# Patient Record
Sex: Female | Born: 1965 | Race: Black or African American | Hispanic: No | Marital: Single | Smoking: Never smoker
Health system: Southern US, Community
[De-identification: ages and names within clinical notes are randomized; demographics above are authoritative.]

## PROBLEM LIST (undated history)

## (undated) ENCOUNTER — Ambulatory Visit (HOSPITAL_COMMUNITY): Discharge status: Absent without leave | Payer: Medicaid Other

## (undated) DIAGNOSIS — E119 Type 2 diabetes mellitus without complications: Secondary | ICD-10-CM

## (undated) DIAGNOSIS — R079 Chest pain, unspecified: Secondary | ICD-10-CM

## (undated) DIAGNOSIS — N809 Endometriosis, unspecified: Secondary | ICD-10-CM

## (undated) DIAGNOSIS — K219 Gastro-esophageal reflux disease without esophagitis: Secondary | ICD-10-CM

## (undated) DIAGNOSIS — I10 Essential (primary) hypertension: Secondary | ICD-10-CM

## (undated) DIAGNOSIS — G43909 Migraine, unspecified, not intractable, without status migrainosus: Secondary | ICD-10-CM

## (undated) HISTORY — DX: Endometriosis, unspecified: N80.9

## (undated) HISTORY — DX: Migraine, unspecified, not intractable, without status migrainosus: G43.909

---

## 1997-05-29 ENCOUNTER — Ambulatory Visit (HOSPITAL_COMMUNITY): Admission: RE | Admit: 1997-05-29 | Discharge: 1997-05-29 | Payer: Self-pay | Admitting: *Deleted

## 1997-06-27 ENCOUNTER — Ambulatory Visit (HOSPITAL_COMMUNITY): Admission: RE | Admit: 1997-06-27 | Discharge: 1997-06-27 | Payer: Self-pay | Admitting: *Deleted

## 1997-08-16 ENCOUNTER — Inpatient Hospital Stay (HOSPITAL_COMMUNITY): Admission: AD | Admit: 1997-08-16 | Discharge: 1997-08-16 | Payer: Self-pay | Admitting: *Deleted

## 1997-09-04 ENCOUNTER — Inpatient Hospital Stay (HOSPITAL_COMMUNITY): Admission: AD | Admit: 1997-09-04 | Discharge: 1997-09-06 | Payer: Self-pay | Admitting: *Deleted

## 1999-05-12 ENCOUNTER — Encounter (INDEPENDENT_AMBULATORY_CARE_PROVIDER_SITE_OTHER): Payer: Self-pay | Admitting: *Deleted

## 1999-05-12 ENCOUNTER — Ambulatory Visit (HOSPITAL_BASED_OUTPATIENT_CLINIC_OR_DEPARTMENT_OTHER): Admission: RE | Admit: 1999-05-12 | Discharge: 1999-05-12 | Payer: Self-pay | Admitting: General Surgery

## 2000-06-29 ENCOUNTER — Encounter: Admission: RE | Admit: 2000-06-29 | Discharge: 2000-06-29 | Payer: Self-pay | Admitting: Family Medicine

## 2000-06-29 ENCOUNTER — Encounter: Payer: Self-pay | Admitting: Family Medicine

## 2000-07-06 ENCOUNTER — Inpatient Hospital Stay (HOSPITAL_COMMUNITY): Admission: AD | Admit: 2000-07-06 | Discharge: 2000-07-06 | Payer: Self-pay | Admitting: *Deleted

## 2000-10-11 ENCOUNTER — Inpatient Hospital Stay (HOSPITAL_COMMUNITY): Admission: AD | Admit: 2000-10-11 | Discharge: 2000-10-11 | Payer: Self-pay | Admitting: Obstetrics & Gynecology

## 2000-10-22 ENCOUNTER — Other Ambulatory Visit: Admission: RE | Admit: 2000-10-22 | Discharge: 2000-10-22 | Payer: Self-pay | Admitting: Obstetrics and Gynecology

## 2000-10-25 ENCOUNTER — Encounter: Payer: Self-pay | Admitting: Obstetrics and Gynecology

## 2000-10-25 ENCOUNTER — Ambulatory Visit (HOSPITAL_COMMUNITY): Admission: RE | Admit: 2000-10-25 | Discharge: 2000-10-25 | Payer: Self-pay | Admitting: Obstetrics and Gynecology

## 2000-11-22 ENCOUNTER — Encounter: Payer: Self-pay | Admitting: Obstetrics and Gynecology

## 2000-11-22 ENCOUNTER — Ambulatory Visit (HOSPITAL_COMMUNITY): Admission: RE | Admit: 2000-11-22 | Discharge: 2000-11-22 | Payer: Self-pay | Admitting: Obstetrics and Gynecology

## 2001-04-25 ENCOUNTER — Inpatient Hospital Stay (HOSPITAL_COMMUNITY): Admission: AD | Admit: 2001-04-25 | Discharge: 2001-04-27 | Payer: Self-pay | Admitting: Obstetrics and Gynecology

## 2001-12-24 ENCOUNTER — Encounter: Payer: Self-pay | Admitting: Emergency Medicine

## 2001-12-24 ENCOUNTER — Emergency Department (HOSPITAL_COMMUNITY): Admission: EM | Admit: 2001-12-24 | Discharge: 2001-12-24 | Payer: Self-pay | Admitting: Emergency Medicine

## 2002-05-09 ENCOUNTER — Other Ambulatory Visit: Admission: RE | Admit: 2002-05-09 | Discharge: 2002-05-09 | Payer: Self-pay | Admitting: Obstetrics and Gynecology

## 2002-08-22 ENCOUNTER — Ambulatory Visit (HOSPITAL_COMMUNITY): Admission: RE | Admit: 2002-08-22 | Discharge: 2002-08-22 | Payer: Self-pay | Admitting: Obstetrics and Gynecology

## 2002-08-22 ENCOUNTER — Encounter: Payer: Self-pay | Admitting: Obstetrics and Gynecology

## 2002-08-25 ENCOUNTER — Encounter: Payer: Self-pay | Admitting: Internal Medicine

## 2002-08-25 ENCOUNTER — Ambulatory Visit (HOSPITAL_COMMUNITY): Admission: RE | Admit: 2002-08-25 | Discharge: 2002-08-25 | Payer: Self-pay | Admitting: Internal Medicine

## 2002-10-17 ENCOUNTER — Encounter: Payer: Self-pay | Admitting: Obstetrics and Gynecology

## 2002-10-17 ENCOUNTER — Ambulatory Visit (HOSPITAL_COMMUNITY): Admission: RE | Admit: 2002-10-17 | Discharge: 2002-10-17 | Payer: Self-pay | Admitting: Obstetrics and Gynecology

## 2002-10-24 ENCOUNTER — Encounter: Payer: Self-pay | Admitting: Internal Medicine

## 2002-10-24 ENCOUNTER — Encounter: Admission: RE | Admit: 2002-10-24 | Discharge: 2002-10-24 | Payer: Self-pay | Admitting: Internal Medicine

## 2003-02-22 ENCOUNTER — Ambulatory Visit (HOSPITAL_COMMUNITY): Admission: RE | Admit: 2003-02-22 | Discharge: 2003-02-22 | Payer: Self-pay | Admitting: Obstetrics

## 2005-01-06 ENCOUNTER — Emergency Department (HOSPITAL_COMMUNITY): Admission: EM | Admit: 2005-01-06 | Discharge: 2005-01-06 | Payer: Self-pay | Admitting: Emergency Medicine

## 2005-01-07 ENCOUNTER — Emergency Department (HOSPITAL_COMMUNITY): Admission: EM | Admit: 2005-01-07 | Discharge: 2005-01-07 | Payer: Self-pay | Admitting: Emergency Medicine

## 2005-04-01 ENCOUNTER — Encounter: Admission: RE | Admit: 2005-04-01 | Discharge: 2005-04-01 | Payer: Self-pay | Admitting: Internal Medicine

## 2005-04-29 ENCOUNTER — Encounter: Admission: RE | Admit: 2005-04-29 | Discharge: 2005-04-29 | Payer: Self-pay | Admitting: Internal Medicine

## 2005-09-10 ENCOUNTER — Emergency Department (HOSPITAL_COMMUNITY): Admission: EM | Admit: 2005-09-10 | Discharge: 2005-09-10 | Payer: Self-pay | Admitting: Emergency Medicine

## 2006-01-04 ENCOUNTER — Emergency Department (HOSPITAL_COMMUNITY): Admission: EM | Admit: 2006-01-04 | Discharge: 2006-01-04 | Payer: Self-pay | Admitting: Emergency Medicine

## 2006-05-07 ENCOUNTER — Ambulatory Visit (HOSPITAL_COMMUNITY): Admission: RE | Admit: 2006-05-07 | Discharge: 2006-05-07 | Payer: Self-pay | Admitting: Neurology

## 2006-05-12 ENCOUNTER — Encounter: Admission: RE | Admit: 2006-05-12 | Discharge: 2006-05-12 | Payer: Self-pay | Admitting: Neurology

## 2007-04-28 ENCOUNTER — Encounter: Admission: RE | Admit: 2007-04-28 | Discharge: 2007-04-28 | Payer: Self-pay | Admitting: Family Medicine

## 2007-10-25 ENCOUNTER — Emergency Department (HOSPITAL_COMMUNITY): Admission: EM | Admit: 2007-10-25 | Discharge: 2007-10-25 | Payer: Self-pay | Admitting: Emergency Medicine

## 2007-11-21 ENCOUNTER — Emergency Department (HOSPITAL_COMMUNITY): Admission: EM | Admit: 2007-11-21 | Discharge: 2007-11-21 | Payer: Self-pay | Admitting: Emergency Medicine

## 2008-01-18 ENCOUNTER — Emergency Department (HOSPITAL_COMMUNITY): Admission: EM | Admit: 2008-01-18 | Discharge: 2008-01-18 | Payer: Self-pay | Admitting: Emergency Medicine

## 2008-02-07 ENCOUNTER — Emergency Department (HOSPITAL_COMMUNITY): Admission: EM | Admit: 2008-02-07 | Discharge: 2008-02-07 | Payer: Self-pay | Admitting: Family Medicine

## 2008-03-23 ENCOUNTER — Encounter: Admission: RE | Admit: 2008-03-23 | Discharge: 2008-03-23 | Payer: Self-pay | Admitting: Family Medicine

## 2008-04-13 ENCOUNTER — Emergency Department (HOSPITAL_COMMUNITY): Admission: EM | Admit: 2008-04-13 | Discharge: 2008-04-14 | Payer: Self-pay | Admitting: Emergency Medicine

## 2008-06-12 ENCOUNTER — Ambulatory Visit (HOSPITAL_COMMUNITY): Admission: RE | Admit: 2008-06-12 | Discharge: 2008-06-12 | Payer: Self-pay | Admitting: Family Medicine

## 2008-06-12 ENCOUNTER — Ambulatory Visit: Payer: Self-pay | Admitting: Vascular Surgery

## 2008-06-12 ENCOUNTER — Encounter (INDEPENDENT_AMBULATORY_CARE_PROVIDER_SITE_OTHER): Payer: Self-pay | Admitting: Family Medicine

## 2008-06-25 ENCOUNTER — Other Ambulatory Visit: Admission: RE | Admit: 2008-06-25 | Discharge: 2008-06-25 | Payer: Self-pay | Admitting: Family Medicine

## 2009-05-02 ENCOUNTER — Other Ambulatory Visit: Admission: RE | Admit: 2009-05-02 | Discharge: 2009-05-02 | Payer: Self-pay | Admitting: Family Medicine

## 2009-11-14 ENCOUNTER — Encounter: Admission: RE | Admit: 2009-11-14 | Discharge: 2009-11-14 | Payer: Self-pay | Admitting: Family Medicine

## 2010-01-28 ENCOUNTER — Ambulatory Visit (HOSPITAL_COMMUNITY): Admission: RE | Admit: 2010-01-28 | Payer: Self-pay | Source: Home / Self Care | Admitting: Obstetrics

## 2010-01-28 ENCOUNTER — Observation Stay (HOSPITAL_COMMUNITY)
Admission: EM | Admit: 2010-01-28 | Discharge: 2010-01-28 | Payer: Self-pay | Source: Home / Self Care | Admitting: Emergency Medicine

## 2010-03-01 ENCOUNTER — Encounter: Payer: Self-pay | Admitting: Obstetrics

## 2010-03-01 ENCOUNTER — Encounter: Payer: Self-pay | Admitting: Family Medicine

## 2010-03-02 ENCOUNTER — Encounter: Payer: Self-pay | Admitting: Family Medicine

## 2010-03-05 ENCOUNTER — Encounter
Admission: RE | Admit: 2010-03-05 | Discharge: 2010-03-11 | Payer: Self-pay | Source: Home / Self Care | Attending: Family Medicine | Admitting: Family Medicine

## 2010-03-27 ENCOUNTER — Ambulatory Visit: Payer: Self-pay

## 2010-04-03 ENCOUNTER — Ambulatory Visit: Payer: Self-pay

## 2010-04-17 ENCOUNTER — Ambulatory Visit: Payer: Self-pay

## 2010-04-21 LAB — CBC
HCT: 36 % (ref 36.0–46.0)
HCT: 37.2 % (ref 36.0–46.0)
Hemoglobin: 11.8 g/dL — ABNORMAL LOW (ref 12.0–15.0)
Hemoglobin: 12.5 g/dL (ref 12.0–15.0)
MCH: 27.3 pg (ref 26.0–34.0)
MCH: 28.2 pg (ref 26.0–34.0)
MCHC: 32.8 g/dL (ref 30.0–36.0)
MCHC: 33.6 g/dL (ref 30.0–36.0)
MCV: 83.3 fL (ref 78.0–100.0)
MCV: 84 fL (ref 78.0–100.0)
Platelets: 340 10*3/uL (ref 150–400)
Platelets: 368 10*3/uL (ref 150–400)
RBC: 4.32 MIL/uL (ref 3.87–5.11)
RBC: 4.43 MIL/uL (ref 3.87–5.11)
RDW: 14.3 % (ref 11.5–15.5)
RDW: 14.1 % (ref 11.5–15.5)
WBC: 6.3 10*3/uL (ref 4.0–10.5)
WBC: 7 10*3/uL (ref 4.0–10.5)

## 2010-04-21 LAB — POCT CARDIAC MARKERS
CKMB, poc: <1 ng/mL — ABNORMAL LOW (ref 1.0–8.0)
Myoglobin, poc: 76.4 ng/mL (ref 12–200)
Troponin i, poc: <0.05 ng/mL (ref 0.00–0.09)

## 2010-04-21 LAB — POCT I-STAT 3, VENOUS BLOOD GAS (G3P V)
Acid-Base Excess: 3 mmol/L — ABNORMAL HIGH (ref 0.0–2.0)
Bicarbonate: 27.6 mEq/L — ABNORMAL HIGH (ref 20.0–24.0)
O2 Saturation: 76 %
TCO2: 29 mmol/L (ref 0–100)
pCO2, Ven: 40.6 mmHg — ABNORMAL LOW (ref 45.0–50.0)
pH, Ven: 7.439 — ABNORMAL HIGH (ref 7.250–7.300)
pO2, Ven: 39 mmHg (ref 30.0–45.0)

## 2010-04-21 LAB — URINALYSIS, ROUTINE W REFLEX MICROSCOPIC
Glucose, UA: >1000 mg/dL — AB
Ketones, ur: 15 mg/dL — AB
Leukocytes, UA: NEGATIVE
Nitrite: NEGATIVE
Protein, ur: >300 mg/dL — AB
Specific Gravity, Urine: >1.046 — ABNORMAL HIGH (ref 1.005–1.030)
Urobilinogen, UA: 1 mg/dL (ref 0.0–1.0)
pH: 6 (ref 5.0–8.0)

## 2010-04-21 LAB — URINE MICROSCOPIC-ADD ON

## 2010-04-21 LAB — BASIC METABOLIC PANEL
BUN: 11 mg/dL (ref 6–23)
BUN: 7 mg/dL (ref 6–23)
BUN: 7 mg/dL (ref 6–23)
CO2: 26 mEq/L (ref 19–32)
CO2: 20 mEq/L (ref 19–32)
CO2: 26 mEq/L (ref 19–32)
Calcium: 9.4 mg/dL (ref 8.4–10.5)
Calcium: 8.4 mg/dL (ref 8.4–10.5)
Calcium: 9.2 mg/dL (ref 8.4–10.5)
Chloride: 91 mEq/L — ABNORMAL LOW (ref 96–112)
Chloride: 95 mEq/L — ABNORMAL LOW (ref 96–112)
Chloride: 99 mEq/L (ref 96–112)
Creatinine, Ser: 0.92 mg/dL (ref 0.4–1.2)
Creatinine, Ser: 0.72 mg/dL (ref 0.4–1.2)
Creatinine, Ser: 0.82 mg/dL (ref 0.4–1.2)
GFR calc Af Amer: >60 mL/min (ref >60)
GFR calc Af Amer: >60 mL/min (ref >60)
GFR calc Af Amer: >60 mL/min (ref >60)
GFR calc non Af Amer: >60 mL/min (ref >60)
GFR calc non Af Amer: >60 mL/min (ref >60)
GFR calc non Af Amer: >60 mL/min (ref >60)
Glucose, Bld: 446 mg/dL — ABNORMAL HIGH (ref 70–99)
Glucose, Bld: 338 mg/dL — ABNORMAL HIGH (ref 70–99)
Glucose, Bld: 444 mg/dL — ABNORMAL HIGH (ref 70–99)
Potassium: 3.8 mEq/L (ref 3.5–5.1)
Potassium: 3.7 mEq/L (ref 3.5–5.1)
Potassium: 4 mEq/L (ref 3.5–5.1)
Sodium: 128 mEq/L — ABNORMAL LOW (ref 135–145)
Sodium: 131 mEq/L — ABNORMAL LOW (ref 135–145)
Sodium: 131 mEq/L — ABNORMAL LOW (ref 135–145)

## 2010-04-21 LAB — DIFFERENTIAL
Basophils Absolute: 0 10*3/uL (ref 0.0–0.1)
Basophils Relative: 1 % (ref 0–1)
Eosinophils Absolute: 0.2 10*3/uL (ref 0.0–0.7)
Eosinophils Relative: 3 % (ref 0–5)
Lymphocytes Relative: 26 % (ref 12–46)
Lymphs Abs: 1.8 10*3/uL (ref 0.7–4.0)
Monocytes Absolute: 0.6 10*3/uL (ref 0.1–1.0)
Monocytes Relative: 8 % (ref 3–12)
Neutro Abs: 4.4 10*3/uL (ref 1.7–7.7)
Neutrophils Relative %: 63 % (ref 43–77)

## 2010-04-21 LAB — GLUCOSE, CAPILLARY
Glucose-Capillary: 231 mg/dL — ABNORMAL HIGH (ref 70–99)
Glucose-Capillary: 276 mg/dL — ABNORMAL HIGH (ref 70–99)
Glucose-Capillary: 343 mg/dL — ABNORMAL HIGH (ref 70–99)
Glucose-Capillary: 401 mg/dL — ABNORMAL HIGH (ref 70–99)
Glucose-Capillary: 459 mg/dL — ABNORMAL HIGH (ref 70–99)

## 2010-04-21 LAB — POCT PREGNANCY, URINE: Preg Test, Ur: NEGATIVE

## 2010-04-24 ENCOUNTER — Ambulatory Visit: Payer: Self-pay

## 2010-04-24 ENCOUNTER — Encounter: Payer: Medicaid Other | Attending: Family Medicine

## 2010-04-24 DIAGNOSIS — E119 Type 2 diabetes mellitus without complications: Secondary | ICD-10-CM | POA: Insufficient documentation

## 2010-04-24 DIAGNOSIS — Z713 Dietary counseling and surveillance: Secondary | ICD-10-CM | POA: Insufficient documentation

## 2010-05-01 ENCOUNTER — Ambulatory Visit: Payer: Self-pay

## 2010-05-22 LAB — DIFFERENTIAL
Basophils Absolute: 0 10*3/uL (ref 0.0–0.1)
Basophils Relative: 0 % (ref 0–1)
Eosinophils Absolute: 0.2 10*3/uL (ref 0.0–0.7)
Eosinophils Relative: 2 % (ref 0–5)
Lymphocytes Relative: 16 % (ref 12–46)
Lymphs Abs: 1.5 10*3/uL (ref 0.7–4.0)
Monocytes Absolute: 0.6 10*3/uL (ref 0.1–1.0)
Monocytes Relative: 7 % (ref 3–12)
Neutro Abs: 7 10*3/uL (ref 1.7–7.7)
Neutrophils Relative %: 75 % (ref 43–77)

## 2010-05-22 LAB — POCT I-STAT, CHEM 8
BUN: 7 mg/dL (ref 6–23)
Calcium, Ion: 1.12 mmol/L (ref 1.12–1.32)
Chloride: 101 mEq/L (ref 96–112)
Creatinine, Ser: 0.8 mg/dL (ref 0.4–1.2)
Glucose, Bld: 129 mg/dL — ABNORMAL HIGH (ref 70–99)
HCT: 34 % — ABNORMAL LOW (ref 36.0–46.0)
Hemoglobin: 11.6 g/dL — ABNORMAL LOW (ref 12.0–15.0)
Potassium: 3.5 mEq/L (ref 3.5–5.1)
Sodium: 137 mEq/L (ref 135–145)
TCO2: 25 mmol/L (ref 0–100)

## 2010-05-22 LAB — CBC
HCT: 31.4 % — ABNORMAL LOW (ref 36.0–46.0)
Hemoglobin: 10.8 g/dL — ABNORMAL LOW (ref 12.0–15.0)
MCHC: 34.5 g/dL (ref 30.0–36.0)
MCV: 85.5 fL (ref 78.0–100.0)
Platelets: 383 10*3/uL (ref 150–400)
RBC: 3.67 MIL/uL — ABNORMAL LOW (ref 3.87–5.11)
RDW: 16.2 % — ABNORMAL HIGH (ref 11.5–15.5)
WBC: 9.3 10*3/uL (ref 4.0–10.5)

## 2010-05-22 LAB — CULTURE, ROUTINE-ABSCESS

## 2010-06-27 NOTE — H&P (Signed)
[facility]  Patient:    JASMAINE, ROCHEL Visit Number: [id number] MRN: [id number]          Service Type: OBS Location: [id number] Attending Physician:  Leonard Schwartz Dictated by:   Mack Guise, C.N.M. Admit Date:  04/25/2001                           History and Physical  HISTORY OF PRESENT ILLNESS:   Ms. Heney is a 45 year old gravida 4 para 3-0-0-3 at 62 and two-sevenths weeks, EDD April 23, 2001, who presents with increasing labor symptoms.  She reports positive fetal movement, no bleeding, no rupture of membranes.  Denies any headache, visual changes, or epigastric pain.  Her pregnancy has been followed by the CNM service at [facility] and is remarkable for: 1. Obesity. 2. History of postpartum hemorrhage with her first pregnancy. 3. History of increased blood pressure with her blood pressure. 4. Questionable LMP. 5. Positive group B strep.  This patient was initially evaluated at the office of [facility] on October 22, 2001 at approximately [redacted] weeks gestation.  EDC determined by 14-week ultrasound and confirmed with follow-up.  PRENATAL LABORATORY DATA:     Hemoglobin and hematocrit 10.9 and 33.9; platelets 343,000.  Blood type and Rh A positive, antibody screen negative. Sickle cell trait negative.  VDRL nonreactive.  Rubella immune.  Hepatitis B surface antigen negative.  HIV nonreactive.  Pap smear within normal limits. GC and chlamydia negative.  At 28 weeks, one-hour glucose challenge 103; hemoglobin 10.0.  At 36 weeks, culture of the vaginal tract is positive for group B strep.  The patients pregnancy has been essentially unremarkable.  She has been normotensive with no proteinuria.  OBSTETRICAL HISTORY:          In 1986, the patient had a normal spontaneous vaginal delivery with the birth of a 7 pound 14 ounce female infant at term. She had a postpartum hemorrhage and a blood transfusion following that delivery.  In  1988 patient had a normal spontaneous vaginal delivery with the birth of a 7 pound 15 ounce female infant at term with no complications.  In 1999 patient had a normal spontaneous vaginal delivery at term with the birth of a 7 pound 3 ounce female infant with no complications, and the present pregnancy.  MEDICAL HISTORY:              Patient with a history of hypertension in 1998 with pregnancy.  Has not had any further problems with her blood pressure.  SURGICAL HISTORY:             D&C and wisdom teeth.  FAMILY HISTORY:               Maternal grandmother - MI.  Mother with heart disease and bypass surgery.  Maternal grandfather with heart disease - deceased.  Patients mother and father with history of chronic hypertension. Maternal aunt with emphysema.  Patients mother and maternal aunts and maternal grandfather with diabetes.  Patients maternal first cousin with renal disease and liver disease.  GENETIC HISTORY:              Patients maternal first cousin born with liver problems.  Otherwise, there is no family history of familial or genetic disorders, children that died in infancy or that were born with birth defects.  SOCIAL HISTORY:               Ms.  Markoff is a 45 year old African-American female.  The father of the baby, Vivia Budge, is involved and supportive. They are of the [employer].  REVIEW OF SYSTEMS:            There are no signs or symptoms suggestive of focal or systemic disease and the patient is typical of one with a uterine pregnancy at term in early labor.  PHYSICAL EXAMINATION:  VITAL SIGNS:                  Stable, afebrile.  HEENT:                        Unremarkable.  HEART:                        Regular rate and rhythm.  LUNGS:                        Clear.  ABDOMEN:                      Gravid in its contour.  Uterine fundus is noted to extend 40 above the level of the pubic symphysis.  Leopolds maneuvers find the infant to be in a  longitudinal lie, cephalic presentation, and the estimated fetal weight is 7.5 pounds.  PELVIC:                       Digital exam of the cervix finds it to be 4-5 cm dilated, 90% effaced, with the cephalic presenting part at a -1 station and a bulging bag of water.  EXTREMITIES:                  Show no pathologic edema.  DTRs are 1+ with no clonus.  ASSESSMENT:                   1. Intrauterine pregnancy at term.                               2. Early labor.  PLAN:                         1. Admit per Dr. Marline Backbone.                               2. Routine CNM orders.                               3. Start Penicillin G prophylaxis for group B                                  strep. Dictated by:   Mack Guise, C.N.M. Attending Physician:  Leonard Schwartz DD:  04/25/01 TD:  04/25/01 Job: [id number]

## 2010-07-09 ENCOUNTER — Emergency Department (HOSPITAL_COMMUNITY): Payer: Medicaid Other

## 2010-07-09 ENCOUNTER — Emergency Department (HOSPITAL_COMMUNITY)
Admission: EM | Admit: 2010-07-09 | Discharge: 2010-07-09 | Disposition: A | Discharge status: Home / Self Care | Payer: Medicaid Other | Attending: Emergency Medicine | Admitting: Emergency Medicine

## 2010-07-09 DIAGNOSIS — M549 Dorsalgia, unspecified: Secondary | ICD-10-CM | POA: Insufficient documentation

## 2010-07-09 DIAGNOSIS — N3090 Cystitis, unspecified without hematuria: Secondary | ICD-10-CM | POA: Insufficient documentation

## 2010-07-09 DIAGNOSIS — R109 Unspecified abdominal pain: Secondary | ICD-10-CM | POA: Insufficient documentation

## 2010-07-09 DIAGNOSIS — N72 Inflammatory disease of cervix uteri: Secondary | ICD-10-CM | POA: Insufficient documentation

## 2010-07-09 LAB — URINALYSIS, ROUTINE W REFLEX MICROSCOPIC
Bilirubin Urine: NEGATIVE
Glucose, UA: NEGATIVE mg/dL
Ketones, ur: NEGATIVE mg/dL
Nitrite: NEGATIVE
Protein, ur: NEGATIVE mg/dL
Specific Gravity, Urine: 1.014 (ref 1.005–1.030)
Urobilinogen, UA: 0.2 mg/dL (ref 0.0–1.0)
pH: 5.5 (ref 5.0–8.0)

## 2010-07-09 LAB — DIFFERENTIAL
Basophils Absolute: 0 10*3/uL (ref 0.0–0.1)
Basophils Relative: 0 % (ref 0–1)
Eosinophils Absolute: 0.2 10*3/uL (ref 0.0–0.7)
Eosinophils Relative: 2 % (ref 0–5)
Lymphocytes Relative: 32 % (ref 12–46)
Lymphs Abs: 2.6 10*3/uL (ref 0.7–4.0)
Monocytes Absolute: 0.6 10*3/uL (ref 0.1–1.0)
Monocytes Relative: 8 % (ref 3–12)
Neutro Abs: 4.6 10*3/uL (ref 1.7–7.7)
Neutrophils Relative %: 57 % (ref 43–77)

## 2010-07-09 LAB — POCT I-STAT, CHEM 8
BUN: 11 mg/dL (ref 6–23)
Calcium, Ion: 1.18 mmol/L (ref 1.12–1.32)
Chloride: 101 mEq/L (ref 96–112)
Creatinine, Ser: 0.9 mg/dL (ref 0.4–1.2)
Glucose, Bld: 100 mg/dL — ABNORMAL HIGH (ref 70–99)
HCT: 39 % (ref 36.0–46.0)
Hemoglobin: 13.3 g/dL (ref 12.0–15.0)
Potassium: 3.7 mEq/L (ref 3.5–5.1)
Sodium: 137 mEq/L (ref 135–145)
TCO2: 25 mmol/L (ref 0–100)

## 2010-07-09 LAB — CBC
HCT: 36.2 % (ref 36.0–46.0)
Hemoglobin: 12.2 g/dL (ref 12.0–15.0)
MCH: 28.4 pg (ref 26.0–34.0)
MCHC: 33.7 g/dL (ref 30.0–36.0)
MCV: 84.2 fL (ref 78.0–100.0)
Platelets: 389 10*3/uL (ref 150–400)
RBC: 4.3 MIL/uL (ref 3.87–5.11)
RDW: 14.1 % (ref 11.5–15.5)
WBC: 8.1 10*3/uL (ref 4.0–10.5)

## 2010-07-09 LAB — URINE MICROSCOPIC-ADD ON

## 2010-07-09 LAB — WET PREP, GENITAL
Trich, Wet Prep: NONE SEEN
Yeast Wet Prep HPF POC: NONE SEEN

## 2010-07-09 LAB — PREGNANCY, URINE: Preg Test, Ur: NEGATIVE

## 2010-07-10 LAB — GC/CHLAMYDIA PROBE AMP, GENITAL
Chlamydia, DNA Probe: NEGATIVE
GC Probe Amp, Genital: NEGATIVE

## 2010-07-11 LAB — URINE CULTURE
Colony Count: >=100000
Culture  Setup Time: 201205302034

## 2010-11-14 LAB — POCT RAPID STREP A: Streptococcus, Group A Screen (Direct): POSITIVE — AB

## 2010-12-08 ENCOUNTER — Other Ambulatory Visit: Payer: Self-pay | Admitting: Family Medicine

## 2010-12-08 DIAGNOSIS — Z1231 Encounter for screening mammogram for malignant neoplasm of breast: Secondary | ICD-10-CM

## 2010-12-20 ENCOUNTER — Encounter (HOSPITAL_COMMUNITY): Payer: Self-pay

## 2010-12-20 ENCOUNTER — Emergency Department (INDEPENDENT_AMBULATORY_CARE_PROVIDER_SITE_OTHER)
Admission: EM | Admit: 2010-12-20 | Discharge: 2010-12-20 | Disposition: A | Discharge status: Home / Self Care | Payer: Medicaid Other | Source: Home / Self Care

## 2010-12-20 DIAGNOSIS — T148XXA Other injury of unspecified body region, initial encounter: Secondary | ICD-10-CM

## 2010-12-20 HISTORY — DX: Essential (primary) hypertension: I10

## 2010-12-20 LAB — POCT URINALYSIS DIP (DEVICE)
Bilirubin Urine: NEGATIVE
Glucose, UA: NEGATIVE mg/dL
Hgb urine dipstick: NEGATIVE
Leukocytes, UA: NEGATIVE
Nitrite: NEGATIVE
Protein, ur: 30 mg/dL — AB
Specific Gravity, Urine: >=1.03 (ref 1.005–1.030)
Urobilinogen, UA: 0.2 mg/dL (ref 0.0–1.0)
pH: 5.5 (ref 5.0–8.0)

## 2010-12-20 LAB — POCT I-STAT, CHEM 8
BUN: 12 mg/dL (ref 6–23)
Calcium, Ion: 1.17 mmol/L (ref 1.12–1.32)
Chloride: 99 mEq/L (ref 96–112)
Creatinine, Ser: 0.9 mg/dL (ref 0.50–1.10)
Glucose, Bld: 143 mg/dL — ABNORMAL HIGH (ref 70–99)
HCT: 42 % (ref 36.0–46.0)
Hemoglobin: 14.3 g/dL (ref 12.0–15.0)
Potassium: 3.7 mEq/L (ref 3.5–5.1)
Sodium: 137 mEq/L (ref 135–145)
TCO2: 25 mmol/L (ref 0–100)

## 2010-12-20 LAB — POCT PREGNANCY, URINE: Preg Test, Ur: NEGATIVE

## 2010-12-20 MED ORDER — CYCLOBENZAPRINE HCL 10 MG PO TABS: 10.0000 mg | ORAL_TABLET | Freq: Three times a day (TID) | ORAL | Status: AC | PRN

## 2010-12-20 MED ORDER — HYDROCODONE-ACETAMINOPHEN 5-500 MG PO TABS: 1.0000 | ORAL_TABLET | Freq: Four times a day (QID) | ORAL | Status: AC | PRN

## 2010-12-20 NOTE — Discharge Instructions (Signed)
Your urine test and blood test are not indicative of infection.  You blood sugar is not at goal but not critically high here. You do have some signs of dehydration in your urine and is very likely that your bacj pain is related to muscle squeletal problem like muscle spasms or sprain related to your job. Keep well hydrated. Take the prescribed medications as instructed. Follow up with your primary doctor as scheduled next Wednesday to monitor your symptoms and diabetes. Return here as needed.

## 2010-12-20 NOTE — ED Provider Notes (Signed)
History     CSN: [id number] Arrival date & time: 12/20/2010 10:39 AM   First MD Initiated Contact with Patient 12/20/10 1120      Chief Complaint  Patient presents with  . Urinary Tract Infection    (Consider location/radiation/quality/duration/timing/severity/associated sxs/prior treatment) Patient is a 45 y.o. female presenting with flank pain.  Flank Pain This is a new problem. The current episode started more than 2 days ago (Sugars have been running in the 200's fasting has been drinking more fluids  has had right flank pain in last 4 days thinks she might have an UTI). The problem occurs constantly. The problem has not changed since onset.Pertinent negatives include no chest pain, no abdominal pain, no headaches and no shortness of breath. Associated symptoms comments: No dysuria or hematuria. No fever or chills. No headache, no diarrhea, some nausea no vomiting.. 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    Past Medical History  Diagnosis Date  . Diabetes mellitus   . Arthritis   . Hypertension     History reviewed. No pertinent past surgical history.  Family History  Problem Relation Age of Onset  . Diabetes Mother   . Hypertension Mother     History  Substance Use Topics  . Smoking status: Never Smoker   . Smokeless tobacco: Not on file  . Alcohol Use: No    OB History    Grav Para Term Preterm Abortions TAB SAB Ect Mult Living   4 4              Review of Systems  Constitutional: Negative for fever, chills and appetite change.  Respiratory: Negative for cough, chest tightness and shortness of breath.   Cardiovascular: Negative for chest pain and leg swelling.  Gastrointestinal: Positive for nausea. Negative for abdominal pain.  Genitourinary: Positive for frequency and flank pain. Negative for dysuria, urgency, hematuria, vaginal discharge, difficulty urinating and pelvic pain.    Musculoskeletal: Positive for back pain.  Skin: Negative for rash.  Neurological: Negative for headaches.    Allergies  Review of patient's allergies indicates no known allergies.  Home Medications   Current Outpatient Rx  Name Route Sig Dispense Refill  . ALBUTEROL SULFATE IN Inhalation Inhale 2 puffs into the lungs 4 (four) times daily as needed.      Marland Kitchen ESOMEPRAZOLE MAGNESIUM 40 MG PO PACK Oral Take 40 mg by mouth daily before breakfast.      . HYDROCHLOROTHIAZIDE 12.5 MG PO CAPS Oral Take 12.5 mg by mouth daily.      Marland Kitchen METFORMIN HCL 850 MG PO TABS Oral Take 850 mg by mouth 2 (two) times daily with a meal.      . SITAGLIPTIN-METFORMIN HCL [phone] MG PO TB24 Oral Take 100 mg by mouth 1 day or 1 dose.      . CYCLOBENZAPRINE HCL 10 MG PO TABS Oral Take 1 tablet (10 mg total) by mouth 3 (three) times daily as needed for muscle spasms. 30 tablet 0  . HYDROCODONE-ACETAMINOPHEN 5-500 MG PO TABS Oral Take 1-2 tablets by mouth every 6 (six) hours as needed for pain. 15 tablet 0    BP 134/97  Pulse 97  Temp(Src) 97.4 F (36.3 C) (Oral)  Resp 18  SpO2 97%  LMP 11/14/2010  Physical Exam  Constitutional: She is oriented to person, place, and time. She appears well-developed and well-nourished. No distress.       Morbidly  obese  Neck: No JVD present.  Cardiovascular: Normal rate and regular rhythm.   Pulmonary/Chest: Effort normal and breath sounds normal. No respiratory distress. She has no wheezes. She has no rales. She exhibits no tenderness.  Abdominal: Soft. Bowel sounds are normal. She exhibits no distension. There is no tenderness.  Musculoskeletal:       Left lumbar paravertebral area with increased tone and diffusely tender to palpation. No crepitus. No skin changes in tender area. Limited flexion an extension of the back due to body habitus plus reported discomfort with movent .  Lymphadenopathy:    She has no cervical adenopathy.  Neurological: She is alert and oriented to  person, place, and time.    ED Course  Procedures (including critical care time)  Labs Reviewed  POCT URINALYSIS DIP (DEVICE) - Abnormal; Notable for the following:    Ketones, ur TRACE (*)    Protein, ur 30 (*)    All other components within normal limits  POCT I-STAT, CHEM 8 - Abnormal; Notable for the following:    Glucose, Bld 143 (*)    All other components within normal limits  POCT PREGNANCY, URINE   No results found.   1. Muscle strain       MDM          Janisse Ghan Moreno-Coll 12/20/10 2250

## 2010-12-20 NOTE — ED Notes (Signed)
Pt complains of left flank pain and frequency.  Pt states she has frequent UTI's when her sugar is elevated.  Pt has appointment with [facility] on 12/24/10 to discuss elevated sugar

## 2011-01-06 ENCOUNTER — Ambulatory Visit: Payer: Medicaid Other

## 2011-03-30 ENCOUNTER — Ambulatory Visit: Payer: Medicaid Other

## 2011-04-01 ENCOUNTER — Ambulatory Visit: Payer: Medicaid Other

## 2011-04-15 ENCOUNTER — Other Ambulatory Visit: Payer: Self-pay | Admitting: Family Medicine

## 2011-04-15 ENCOUNTER — Ambulatory Visit
Admission: RE | Admit: 2011-04-15 | Discharge: 2011-04-15 | Disposition: A | Discharge status: Home / Self Care | Payer: Medicaid Other | Source: Ambulatory Visit | Attending: Family Medicine | Admitting: Family Medicine

## 2011-04-15 DIAGNOSIS — Z1231 Encounter for screening mammogram for malignant neoplasm of breast: Secondary | ICD-10-CM

## 2011-11-26 ENCOUNTER — Other Ambulatory Visit: Payer: Self-pay | Admitting: Obstetrics & Gynecology

## 2011-11-26 DIAGNOSIS — N946 Dysmenorrhea, unspecified: Secondary | ICD-10-CM

## 2011-12-02 ENCOUNTER — Ambulatory Visit (HOSPITAL_COMMUNITY)
Admission: RE | Admit: 2011-12-02 | Discharge: 2011-12-02 | Disposition: A | Discharge status: Home / Self Care | Payer: Medicaid Other | Source: Ambulatory Visit | Attending: Obstetrics & Gynecology | Admitting: Obstetrics & Gynecology

## 2011-12-02 DIAGNOSIS — N949 Unspecified condition associated with female genital organs and menstrual cycle: Secondary | ICD-10-CM | POA: Insufficient documentation

## 2011-12-02 DIAGNOSIS — N946 Dysmenorrhea, unspecified: Secondary | ICD-10-CM | POA: Insufficient documentation

## 2011-12-08 ENCOUNTER — Other Ambulatory Visit: Payer: Self-pay | Admitting: Obstetrics & Gynecology

## 2011-12-08 DIAGNOSIS — R102 Pelvic and perineal pain: Secondary | ICD-10-CM

## 2011-12-09 ENCOUNTER — Encounter (HOSPITAL_COMMUNITY): Payer: Self-pay | Admitting: Pharmacist

## 2011-12-11 ENCOUNTER — Encounter (HOSPITAL_COMMUNITY): Payer: Self-pay

## 2011-12-11 ENCOUNTER — Encounter (HOSPITAL_COMMUNITY)
Admission: RE | Admit: 2011-12-11 | Discharge: 2011-12-11 | Disposition: A | Discharge status: Home / Self Care | Payer: Medicaid Other | Source: Ambulatory Visit | Attending: Obstetrics & Gynecology | Admitting: Obstetrics & Gynecology

## 2011-12-11 HISTORY — DX: Gastro-esophageal reflux disease without esophagitis: K21.9

## 2011-12-11 LAB — CBC
HCT: 32.9 % — ABNORMAL LOW (ref 36.0–46.0)
Hemoglobin: 10.9 g/dL — ABNORMAL LOW (ref 12.0–15.0)
MCH: 29.3 pg (ref 26.0–34.0)
MCHC: 33.1 g/dL (ref 30.0–36.0)
MCV: 88.4 fL (ref 78.0–100.0)
Platelets: 438 10*3/uL — ABNORMAL HIGH (ref 150–400)
RBC: 3.72 MIL/uL — ABNORMAL LOW (ref 3.87–5.11)
RDW: 14.7 % (ref 11.5–15.5)
WBC: 11.2 10*3/uL — ABNORMAL HIGH (ref 4.0–10.5)

## 2011-12-11 LAB — BASIC METABOLIC PANEL
BUN: 9 mg/dL (ref 6–23)
CO2: 25 mEq/L (ref 19–32)
Calcium: 9.5 mg/dL (ref 8.4–10.5)
Chloride: 98 mEq/L (ref 96–112)
Creatinine, Ser: 0.71 mg/dL (ref 0.50–1.10)
GFR calc Af Amer: >90 mL/min (ref >90)
GFR calc non Af Amer: >90 mL/min (ref >90)
Glucose, Bld: 96 mg/dL (ref 70–99)
Potassium: 3.6 mEq/L (ref 3.5–5.1)
Sodium: 133 mEq/L — ABNORMAL LOW (ref 135–145)

## 2011-12-11 NOTE — Patient Instructions (Addendum)
20 Sarah Goodman  12/11/2011   Your procedure is scheduled on:  12/18/11  Enter through the Main Entrance of [facility] at  12:45  Pick up the phone at the desk and dial [phone].   Call this number if you have problems the morning of surgery: [phone]   Remember:   Do not eat food:After Midnight.  Do not drink clear liquids: 4 Hours before arrival.  Take these medicines the morning of surgery with A SIP OF WATER: Hold diabetic medications day of surgery, bring inhaler, take Nexium morning of surgery.   Do not wear jewelry, make-up or nail polish.  Do not wear lotions, powders, or perfumes. You may wear deodorant.  Do not shave 48 hours prior to surgery.  Do not bring valuables to the hospital.  Contacts, dentures or bridgework may not be worn into surgery.  Leave suitcase in the car. After surgery it may be brought to your room.  For patients admitted to the hospital, checkout time is 11:00 AM the day of discharge.   Patients discharged the day of surgery will not be allowed to drive home.  Name and phone number of your driver: daughter  Special Instructions: Shower using CHG 2 nights before surgery and the night before surgery.  If you shower the day of surgery use CHG.  Use special wash - you have one bottle of CHG for all showers.  You should use approximately 1/3 of the bottle for each shower.   Please read over the following fact sheets that you were given: Surgical Site Infection Prevention

## 2011-12-18 ENCOUNTER — Encounter (HOSPITAL_COMMUNITY): Payer: Self-pay | Admitting: Obstetrics & Gynecology

## 2011-12-18 ENCOUNTER — Encounter (HOSPITAL_COMMUNITY)
Admission: RE | Disposition: A | Discharge status: Home / Self Care | Payer: Self-pay | Source: Ambulatory Visit | Attending: Obstetrics & Gynecology

## 2011-12-18 ENCOUNTER — Encounter (HOSPITAL_COMMUNITY): Payer: Self-pay | Admitting: Anesthesiology

## 2011-12-18 ENCOUNTER — Ambulatory Visit (HOSPITAL_COMMUNITY): Payer: Medicaid Other | Admitting: Anesthesiology

## 2011-12-18 ENCOUNTER — Ambulatory Visit (HOSPITAL_COMMUNITY)
Admission: RE | Admit: 2011-12-18 | Discharge: 2011-12-18 | Disposition: A | Discharge status: Home / Self Care | Payer: Medicaid Other | Source: Ambulatory Visit | Attending: Obstetrics & Gynecology | Admitting: Obstetrics & Gynecology

## 2011-12-18 DIAGNOSIS — N938 Other specified abnormal uterine and vaginal bleeding: Secondary | ICD-10-CM | POA: Insufficient documentation

## 2011-12-18 DIAGNOSIS — N939 Abnormal uterine and vaginal bleeding, unspecified: Secondary | ICD-10-CM | POA: Diagnosis present

## 2011-12-18 DIAGNOSIS — N925 Other specified irregular menstruation: Secondary | ICD-10-CM | POA: Insufficient documentation

## 2011-12-18 DIAGNOSIS — IMO0002 Reserved for concepts with insufficient information to code with codable children: Secondary | ICD-10-CM | POA: Diagnosis present

## 2011-12-18 DIAGNOSIS — R87612 Low grade squamous intraepithelial lesion on cytologic smear of cervix (LGSIL): Secondary | ICD-10-CM | POA: Insufficient documentation

## 2011-12-18 DIAGNOSIS — N949 Unspecified condition associated with female genital organs and menstrual cycle: Secondary | ICD-10-CM | POA: Insufficient documentation

## 2011-12-18 HISTORY — PX: HYSTEROSCOPY WITH D & C: SHX1775

## 2011-12-18 HISTORY — PX: COLPOSCOPY: SHX161

## 2011-12-18 LAB — GLUCOSE, CAPILLARY: Glucose-Capillary: 98 mg/dL (ref 70–99)

## 2011-12-18 SURGERY — DILATATION AND CURETTAGE /HYSTEROSCOPY
Anesthesia: General | Site: Vagina | Wound class: Clean Contaminated

## 2011-12-18 MED ORDER — FENTANYL CITRATE 0.05 MG/ML IJ SOLN
INTRAMUSCULAR | Status: DC | PRN
  Administered 2011-12-18: 25 ug via INTRAVENOUS
  Administered 2011-12-18: 50 ug via INTRAVENOUS
  Administered 2011-12-18: 25 ug via INTRAVENOUS
  Administered 2011-12-18: 50 ug via INTRAVENOUS

## 2011-12-18 MED ORDER — MIDAZOLAM HCL 2 MG/2ML IJ SOLN: 0.5000 mg | Freq: Once | INTRAMUSCULAR | Status: DC | PRN

## 2011-12-18 MED ORDER — KETOROLAC TROMETHAMINE 30 MG/ML IJ SOLN
INTRAMUSCULAR | Status: DC | PRN
  Administered 2011-12-18: 30 mg via INTRAVENOUS

## 2011-12-18 MED ORDER — LIDOCAINE HCL (CARDIAC) 20 MG/ML IV SOLN
INTRAVENOUS | Status: DC | PRN
  Administered 2011-12-18: 80 mg via INTRAVENOUS

## 2011-12-18 MED ORDER — ONDANSETRON HCL 4 MG/2ML IJ SOLN
INTRAMUSCULAR | Status: DC | PRN
  Administered 2011-12-18: 4 mg via INTRAVENOUS

## 2011-12-18 MED ORDER — ONDANSETRON HCL 4 MG/2ML IJ SOLN: 4.0000 mg | Freq: Four times a day (QID) | INTRAMUSCULAR | Status: DC | PRN

## 2011-12-18 MED ORDER — OXYCODONE-ACETAMINOPHEN 5-325 MG PO TABS
ORAL_TABLET | ORAL | Status: AC
  Administered 2011-12-18: 1
  Filled 2011-12-18: qty 1

## 2011-12-18 MED ORDER — SODIUM CHLORIDE 0.9 % IJ SOLN: 3.0000 mL | INTRAMUSCULAR | Status: DC | PRN

## 2011-12-18 MED ORDER — FENTANYL CITRATE 0.05 MG/ML IJ SOLN
25.0000 ug | INTRAMUSCULAR | Status: DC | PRN
  Administered 2011-12-18 (×2): 50 ug via INTRAVENOUS

## 2011-12-18 MED ORDER — PROMETHAZINE HCL 25 MG/ML IJ SOLN: 6.2500 mg | INTRAMUSCULAR | Status: DC | PRN

## 2011-12-18 MED ORDER — FENTANYL CITRATE 0.05 MG/ML IJ SOLN
INTRAMUSCULAR | Status: AC
  Filled 2011-12-18: qty 2

## 2011-12-18 MED ORDER — KETOROLAC TROMETHAMINE 30 MG/ML IJ SOLN: 15.0000 mg | Freq: Once | INTRAMUSCULAR | Status: DC | PRN

## 2011-12-18 MED ORDER — PROPOFOL 10 MG/ML IV EMUL
INTRAVENOUS | Status: AC
  Filled 2011-12-18: qty 20

## 2011-12-18 MED ORDER — ACETAMINOPHEN 650 MG RE SUPP
650.0000 mg | RECTAL | Status: DC | PRN
  Filled 2011-12-18: qty 1

## 2011-12-18 MED ORDER — DEXAMETHASONE SODIUM PHOSPHATE 10 MG/ML IJ SOLN
INTRAMUSCULAR | Status: AC
  Filled 2011-12-18: qty 1

## 2011-12-18 MED ORDER — GLYCOPYRROLATE 0.2 MG/ML IJ SOLN
INTRAMUSCULAR | Status: DC | PRN
  Administered 2011-12-18: 0.1 mg via INTRAVENOUS

## 2011-12-18 MED ORDER — IODINE STRONG (LUGOLS) 5 % PO SOLN
ORAL | Status: DC | PRN
  Administered 2011-12-18: 0.1 mL via ORAL

## 2011-12-18 MED ORDER — LIDOCAINE HCL (CARDIAC) 20 MG/ML IV SOLN
INTRAVENOUS | Status: AC
  Filled 2011-12-18: qty 5

## 2011-12-18 MED ORDER — MEPERIDINE HCL 25 MG/ML IJ SOLN: 6.2500 mg | INTRAMUSCULAR | Status: DC | PRN

## 2011-12-18 MED ORDER — LACTATED RINGERS IV SOLN
INTRAVENOUS | Status: DC
  Administered 2011-12-18: 15:00:00 via INTRAVENOUS
  Administered 2011-12-18: 50 mL/h via INTRAVENOUS

## 2011-12-18 MED ORDER — GLYCOPYRROLATE 0.2 MG/ML IJ SOLN
INTRAMUSCULAR | Status: AC
  Filled 2011-12-18: qty 1

## 2011-12-18 MED ORDER — PROPOFOL 10 MG/ML IV EMUL
INTRAVENOUS | Status: DC | PRN
  Administered 2011-12-18: 200 mg via INTRAVENOUS

## 2011-12-18 MED ORDER — OXYCODONE HCL 5 MG PO TABS
5.0000 mg | ORAL_TABLET | ORAL | Status: DC | PRN
  Filled 2011-12-18: qty 2

## 2011-12-18 MED ORDER — ONDANSETRON HCL 4 MG/2ML IJ SOLN
INTRAMUSCULAR | Status: AC
  Filled 2011-12-18: qty 2

## 2011-12-18 MED ORDER — SODIUM CHLORIDE 0.9 % IJ SOLN: 3.0000 mL | Freq: Two times a day (BID) | INTRAMUSCULAR | Status: DC

## 2011-12-18 MED ORDER — FERRIC SUBSULFATE SOLN
Status: DC | PRN
  Administered 2011-12-18: 1 via TOPICAL

## 2011-12-18 MED ORDER — SODIUM CHLORIDE 0.9 % IV SOLN: 250.0000 mL | INTRAVENOUS | Status: DC | PRN

## 2011-12-18 MED ORDER — GLYCINE 1.5 % IR SOLN
Status: DC | PRN
  Administered 2011-12-18: 1

## 2011-12-18 MED ORDER — FENTANYL CITRATE 0.05 MG/ML IJ SOLN
INTRAMUSCULAR | Status: AC
  Administered 2011-12-18: 50 ug via INTRAVENOUS
  Filled 2011-12-18: qty 2

## 2011-12-18 MED ORDER — OXYCODONE-ACETAMINOPHEN 5-325 MG PO TABS: 2.0000 | ORAL_TABLET | Freq: Four times a day (QID) | ORAL | Status: DC | PRN

## 2011-12-18 MED ORDER — MIDAZOLAM HCL 2 MG/2ML IJ SOLN
INTRAMUSCULAR | Status: AC
  Filled 2011-12-18: qty 2

## 2011-12-18 MED ORDER — MIDAZOLAM HCL 5 MG/5ML IJ SOLN
INTRAMUSCULAR | Status: DC | PRN
  Administered 2011-12-18 (×2): 1 mg via INTRAVENOUS

## 2011-12-18 MED ORDER — ACETAMINOPHEN 325 MG PO TABS: 650.0000 mg | ORAL_TABLET | ORAL | Status: DC | PRN

## 2011-12-18 MED ORDER — ACETIC ACID 4% SOLUTION
Status: DC | PRN
  Administered 2011-12-18: 1 via TOPICAL

## 2011-12-18 MED ORDER — LIDOCAINE HCL 1 % IJ SOLN
INTRAMUSCULAR | Status: DC | PRN
  Administered 2011-12-18: 10 mL

## 2011-12-18 SURGICAL SUPPLY — 19 items
APPLICATOR COTTON TIP 6IN STRL (MISCELLANEOUS) IMPLANT
CANISTER SUCTION 2500CC (MISCELLANEOUS) ×3 IMPLANT
CATH ROBINSON RED A/P 16FR (CATHETERS) ×3 IMPLANT
CLOTH BEACON ORANGE TIMEOUT ST (SAFETY) ×3 IMPLANT
CONTAINER PREFILL 10% NBF 15ML (MISCELLANEOUS) ×6 IMPLANT
CONTAINER PREFILL 10% NBF 60ML (FORM) ×6 IMPLANT
DRESSING TELFA 8X3 (GAUZE/BANDAGES/DRESSINGS) ×3 IMPLANT
GLOVE BIO SURGEON STRL SZ 6.5 (GLOVE) ×6 IMPLANT
GOWN PREVENTION PLUS LG XLONG (DISPOSABLE) ×6 IMPLANT
NDL SPNL 20GX3.5 QUINCKE YW (NEEDLE) IMPLANT
NEEDLE SPNL 20GX3.5 QUINCKE YW (NEEDLE) ×3 IMPLANT
NS IRRIG 1000ML POUR BTL (IV SOLUTION) ×3 IMPLANT
PACK HYSTEROSCOPY LF (CUSTOM PROCEDURE TRAY) ×3 IMPLANT
PACK VAGINAL MINOR WOMEN LF (CUSTOM PROCEDURE TRAY) ×3 IMPLANT
PAD OB MATERNITY 4.3X12.25 (PERSONAL CARE ITEMS) ×3 IMPLANT
SCOPETTES 8  STERILE (MISCELLANEOUS) ×2
SCOPETTES 8 STERILE (MISCELLANEOUS) IMPLANT
TOWEL OR 17X24 6PK STRL BLUE (TOWEL DISPOSABLE) ×6 IMPLANT
WATER STERILE IRR 1000ML POUR (IV SOLUTION) ×3 IMPLANT

## 2011-12-18 NOTE — H&P (Signed)
  Chief Complaint: 46 y.o.  who presents with AUB and an abnormal Pap smear.  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  Work-up for the AUB included a pelvic U/S that show a focal endometrial lining thickening in the LUS.  A small polyp was favored.  Ht 6\' 2"  (1.88 m)  Wt 157.398 kg (347 lb)  BMI 44.55 kg/m2  LMP 10/24/2011  Past Medical History  Diagnosis Date  . Diabetes mellitus   . Headache     migraines  . GERD (gastroesophageal reflux disease)   . Hypertension     Patient denies, no BP med , d/c'd by MD   History   Social History  . Marital Status: Single    Spouse Name: N/A    Number of Children: N/A  . Years of Education: N/A   Occupational History  . Not on file.   Social History Main Topics  . Smoking status: Never Smoker   . Smokeless tobacco: Not on file  . Alcohol Use: No  . Drug Use: No  . Sexually Active:    Other Topics Concern  . Not on file   Social History Narrative  . No narrative on file   Family History  Problem Relation Age of Onset  . Diabetes Mother   . Hypertension Mother     Pertinent items are noted in HPI.  Pre-Op Diagnosis: AUB- R/O polyp, LSIL pap- high risk HPV   Planned Procedure: Procedure(s): DILATATION AND CURETTAGE /HYSTEROSCOPY COLPOSCOPY  I have reviewed the patient's history and have completed the physical exam and MEGHAN WARSHAWSKY is acceptable for surgery.  Roseanna Rainbow, MD 12/18/2011 11:52 AM

## 2011-12-18 NOTE — Anesthesia Preprocedure Evaluation (Signed)
Anesthesia Evaluation  Patient identified by MRN, date of birth, ID band Patient awake    Reviewed: Allergy & Precautions, H&P , Patient's Chart, lab work & pertinent test results, reviewed documented beta blocker date and time   History of Anesthesia Complications Negative for: history of anesthetic complications  Airway Mallampati: III TM Distance: >3 FB Neck ROM: full    Dental No notable dental hx.    Pulmonary neg pulmonary ROS,  breath sounds clear to auscultation  Pulmonary exam normal       Cardiovascular Exercise Tolerance: Good hypertension, negative cardio ROS  Rhythm:regular Rate:Normal     Neuro/Psych  Headaches, negative neurological ROS  negative psych ROS   GI/Hepatic negative GI ROS, Neg liver ROS, GERD-  Controlled,  Endo/Other  negative endocrine ROSdiabetesMorbid obesity  Renal/GU negative Renal ROS     Musculoskeletal   Abdominal   Peds  Hematology negative hematology ROS (+)   Anesthesia Other Findings Diabetes mellitus     Headache   migraines    GERD (gastroesophageal reflux disease)     Hypertension   Patient denies, no BP med , d/c'd by MD    Reproductive/Obstetrics negative OB ROS                           Anesthesia Physical Anesthesia Plan  ASA: III  Anesthesia Plan: General LMA   Post-op Pain Management:    Induction:   Airway Management Planned:   Additional Equipment:   Intra-op Plan:   Post-operative Plan:   Informed Consent: I have reviewed the patients History and Physical, chart, labs and discussed the procedure including the risks, benefits and alternatives for the proposed anesthesia with the patient or authorized representative who has indicated his/her understanding and acceptance.   Dental Advisory Given  Plan Discussed with: CRNA, Surgeon and Anesthesiologist  Anesthesia Plan Comments:         Anesthesia Quick  Evaluation

## 2011-12-18 NOTE — Transfer of Care (Signed)
Immediate Anesthesia Transfer of Care Note  Patient: Sarah Goodman  Procedure(s) Performed: Procedure(s) (LRB) with comments: DILATATION AND CURETTAGE /HYSTEROSCOPY (N/A) COLPOSCOPY (N/A)  Patient Location: PACU  Anesthesia Type:General  Level of Consciousness: awake, alert  and oriented  Airway & Oxygen Therapy: Patient Spontanous Breathing and Patient connected to nasal cannula oxygen  Post-op Assessment: Report given to PACU RN and Post -op Vital signs reviewed and stable  Post vital signs: Reviewed and stable  Complications: No apparent anesthesia complications

## 2011-12-18 NOTE — Anesthesia Procedure Notes (Signed)
Procedure Name: LMA Insertion Date/Time: 12/18/2011 2:07 PM Performed by: Graciela Husbands Pre-anesthesia Checklist: Emergency Drugs available, Timeout performed, Suction available, Patient identified and Patient being monitored Patient Re-evaluated:Patient Re-evaluated prior to inductionOxygen Delivery Method: Circle system utilized Preoxygenation: Pre-oxygenation with 100% oxygen (Pre  O2 for 5 minutes) Intubation Type: IV induction Ventilation: Mask ventilation without difficulty LMA: LMA inserted LMA Size: 5.0 Number of attempts: 2 (#4 too small ) Airway Equipment and Method: Patient positioned with wedge pillow Tube secured with: Tape Dental Injury: Teeth and Oropharynx as per pre-operative assessment

## 2011-12-18 NOTE — Anesthesia Postprocedure Evaluation (Signed)
Anesthesia Post Note  Patient: Sarah Goodman  Procedure(s) Performed: Procedure(s) (LRB): DILATATION AND CURETTAGE /HYSTEROSCOPY (N/A) COLPOSCOPY (N/A)  Anesthesia type: General  Patient location: PACU  Post pain: Pain level controlled  Post assessment: Post-op Vital signs reviewed  Last Vitals:  Filed Vitals:   12/18/11 1515  BP: 136/68  Pulse: 87  Temp:   Resp: 20    Post vital signs: Reviewed  Level of consciousness: sedated  Complications: No apparent anesthesia complications

## 2011-12-18 NOTE — Op Note (Signed)
Preoperative diagnosis: AUB, LSIL Pap smear  Postoperative diagnosis: same  Procedure: Diagnostic hysteroscopy, dilatation and curettage, colposcopy with biopsies  Surgeon: Antionette Char A  Anesthesia: LMA, paracervical block  Estimated blood loss: minimal  Urine output: 25 ml  IV Fluids: per Anesthesiology  Complications: none  Specimen: PATHOLOGY  Operative Findings: On colposcopic exam, the anterior SCJ was erythematous.  The posterior aspect was diffusely aceto-white after acetic acid application.  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  The endometrium was more lush on the posterior wall.  There were no discrete lesions.  The left tubal ostium was seen.  Description of procedure:   The patient was taken to the operating room and placed on the operating table in the semi-lithotomy position in [place] stirrups.  Examination under anesthesia was performed.  A colposcopic exam was found to be satisfactory with the above findings noted.  Biopsies were taken at 12, 5 and 7 o'clock.  An endocervical curettage was performed.  The patient was prepped and draped in the usual manner.  After a time-out had been completed, a speculum was placed in the vagina.  The anterior lip of the cervix was grasped with a single-toothed tenaculum.    10 cc of 1% lidocaine were injected at 4 and 7 o'clock to produce a paracervical block.  The uterine cavity sounded to 9 cm.  The endocervical canal was dilated with Shawnie Pons dilators.  A 5 mm diagnostic hysteroscope with Glycine as the distending medium was used to perform a diagnostic hysteroscopy.  The findings are noted above.    The hysteroscope was removed.  A small, Sims curette was used to perform an endometrial curettage.  All the instruments were removed from the vagina.  Final instrument counts were correct.  The patient was taken to the PACU in stable condition.

## 2011-12-18 NOTE — Discharge Instructions (Addendum)
D&C  Care After Read the instructions below. Refer to this sheet in the next few weeks. These instructions provide you with general information on caring for yourself after you leave the hospital. Your caregiver may also give you specific instructions.  D&C  is a minor operation. A D&C involves the stretching (dilatation) of the cervix and scraping (curettage) of the inside lining of the uterus.  You may have light cramping and bleeding for a couple of days to two weeks after the procedure. This procedure may be done in a hospital, outpatient clinic, or doctor's office. You may be given a drug to make you sleep (general anesthetic) or a drug that numbs the area (local anesthetic) in and around the cervix. HOME CARE INSTRUCTIONS  Do not drive for 24 hours.   Wait one week before returning to strenuous activities.   Take your temperature two times a day for 4 days and write it down. Provide these temperatures to your caregiver if they are abnormal (above 98.6 F or 37.0 C).   Avoid long periods of standing, and do no heavy lifting (more than 10 pounds), pushing or pulling.   Limit stair climbing to once or twice a day.   Take rest periods often.   You may resume your usual diet.   Drink plenty of fluids (6-8 glasses a day).   You should return to your usual bowel function. If constipation should occur, you may:   Take a mild laxative with permission from your caregiver.   Add fruit and bran to your diet.   Drink more fluids. This helps with constipation.   Take showers instead of baths until your caregiver gives you permission to take baths.   Do not go swimming or use a hot tub until your caregiver gives you permission.   Try to have someone with you or available for you the first 24 to 48 hours, especially if you had a general anesthetic.   Do not douche, use tampons, or have intercourse until after your follow-up appointment, or when your caregiver approves.   Only take  over-the-counter or prescription medicines for pain, discomfort, or fever as directed by your caregiver. Do not take aspirin. It can cause bleeding.   If a prescription was given, follow your caregiver's directions. You may be given a medicine that kills germs (antibiotic) to prevent an infection.   Keep all your follow-up appointments recommended by your caregiver.  SEEK MEDICAL CARE IF:  You have increasing cramps or pain not relieved with medication.   You develop belly (abdominal) pain which does not seem to be related to the same area of earlier cramping and pain.   You feel dizzy or feel like fainting.   You have bad smelling vaginal discharge.   You develop a rash.   You develop a reaction or allergy to your medication.  SEEK IMMEDIATE MEDICAL CARE IF:  Bleeding is heavier than a normal menstrual period.   You have an oral temperature above 100.6, not controlled by medicine.   You develop chest pain.   You develop shortness of breath.   You pass out.   You develop pain in your shoulder strap area.   You develop heavy vaginal bleeding with or without blood clots.  MAKE SURE YOU:   Understand these instructions.   Will watch your condition.   Will get help right away if you are not doing well or get worse.  Document Released: 01/24/2000 Document Re-Released: 07/16/2009 [employer] Patient Information 2011  [employer] DISCHARGE INSTRUCTIONS: HYSTEROSCOPY / ENDOMETRIAL ABLATION The following instructions have been prepared to help you care for yourself upon your return home.  Personal hygiene: Marland Kitchen Use sanitary pads for vaginal drainage, not tampons. . Shower the day after your procedure. . NO tub baths, pools or Jacuzzis for 2-3 weeks. . Wipe front to back after using the bathroom.  Activity and limitations: . Do NOT drive or operate any equipment for 24 hours. The effects of anesthesia are still present and drowsiness may result. . Do NOT rest in bed all  day. . Walking is encouraged. . Walk up and down stairs slowly. . You may resume your normal activity in one to two days or as indicated by your physician. Sexual activity: NO intercourse for at least 2 weeks after the procedure, or as indicated by your Doctor.  Diet: Eat a light meal as desired this evening. You may resume your usual diet tomorrow.  Return to Work: You may resume your work activities in one to two days or as indicated by Therapist, sports.  What to expect after your surgery: Expect to have vaginal bleeding/discharge for 2-3 days and spotting for up to 10 days. It is not unusual to have soreness for up to 1-2 weeks. You may have a slight burning sensation when you urinate for the first day. Mild cramps may continue for a couple of days. You may have a regular period in 2-6 weeks.  No ibuprofen (motrin, advil) or aleve until after 8:40pm today.   Call your doctor for any of the following: . Excessive vaginal bleeding or clotting, saturating and changing one pad every hour. . Inability to urinate 6 hours after discharge from hospital. . Pain not relieved by pain medication. . Fever of 100.4 F or greater. . Unusual vaginal discharge or odor.  Return to office _________________Call for an appointment ___________________ Patient's signature: ______________________ Nurse's signature ________________________  Post Anesthesia Care Unit [phone]

## 2011-12-21 ENCOUNTER — Encounter (HOSPITAL_COMMUNITY): Payer: Self-pay | Admitting: Obstetrics & Gynecology

## 2011-12-21 LAB — GLUCOSE, CAPILLARY: Glucose-Capillary: 102 mg/dL — ABNORMAL HIGH (ref 70–99)

## 2012-02-26 ENCOUNTER — Encounter (HOSPITAL_COMMUNITY): Payer: Self-pay | Admitting: *Deleted

## 2012-02-26 ENCOUNTER — Emergency Department (HOSPITAL_COMMUNITY)
Admission: EM | Admit: 2012-02-26 | Discharge: 2012-02-26 | Disposition: A | Discharge status: Home / Self Care | Payer: Medicaid Other | Attending: Emergency Medicine | Admitting: Emergency Medicine

## 2012-02-26 DIAGNOSIS — Z9889 Other specified postprocedural states: Secondary | ICD-10-CM | POA: Insufficient documentation

## 2012-02-26 DIAGNOSIS — E119 Type 2 diabetes mellitus without complications: Secondary | ICD-10-CM | POA: Insufficient documentation

## 2012-02-26 DIAGNOSIS — Z79899 Other long term (current) drug therapy: Secondary | ICD-10-CM | POA: Insufficient documentation

## 2012-02-26 DIAGNOSIS — K219 Gastro-esophageal reflux disease without esophagitis: Secondary | ICD-10-CM | POA: Insufficient documentation

## 2012-02-26 DIAGNOSIS — I10 Essential (primary) hypertension: Secondary | ICD-10-CM | POA: Insufficient documentation

## 2012-02-26 DIAGNOSIS — R109 Unspecified abdominal pain: Secondary | ICD-10-CM | POA: Insufficient documentation

## 2012-02-26 DIAGNOSIS — G43909 Migraine, unspecified, not intractable, without status migrainosus: Secondary | ICD-10-CM | POA: Insufficient documentation

## 2012-02-26 DIAGNOSIS — Z3202 Encounter for pregnancy test, result negative: Secondary | ICD-10-CM | POA: Insufficient documentation

## 2012-02-26 LAB — CBC WITH DIFFERENTIAL/PLATELET
Basophils Absolute: 0.1 10*3/uL (ref 0.0–0.1)
Basophils Relative: 1 % (ref 0–1)
Eosinophils Absolute: 0.2 10*3/uL (ref 0.0–0.7)
Eosinophils Relative: 2 % (ref 0–5)
HCT: 34.8 % — ABNORMAL LOW (ref 36.0–46.0)
Hemoglobin: 11.4 g/dL — ABNORMAL LOW (ref 12.0–15.0)
Lymphocytes Relative: 37 % (ref 12–46)
Lymphs Abs: 3.2 10*3/uL (ref 0.7–4.0)
MCH: 28.1 pg (ref 26.0–34.0)
MCHC: 32.8 g/dL (ref 30.0–36.0)
MCV: 85.7 fL (ref 78.0–100.0)
Monocytes Absolute: 0.6 10*3/uL (ref 0.1–1.0)
Monocytes Relative: 7 % (ref 3–12)
Neutro Abs: 4.7 10*3/uL (ref 1.7–7.7)
Neutrophils Relative %: 53 % (ref 43–77)
Platelets: 452 10*3/uL — ABNORMAL HIGH (ref 150–400)
RBC: 4.06 MIL/uL (ref 3.87–5.11)
RDW: 15.4 % (ref 11.5–15.5)
WBC: 8.8 10*3/uL (ref 4.0–10.5)

## 2012-02-26 LAB — URINALYSIS, ROUTINE W REFLEX MICROSCOPIC
Bilirubin Urine: NEGATIVE
Glucose, UA: NEGATIVE mg/dL
Hgb urine dipstick: NEGATIVE
Ketones, ur: NEGATIVE mg/dL
Nitrite: NEGATIVE
Protein, ur: NEGATIVE mg/dL
Specific Gravity, Urine: 1.025 (ref 1.005–1.030)
Urobilinogen, UA: 0.2 mg/dL (ref 0.0–1.0)
pH: 5.5 (ref 5.0–8.0)

## 2012-02-26 LAB — COMPREHENSIVE METABOLIC PANEL
ALT: 16 U/L (ref 0–35)
AST: 14 U/L (ref 0–37)
Albumin: 3.4 g/dL — ABNORMAL LOW (ref 3.5–5.2)
Alkaline Phosphatase: 44 U/L (ref 39–117)
BUN: 11 mg/dL (ref 6–23)
CO2: 26 mEq/L (ref 19–32)
Calcium: 9.7 mg/dL (ref 8.4–10.5)
Chloride: 102 mEq/L (ref 96–112)
Creatinine, Ser: 0.76 mg/dL (ref 0.50–1.10)
GFR calc Af Amer: >90 mL/min (ref >90)
GFR calc non Af Amer: >90 mL/min (ref >90)
Glucose, Bld: 85 mg/dL (ref 70–99)
Potassium: 4 mEq/L (ref 3.5–5.1)
Sodium: 137 mEq/L (ref 135–145)
Total Bilirubin: 0.1 mg/dL — ABNORMAL LOW (ref 0.3–1.2)
Total Protein: 7.7 g/dL (ref 6.0–8.3)

## 2012-02-26 LAB — URINE MICROSCOPIC-ADD ON

## 2012-02-26 LAB — POCT PREGNANCY, URINE: Preg Test, Ur: NEGATIVE

## 2012-02-26 MED ORDER — OXYCODONE-ACETAMINOPHEN 5-325 MG PO TABS: 2.0000 | ORAL_TABLET | ORAL | Status: DC | PRN

## 2012-02-26 MED ORDER — METHOCARBAMOL 750 MG PO TABS: 750.0000 mg | ORAL_TABLET | Freq: Four times a day (QID) | ORAL | Status: DC

## 2012-02-26 NOTE — ED Notes (Signed)
Pt reports left flank pain, abdominal pain x 2 weeks. No nausea/vomiting/urinary symptoms. States urinary frequency.

## 2012-02-26 NOTE — Discharge Instructions (Signed)
Muscle Strain °Muscle strain occurs when a muscle is stretched beyond its normal length. A small number of muscle fibers generally are torn. This is especially common in athletes. This happens when a sudden, violent force placed on a muscle stretches it too far. Usually, recovery from muscle strain takes 1 to 2 weeks. Complete healing will take 5 to 6 weeks.  °HOME CARE INSTRUCTIONS  °· While awake, apply ice to the sore muscle for the first 2 days after the injury. °· Put ice in a plastic bag. °· Place a towel between your skin and the bag. °· Leave the ice on for 15 to 20 minutes each hour. °· Do not use the strained muscle for several days, until you no longer have pain. °· You may wrap the injured area with an elastic bandage for comfort. Be careful not to wrap it too tightly. This may interfere with blood circulation or increase swelling. °· Only take over-the-counter or prescription medicines for pain, discomfort, or fever as directed by your caregiver. °SEEK MEDICAL CARE IF:  °You have increasing pain or swelling in the injured area. °MAKE SURE YOU:  °· Understand these instructions. °· Will watch your condition. °· Will get help right away if you are not doing well or get worse. °Document Released: 01/26/2005 Document Revised: 04/20/2011 Document Reviewed: 02/07/2011 °ExitCare® Patient Information ©2013 [employer]. ° °

## 2012-02-26 NOTE — ED Notes (Signed)
[id number]<AV> Expected date:<BR> Expected time:<BR> Means of arrival:<BR> Comments:<BR> Hold for triage-flank pain

## 2012-02-26 NOTE — ED Provider Notes (Signed)
History     CSN: [id number]  Arrival date & time 02/26/12  1147   First MD Initiated Contact with Patient 02/26/12 1321      Chief Complaint  Patient presents with  . Flank Pain    (Consider location/radiation/quality/duration/timing/severity/associated sxs/prior treatment) Patient is a 47 y.o. female presenting with flank pain. The history is provided by the patient.  Flank Pain   patient here left-sided flank pain x2 weeks worse with certain positions. Pain is characterized as sharp and does not radiate down her leg. Denies any dysuria or hematuria. No rashes to her flight noted. No midline pain. No change in bowel or bladder function. Has used over-the-counter medications without relief  Past Medical History  Diagnosis Date  . Diabetes mellitus   . Headache     migraines  . GERD (gastroesophageal reflux disease)   . Hypertension     Patient denies, no BP med , d/c'd by MD    Past Surgical History  Procedure Date  . Hysteroscopy w/d&c 12/18/2011    Procedure: DILATATION AND CURETTAGE /HYSTEROSCOPY;  Surgeon: Antionette Char, MD;  Location: [facility];  Service: Gynecology;  Laterality: N/A;  . Colposcopy 12/18/2011    Procedure: COLPOSCOPY;  Surgeon: Antionette Char, MD;  Location: [facility];  Service: Gynecology;  Laterality: N/A;    Family History  Problem Relation Age of Onset  . Diabetes Mother   . Hypertension Mother     History  Substance Use Topics  . Smoking status: Never Smoker   . Smokeless tobacco: Not on file  . Alcohol Use: No    OB History    Grav Para Term Preterm Abortions TAB SAB Ect Mult Living   4 4              Review of Systems  Genitourinary: Positive for flank pain.  All other systems reviewed and are negative.    Allergies  Review of patient's allergies indicates no known allergies.  Home Medications   Current Outpatient Rx  Name  Route  Sig  Dispense  Refill  . ACETAMINOPHEN 500 MG PO TABS   Oral   Take 1,000 mg by mouth  every 6 (six) hours as needed. For pain.         . ALBUTEROL SULFATE HFA 108 (90 BASE) MCG/ACT IN AERS   Inhalation   Inhale 2 puffs into the lungs every 6 (six) hours as needed. For shortness of breath/wheezing/asthma         . ESOMEPRAZOLE MAGNESIUM 40 MG PO PACK   Oral   Take 40 mg by mouth daily before breakfast.           . METFORMIN HCL 850 MG PO TABS   Oral   Take 850 mg by mouth 2 (two) times daily with a meal.           . SITAGLIPTIN-METFORMIN HCL 50-500 MG PO TABS   Oral   Take 1 tablet by mouth 2 (two) times daily with a meal.         . TOPIRAMATE 50 MG PO TABS   Oral   Take 200 mg by mouth at bedtime.            BP 154/75  Pulse 91  Temp 98 F (36.7 C) (Oral)  Resp 16  SpO2 98%  Physical Exam  Nursing note and vitals reviewed. Constitutional: She is oriented to person, place, and time. She appears well-developed and well-nourished.  Non-toxic appearance. No distress.  HENT:  Head: Normocephalic and atraumatic.  Eyes: Conjunctivae normal, EOM and lids are normal. Pupils are equal, round, and reactive to light.  Neck: Normal range of motion. Neck supple. No tracheal deviation present. No mass present.  Cardiovascular: Normal rate, regular rhythm and normal heart sounds.  Exam reveals no gallop.   No murmur heard. Pulmonary/Chest: Effort normal and breath sounds normal. No stridor. No respiratory distress. She has no decreased breath sounds. She has no wheezes. She has no rhonchi. She has no rales.  Abdominal: Soft. Normal appearance and bowel sounds are normal. She exhibits no distension. There is no tenderness. There is no rebound and no CVA tenderness.  Musculoskeletal: Normal range of motion. She exhibits no edema and no tenderness.       Right shoulder: She exhibits spasm.       Arms: Neurological: She is alert and oriented to person, place, and time. She has normal strength. No cranial nerve deficit or sensory deficit. GCS eye subscore is 4. GCS  verbal subscore is 5. GCS motor subscore is 6.  Skin: Skin is warm and dry. No abrasion and no rash noted.  Psychiatric: She has a normal mood and affect. Her speech is normal and behavior is normal.    ED Course  Procedures (including critical care time)  Labs Reviewed  CBC WITH DIFFERENTIAL - Abnormal; Notable for the following:    Hemoglobin 11.4 (*)     HCT 34.8 (*)     Platelets 452 (*)     All other components within normal limits  COMPREHENSIVE METABOLIC PANEL - Abnormal; Notable for the following:    Albumin 3.4 (*)     Total Bilirubin 0.1 (*)     All other components within normal limits  URINALYSIS, ROUTINE W REFLEX MICROSCOPIC - Abnormal; Notable for the following:    APPearance CLOUDY (*)     Leukocytes, UA MODERATE (*)     All other components within normal limits  POCT PREGNANCY, URINE  URINE MICROSCOPIC-ADD ON   No results found.   No diagnosis found.    MDM  Pt with likely musculoskeletal pain--stable for d/c        Toy Baker, MD 02/26/12 1346

## 2012-03-03 ENCOUNTER — Ambulatory Visit (HOSPITAL_COMMUNITY)
Admission: RE | Admit: 2012-03-03 | Discharge: 2012-03-03 | Disposition: A | Discharge status: Home / Self Care | Payer: Medicaid Other | Source: Ambulatory Visit | Attending: Obstetrics & Gynecology | Admitting: Obstetrics & Gynecology

## 2012-03-03 DIAGNOSIS — N949 Unspecified condition associated with female genital organs and menstrual cycle: Secondary | ICD-10-CM | POA: Insufficient documentation

## 2012-03-03 DIAGNOSIS — N83209 Unspecified ovarian cyst, unspecified side: Secondary | ICD-10-CM | POA: Insufficient documentation

## 2012-03-03 DIAGNOSIS — R102 Pelvic and perineal pain: Secondary | ICD-10-CM

## 2012-03-03 DIAGNOSIS — N838 Other noninflammatory disorders of ovary, fallopian tube and broad ligament: Secondary | ICD-10-CM | POA: Insufficient documentation

## 2012-03-21 ENCOUNTER — Emergency Department (INDEPENDENT_AMBULATORY_CARE_PROVIDER_SITE_OTHER): Payer: Medicaid Other

## 2012-03-21 ENCOUNTER — Encounter (HOSPITAL_COMMUNITY): Payer: Self-pay | Admitting: *Deleted

## 2012-03-21 ENCOUNTER — Emergency Department (HOSPITAL_COMMUNITY)
Admission: EM | Admit: 2012-03-21 | Discharge: 2012-03-21 | Disposition: A | Discharge status: Home / Self Care | Payer: Medicaid Other | Source: Home / Self Care | Attending: Emergency Medicine | Admitting: Emergency Medicine

## 2012-03-21 DIAGNOSIS — IMO0002 Reserved for concepts with insufficient information to code with codable children: Secondary | ICD-10-CM

## 2012-03-21 DIAGNOSIS — S8390XA Sprain of unspecified site of unspecified knee, initial encounter: Secondary | ICD-10-CM

## 2012-03-21 MED ORDER — HYDROCODONE-ACETAMINOPHEN 5-325 MG PO TABS: ORAL_TABLET | ORAL | Status: DC

## 2012-03-21 MED ORDER — NAPROXEN 500 MG PO TABS: 500.0000 mg | ORAL_TABLET | Freq: Two times a day (BID) | ORAL | Status: DC

## 2012-03-21 NOTE — ED Notes (Signed)
Pt  Reports   She  Felled    Early      Today  And    Injured her l  Knee    She   Is  Ambulatory

## 2012-03-21 NOTE — Discharge Instructions (Signed)
Knee Sprain  A knee sprain is a tear in one of the strong, fibrous tissues that connect the bones (ligaments) in your knee. The severity of the sprain depends on how much of the ligament is torn. The tear can be either partial or complete.  CAUSES   Often, sprains are a result of a fall or injury. The force of the impact causes the fibers of your ligament to stretch too much. This excess tension causes the fibers of your ligament to tear.  SYMPTOMS   You may have some loss of motion in your knee. Other symptoms include:   Bruising.   Tenderness.   Swelling.  DIAGNOSIS   In order to diagnose knee sprain, your caregiver will physically examine your knee to determine how torn the ligament is. Your caregiver may also suggest an X-ray exam of your knee to make sure no bones are broken.  TREATMENT   If your ligament is only partially torn, treatment usually involves keeping the knee in a fixed position (immobilization) or bracing your knee for activities that require movement for several weeks. To do this, your caregiver will apply a bandage, cast, or splint to keep your knee from moving or support your knee during movement until it heals. For a partially torn ligament, the healing process usually takes 4 to 6 weeks.  If your ligament is completely torn, depending on which ligament it is, you may need surgery to reconnect the ligament to the bone or reconstruct it. After surgery, a cast or splint may be applied and will need to stay on your knee for 4 to 6 weeks while your ligament heals.  HOME CARE INSTRUCTIONS   Keep your injured knee elevated to decrease swelling.   To ease pain and swelling, apply ice to your knee twice a day, for 2 to 3 days:   Put ice in a plastic bag.   Place a towel between your skin and the bag.   Leave the ice on for 15 minutes.   Only take over-the-counter or prescription medicine for pain as directed by your caregiver.   Do not leave your knee unprotected until pain and stiffness go  away (usually 4 to 6 weeks).   Do not allow your cast or splint to get wet. If you have been instructed not to remove it, cover your cast or splint with a plastic bag when you shower or bathe. Do not swim.   Your caregiver may suggest exercises for you to do during your recovery to prevent or limit permanent weakness and stiffness.  SEEK IMMEDIATE MEDICAL CARE IF:   Your cast or splint becomes damaged.   Your pain becomes worse.  MAKE SURE YOU:   Understand these instructions.   Will watch your condition.   Will get help right away if you are not doing well or get worse.  Document Released: 01/26/2005 Document Revised: 04/20/2011 Document Reviewed: 01/10/2011  Exit[employer] Patient Information 2013 [employer].

## 2012-03-21 NOTE — ED Provider Notes (Addendum)
Chief Complaint  Patient presents with  . Knee Injury    History of Present Illness:   Sarah Goodman is a 47 year old female who injured her left knee today at [employer]. She stepped on a piece of paper and slipped, falling to the floor. She twisted her left knee. She is able ambulate but it hurts and she was with a limp. The pain is rated over 10 in intensity. There's been no swelling. The knee sometimes feels like is about to give way. There is no locking. No numbness or tingling.  Review of Systems:  Other than noted above, the patient denies any of the following symptoms: Systemic:  No fevers, chills, sweats, or aches.  No fatigue or tiredness. Musculoskeletal:  No joint pain, arthritis, bursitis, swelling, back pain, or neck pain.  Neurological:  No muscular weakness, paresthesias, headache, or trouble with speech or coordination.  No dizziness.   PMFSH:  Past medical history, family history, social history, meds, and allergies were reviewed.  No prior history of knee pain or arthritis.  Physical Exam:   Vital signs:  BP 141/81  Pulse 88  Temp(Src) 97.7 F (36.5 C) (Oral)  Resp 22  SpO2 100%  LMP 03/06/2012 Gen:  Alert and oriented times 3.  In no distress. Musculoskeletal: There was mild generalized tenderness to palpation, no effusion or fluid, knee had a full range of motion with pain on flexion.   McMurray's test was negative.  Lachman's test was negative.  Anterior drawer test was negative.   Varus and valgus stress negative.  Otherwise, all joints had a full a ROM with no swelling, bruising or deformity.  No edema, pulses full. Extremities were warm and pink.  Capillary refill was brisk.  Skin:  Clear, warm and dry.  No rash. Neuro:  Alert and oriented times 3.  Muscle strength was normal.  Sensation was intact to light touch.   Radiology:  Dg Knee Complete 4 Views Left  03/21/2012  *RADIOLOGY REPORT*  Clinical Data: Fall.  Medial knee pain.  LEFT KNEE - COMPLETE 4+ VIEW   Comparison: None.  Findings: The knee is located.  There is no significant effusion. Mild degenerative changes are noted predominately in the medial and patellofemoral compartments.  IMPRESSION:  1.  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 2.  No acute abnormality.   Original Report Authenticated By: Marin Roberts, M.D.    I reviewed the images independently and personally and concur with the radiologist's findings.  Course in Urgent Care Center:   The knee was wrapped in Ace wrap.  Assessment:  The encounter diagnosis was Knee sprain.  Plan:   1.  The following meds were prescribed:   Discharge Medication List as of 03/21/2012  5:32 PM    START taking these medications   Details  HYDROcodone-acetaminophen (NORCO/VICODIN) 5-325 MG per tablet 1 to 2 tabs every 4 to 6 hours as needed for pain., Print    naproxen (NAPROSYN) 500 MG tablet Take 1 tablet (500 mg total) by mouth 2 (two) times daily., Starting 03/21/2012, Until Discontinued, Normal       2.  The patient was instructed in symptomatic care, including rest and activity, elevation, application of ice and compression.  Appropriate handouts were given. 3.  The patient was told to return if becoming worse in any way, if no better in 3 or 4 days, and given some red flag symptoms that would indicate earlier return.   4.  The patient was told to follow up in 2 weeks if no improvement.    Reuben Likes, MD 03/21/12 2111  Reuben Likes, MD 03/21/12 2112

## 2012-05-03 ENCOUNTER — Encounter (HOSPITAL_COMMUNITY): Payer: Self-pay | Admitting: Emergency Medicine

## 2012-05-03 ENCOUNTER — Emergency Department (HOSPITAL_COMMUNITY)
Admission: EM | Admit: 2012-05-03 | Discharge: 2012-05-03 | Disposition: A | Discharge status: Home / Self Care | Payer: Medicaid Other | Attending: Emergency Medicine | Admitting: Emergency Medicine

## 2012-05-03 DIAGNOSIS — R32 Unspecified urinary incontinence: Secondary | ICD-10-CM | POA: Insufficient documentation

## 2012-05-03 DIAGNOSIS — K219 Gastro-esophageal reflux disease without esophagitis: Secondary | ICD-10-CM | POA: Insufficient documentation

## 2012-05-03 DIAGNOSIS — N390 Urinary tract infection, site not specified: Secondary | ICD-10-CM | POA: Insufficient documentation

## 2012-05-03 DIAGNOSIS — Z8679 Personal history of other diseases of the circulatory system: Secondary | ICD-10-CM | POA: Insufficient documentation

## 2012-05-03 DIAGNOSIS — I10 Essential (primary) hypertension: Secondary | ICD-10-CM | POA: Insufficient documentation

## 2012-05-03 DIAGNOSIS — M549 Dorsalgia, unspecified: Secondary | ICD-10-CM

## 2012-05-03 DIAGNOSIS — M545 Low back pain, unspecified: Secondary | ICD-10-CM | POA: Insufficient documentation

## 2012-05-03 DIAGNOSIS — R300 Dysuria: Secondary | ICD-10-CM | POA: Insufficient documentation

## 2012-05-03 DIAGNOSIS — Z7982 Long term (current) use of aspirin: Secondary | ICD-10-CM | POA: Insufficient documentation

## 2012-05-03 DIAGNOSIS — E119 Type 2 diabetes mellitus without complications: Secondary | ICD-10-CM | POA: Insufficient documentation

## 2012-05-03 DIAGNOSIS — Z79899 Other long term (current) drug therapy: Secondary | ICD-10-CM | POA: Insufficient documentation

## 2012-05-03 LAB — URINALYSIS, ROUTINE W REFLEX MICROSCOPIC
Glucose, UA: NEGATIVE mg/dL
Hgb urine dipstick: NEGATIVE
Ketones, ur: 15 mg/dL — AB
Nitrite: NEGATIVE
Protein, ur: 30 mg/dL — AB
Specific Gravity, Urine: 1.033 — ABNORMAL HIGH (ref 1.005–1.030)
Urobilinogen, UA: 1 mg/dL (ref 0.0–1.0)
pH: 5 (ref 5.0–8.0)

## 2012-05-03 LAB — URINE MICROSCOPIC-ADD ON

## 2012-05-03 LAB — POCT I-STAT, CHEM 8
BUN: 11 mg/dL (ref 6–23)
Calcium, Ion: 1.2 mmol/L (ref 1.12–1.23)
Chloride: 103 mEq/L (ref 96–112)
Creatinine, Ser: 0.7 mg/dL (ref 0.50–1.10)
Glucose, Bld: 184 mg/dL — ABNORMAL HIGH (ref 70–99)
HCT: 40 % (ref 36.0–46.0)
Hemoglobin: 13.6 g/dL (ref 12.0–15.0)
Potassium: 3.8 mEq/L (ref 3.5–5.1)
Sodium: 138 mEq/L (ref 135–145)
TCO2: 25 mmol/L (ref 0–100)

## 2012-05-03 MED ORDER — HYDROMORPHONE HCL PF 1 MG/ML IJ SOLN
1.0000 mg | Freq: Once | INTRAMUSCULAR | Status: AC
  Administered 2012-05-03: 1 mg via INTRAVENOUS
  Filled 2012-05-03: qty 1

## 2012-05-03 MED ORDER — CEFTRIAXONE SODIUM 1 G IJ SOLR
1.0000 g | INTRAMUSCULAR | Status: DC
  Administered 2012-05-03: 1 g via INTRAMUSCULAR
  Filled 2012-05-03: qty 10

## 2012-05-03 MED ORDER — HYDROCODONE-ACETAMINOPHEN 5-325 MG PO TABS: 2.0000 | ORAL_TABLET | ORAL | Status: DC | PRN

## 2012-05-03 MED ORDER — IBUPROFEN 800 MG PO TABS: 800.0000 mg | ORAL_TABLET | Freq: Three times a day (TID) | ORAL | Status: DC

## 2012-05-03 MED ORDER — PROMETHAZINE HCL 25 MG/ML IJ SOLN
25.0000 mg | Freq: Once | INTRAMUSCULAR | Status: DC
  Filled 2012-05-03: qty 1

## 2012-05-03 MED ORDER — CEPHALEXIN 500 MG PO CAPS: 500.0000 mg | ORAL_CAPSULE | Freq: Four times a day (QID) | ORAL | Status: DC

## 2012-05-03 MED ORDER — ONDANSETRON HCL 4 MG/2ML IJ SOLN
4.0000 mg | Freq: Once | INTRAMUSCULAR | Status: AC
  Administered 2012-05-03: 4 mg via INTRAVENOUS
  Filled 2012-05-03: qty 2

## 2012-05-03 MED ORDER — HYDROMORPHONE HCL PF 1 MG/ML IJ SOLN: 1.0000 mg | Freq: Once | INTRAMUSCULAR | Status: DC

## 2012-05-03 NOTE — ED Notes (Signed)
Pt reports left flank pain for the last several weeks. C/o increasing pain along left flank with deep inspiration. States is sometimes incontinent of urine and does have some burning when she urinates.

## 2012-05-03 NOTE — Discharge Instructions (Signed)
Back Pain, Adult Low back pain is very common. About 1 in 5 people have back pain.The cause of low back pain is rarely dangerous. The pain often gets better over time.About half of people with a sudden onset of back pain feel better in just 2 weeks. About 8 in 10 people feel better by 6 weeks.  CAUSES Some common causes of back pain include:  Strain of the muscles or ligaments supporting the spine.  Wear and tear (degeneration) of the spinal discs.  Arthritis.  Direct injury to the back. DIAGNOSIS Most of the time, the direct cause of low back pain is not known.However, back pain can be treated effectively even when the exact cause of the pain is unknown.Answering your caregiver's questions about your overall health and symptoms is one of the most accurate ways to make sure the cause of your pain is not dangerous. If your caregiver needs more information, he or she may order lab work or imaging tests (X-rays or MRIs).However, even if imaging tests show changes in your back, this usually does not require surgery. HOME CARE INSTRUCTIONS For many people, back pain returns.Since low back pain is rarely dangerous, it is often a condition that people can learn to manageon their own.   Remain active. It is stressful on the back to sit or stand in one place. Do not sit, drive, or stand in one place for more than 30 minutes at a time. Take short walks on level surfaces as soon as pain allows.Try to increase the length of time you walk each day.  Do not stay in bed.Resting more than 1 or 2 days can delay your recovery.  Do not avoid exercise or work.Your body is made to move.It is not dangerous to be active, even though your back may hurt.Your back will likely heal faster if you return to being active before your pain is gone.  Pay attention to your body when you bend and lift. Many people have less discomfortwhen lifting if they bend their knees, keep the load close to their bodies,and  avoid twisting. Often, the most comfortable positions are those that put less stress on your recovering back.  Find a comfortable position to sleep. Use a firm mattress and lie on your side with your knees slightly bent. If you lie on your back, put a pillow under your knees.  Only take over-the-counter or prescription medicines as directed by your caregiver. Over-the-counter medicines to reduce pain and inflammation are often the most helpful.Your caregiver may prescribe muscle relaxant drugs.These medicines help dull your pain so you can more quickly return to your normal activities and healthy exercise.  Put ice on the injured area.  Put ice in a plastic bag.  Place a towel between your skin and the bag.  Leave the ice on for 15 to 20 minutes, 3 to 4 times a day for the first 2 to 3 days. After that, ice and heat may be alternated to reduce pain and spasms.  Ask your caregiver about trying back exercises and gentle massage. This may be of some benefit.  Avoid feeling anxious or stressed.Stress increases muscle tension and can worsen back pain.It is important to recognize when you are anxious or stressed and learn ways to manage it.Exercise is a great option. SEEK MEDICAL CARE IF:  You have pain that is not relieved with rest or medicine.  You have pain that does not improve in 1 week.  You have new symptoms.  You are generally   relieved with rest or medicine.   You have pain that does not improve in 1 week.   You have new symptoms.   You are generally not feeling well.  SEEK IMMEDIATE MEDICAL CARE IF:    You have pain that radiates from your back into your legs.   You develop new bowel or bladder control problems.   You have unusual weakness or numbness in your arms or legs.   You develop nausea or vomiting.   You develop abdominal pain.   You feel faint.  Document Released: 01/26/2005 Document Revised: 07/28/2011 Document Reviewed: 06/16/2010  ExitCare Patient Information 2013 [employer].    Urinary Tract Infection  Urinary tract infections (UTIs) can develop anywhere along your urinary tract.  Your urinary tract is your body's drainage system for removing wastes and extra water. Your urinary tract includes two kidneys, two ureters, a bladder, and a urethra. Your kidneys are a pair of bean-shaped organs. Each kidney is about the size of your fist. They are located below your ribs, one on each side of your spine.  CAUSES  Infections are caused by microbes, which are microscopic organisms, including fungi, viruses, and bacteria. These organisms are so small that they can only be seen through a microscope. Bacteria are the microbes that most commonly cause UTIs.  SYMPTOMS   Symptoms of UTIs may vary by age and gender of the patient and by the location of the infection. Symptoms in young women typically include a frequent and intense urge to urinate and a painful, burning feeling in the bladder or urethra during urination. Older women and men are more likely to be tired, shaky, and weak and have muscle aches and abdominal pain. A fever may mean the infection is in your kidneys. Other symptoms of a kidney infection include pain in your back or sides below the ribs, nausea, and vomiting.  DIAGNOSIS  To diagnose a UTI, your caregiver will ask you about your symptoms. Your caregiver also will ask to provide a urine sample. The urine sample will be tested for bacteria and white blood cells. White blood cells are made by your body to help fight infection.  TREATMENT   Typically, UTIs can be treated with medication. Because most UTIs are caused by a bacterial infection, they usually can be treated with the use of antibiotics. The choice of antibiotic and length of treatment depend on your symptoms and the type of bacteria causing your infection.  HOME CARE INSTRUCTIONS   If you were prescribed antibiotics, take them exactly as your caregiver instructs you. Finish the medication even if you feel better after you have only taken some of the medication.   Drink enough water and fluids to keep your urine clear or pale  yellow.   Avoid caffeine, tea, and carbonated beverages. They tend to irritate your bladder.   Empty your bladder often. Avoid holding urine for long periods of time.   Empty your bladder before and after sexual intercourse.   After a bowel movement, women should cleanse from front to back. Use each tissue only once.  SEEK MEDICAL CARE IF:    You have back pain.   You develop a fever.   Your symptoms do not begin to resolve within 3 days.  SEEK IMMEDIATE MEDICAL CARE IF:    You have severe back pain or lower abdominal pain.   You develop chills.   You have nausea or vomiting.   You have continued burning or discomfort with urination.

## 2012-05-03 NOTE — ED Provider Notes (Signed)
History     CSN: [id number]  Arrival date & time 05/03/12  1054   First MD Initiated Contact with Patient 05/03/12 1120      Chief Complaint  Patient presents with  . Flank Pain    (Consider location/radiation/quality/duration/timing/severity/associated sxs/prior treatment) HPI Comments: Patient reports that she is having left flank pain. She reports that she first noticed the pain several weeks ago. Pain has progressively worsened. Pain is now sharp, stabbing and severe in the left lower back region. He radiates around towards the lower abdomen and leg area. She reports that the pain worsens with any slight movement. Moving the hip and leg, walking, bending, twisting and even breathing causes the pain to worsen. There is no chest pain or shortness of breath. She has had some urinary incontinence with these symptoms. Has not noticed any hematuria. No fever, nausea or vomiting. It does burn sometimes when she urinates.  Patient is a 47 y.o. female presenting with flank pain.  Flank Pain Pertinent negatives include no chest pain and no shortness of breath.    Past Medical History  Diagnosis Date  . Diabetes mellitus   . Headache     migraines  . GERD (gastroesophageal reflux disease)   . Hypertension     Patient denies, no BP med , d/c'd by MD    Past Surgical History  Procedure Laterality Date  . Hysteroscopy w/d&c  12/18/2011    Procedure: DILATATION AND CURETTAGE /HYSTEROSCOPY;  Surgeon: Antionette Char, MD;  Location: [facility];  Service: Gynecology;  Laterality: N/A;  . Colposcopy  12/18/2011    Procedure: COLPOSCOPY;  Surgeon: Antionette Char, MD;  Location: [facility];  Service: Gynecology;  Laterality: N/A;    Family History  Problem Relation Age of Onset  . Diabetes Mother   . Hypertension Mother     History  Substance Use Topics  . Smoking status: Never Smoker   . Smokeless tobacco: Not on file  . Alcohol Use: No    OB History   Grav Para Term Preterm  Abortions TAB SAB Ect Mult Living   4 4              Review of Systems  Constitutional: Negative for fever.  Respiratory: Negative for shortness of breath.   Cardiovascular: Negative for chest pain.  Gastrointestinal: Negative.   Genitourinary: Positive for dysuria and flank pain.  All other systems reviewed and are negative.    Allergies  Review of patient's allergies indicates no known allergies.  Home Medications   Current Outpatient Rx  Name  Route  Sig  Dispense  Refill  . acetaminophen (TYLENOL) 500 MG tablet   Oral   Take 1,000 mg by mouth every 6 (six) hours as needed. For pain.         Marland Kitchen albuterol (PROVENTIL HFA;VENTOLIN HFA) 108 (90 BASE) MCG/ACT inhaler   Inhalation   Inhale 2 puffs into the lungs every 6 (six) hours as needed. For shortness of breath/wheezing/asthma         . aspirin EC 81 MG tablet   Oral   Take 81 mg by mouth daily.         Marland Kitchen esomeprazole (NEXIUM) 40 MG packet   Oral   Take 40 mg by mouth daily before breakfast.           . metFORMIN (GLUCOPHAGE) 850 MG tablet   Oral   Take 850 mg by mouth 2 (two) times daily with a meal.           .  Multiple Vitamin (MULTIVITAMIN WITH MINERALS) TABS   Oral   Take 1 tablet by mouth daily.         . sitaGLIPtan-metformin (JANUMET) 50-500 MG per tablet   Oral   Take 1 tablet by mouth 2 (two) times daily with a meal.         . topiramate (TOPAMAX) 50 MG tablet   Oral   Take 200 mg by mouth at bedtime.            BP 158/88  Pulse 119  Temp(Src) 98.3 F (36.8 C) (Oral)  Resp 22  SpO2 97%  LMP 04/04/2012  Physical Exam  Constitutional: She is oriented to person, place, and time. She appears well-developed and well-nourished. No distress.  HENT:  Head: Normocephalic and atraumatic.  Right Ear: Hearing normal.  Nose: Nose normal.  Mouth/Throat: Oropharynx is clear and moist and mucous membranes are normal.  Eyes: Conjunctivae and EOM are normal. Pupils are equal, round, and  reactive to light.  Neck: Normal range of motion. Neck supple.  Cardiovascular: Normal rate, regular rhythm, S1 normal and S2 normal.  Exam reveals no gallop and no friction rub.   No murmur heard. Pulmonary/Chest: Effort normal and breath sounds normal. No respiratory distress. She exhibits no tenderness.  Abdominal: Soft. Normal appearance and bowel sounds are normal. There is no hepatosplenomegaly. There is no tenderness. There is no rebound, no guarding, no tenderness at McBurney's point and negative Murphy's sign. No hernia.  Musculoskeletal: Normal range of motion.       Lumbar back: She exhibits no tenderness.       Back:  No midline lumbar tenderness, but there is diffuse left lower lumbar paraspinal tenderness and severe pain with active range of motion of the leg  Neurological: She is alert and oriented to person, place, and time. She has normal strength. No cranial nerve deficit or sensory deficit. Coordination normal. GCS eye subscore is 4. GCS verbal subscore is 5. GCS motor subscore is 6.  Skin: Skin is warm, dry and intact. No rash noted. No cyanosis.  Psychiatric: She has a normal mood and affect. Her speech is normal and behavior is normal. Thought content normal.    ED Course  Procedures (including critical care time)  Labs Reviewed  URINALYSIS, ROUTINE W REFLEX MICROSCOPIC - Abnormal; Notable for the following:    APPearance CLOUDY (*)    Specific Gravity, Urine 1.033 (*)    Bilirubin Urine SMALL (*)    Ketones, ur 15 (*)    Protein, ur 30 (*)    Leukocytes, UA MODERATE (*)    All other components within normal limits  URINE MICROSCOPIC-ADD ON - Abnormal; Notable for the following:    Squamous Epithelial / LPF MANY (*)    Bacteria, UA MANY (*)    All other components within normal limits  POCT I-STAT, CHEM 8 - Abnormal; Notable for the following:    Glucose, Bld 184 (*)    All other components within normal limits  URINE CULTURE  CBC WITH DIFFERENTIAL   No  results found.   Diagnosis: 1. UTI 2. Back pain    MDM  Patient comes to the ER with complaints of left lower back pain that does appear to be very musculoskeletal in nature. Patient has severe increases in the pain with minimal movement of the lower back and torso area. She has normal strength, sensation in the lower extremities. No neurologic deficit. She has, however, been experiencing urinary incontinence and dysuria.  Urinalysis is consistent with infection. Patient will be treated for urinary tract infection as well as musculoskeletal back pain.        Gilda Crease, MD 05/03/12 [phone]

## 2012-05-03 NOTE — ED Notes (Signed)
Pt c/o left flank pain, worse with urination. Pt c/o urinary frequency and urgency. Pt states she also has vaginal d/c, clear/white in color and has a strong, foul odor.

## 2012-05-04 LAB — URINE CULTURE
Colony Count: NO GROWTH
Culture: NO GROWTH

## 2012-05-06 ENCOUNTER — Ambulatory Visit: Payer: Medicaid Other | Admitting: *Deleted

## 2012-05-06 ENCOUNTER — Ambulatory Visit: Payer: Medicaid Other | Admitting: Dietician

## 2012-05-09 ENCOUNTER — Ambulatory Visit: Payer: Self-pay | Admitting: Obstetrics & Gynecology

## 2012-05-10 ENCOUNTER — Other Ambulatory Visit: Payer: Self-pay | Admitting: Family Medicine

## 2012-05-10 DIAGNOSIS — Z1231 Encounter for screening mammogram for malignant neoplasm of breast: Secondary | ICD-10-CM

## 2012-05-11 ENCOUNTER — Ambulatory Visit: Payer: Self-pay | Admitting: Obstetrics & Gynecology

## 2012-05-13 ENCOUNTER — Ambulatory Visit: Payer: Medicaid Other | Admitting: Dietician

## 2012-05-18 ENCOUNTER — Encounter: Payer: Self-pay | Admitting: *Deleted

## 2012-05-18 ENCOUNTER — Encounter: Payer: Medicaid Other | Attending: Family Medicine | Admitting: *Deleted

## 2012-05-18 VITALS — Ht 68.0 in | Wt 393.5 lb

## 2012-05-18 DIAGNOSIS — E669 Obesity, unspecified: Secondary | ICD-10-CM

## 2012-05-18 DIAGNOSIS — Z713 Dietary counseling and surveillance: Secondary | ICD-10-CM | POA: Insufficient documentation

## 2012-05-18 DIAGNOSIS — E119 Type 2 diabetes mellitus without complications: Secondary | ICD-10-CM | POA: Insufficient documentation

## 2012-05-18 NOTE — Progress Notes (Signed)
Diabetes Self-Management Education  Visit Number: First/Initial  05/18/2012 [name], identified by name and date of birth, is a 47 y.o. female with Diabetes Type: Type 2.  Other people present during visit:  Patient   ASSESSMENT  Patient Concerns:  Nutrition/Meal planning;Medication;Monitoring;Healthy Lifestyle;Problem Solving;Glycemic Control;Weight Control;Support  Height 5\' 8"  (1.727 m), weight 393 lb 8 oz (178.49 kg), last menstrual period 04/04/2012. Body mass index is 59.85 kg/(m^2).  Lab Results: No results found for this basename: LDLCALC, HGBA1C.  Patient reports last HbA1c of 12%     Family History  Problem Relation Age of Onset  . Diabetes Mother   . Hypertension Mother    History  Substance Use Topics  . Smoking status: Never Smoker   . Smokeless tobacco: Not on file  . Alcohol Use: No    Support Systems:  Lives Alone Special Needs:  None  Prior DM Education:   Daily Foot Exams: No Patient Belief / Attitude about Diabetes:  Diabetes can be controlled   Education Topics Reviewed with Patient Today:  Topic Points Discussed  Disease State Definition of diabetes, type 1 and 2, and the diagnosis of diabetes;Factors that contribute to the development of diabetes;Explored patient's options for treatment of their diabetes  Nutrition Management Role of diet in the treatment of diabetes and the relationship between the three main macronutrients and blood glucose level;Food label reading, portion sizes and measuring food.;Carbohydrate counting;Reviewed blood glucose goals for pre and post meals and how to evaluate the patients' food intake on their blood glucose level.;Meal timing in regards to the patients' current diabetes medication.;Effects of alcohol on blood glucose and safety factors with consumption of alcohol.;Information on hints to eating out and maintain blood glucose control.;Meal options for control of blood glucose level and chronic complications.   Physical Activity and Exercise Role of exercise on diabetes management, blood pressure control and cardiac health.  Medications Reviewed patients medication for diabetes, action, purpose, timing of dose and side effects.  Monitoring Interpreting lab values - A1C, lipid, urine microalbumina.;Identified appropriate SMBG and A1C goals.;Daily foot exams;Yearly dilated eye exam  Acute Complications    Chronic Complications Dental care;Assessed and discussed foot care and prevention of foot problems;Retinopathy and reason for yearly dilated eye exams  Psychosocial Adjustment    Goal Setting    Preconception Care (if applicable)        Education material provided:   Living Well with Diabetes  Carb Counting and Food Label handouts  Meal Plan Card   If problems or questions, patient to contact team via:  Phone and Email  Time in: 1530     Time out: 1630  Future DSME appointment: -  1 month   Goodman, Sarah Reel 05/18/2012 4:27 PM

## 2012-05-18 NOTE — Patient Instructions (Addendum)
[facility] [phone] accept Medicaid   Goals:  Follow Diabetes Meal Plan as instructed  Eat 3 meals and 2 snacks, every 3-5 hrs  Limit carbohydrate intake to 45 grams carbohydrate/meal  Limit carbohydrate intake to 15 grams carbohydrate/snack  Add lean protein foods to meals/snacks  Monitor glucose levels as instructed by your doctor  Aim for 30 mins of physical activity daily  Bring food record and glucose log to your next nutrition visit

## 2012-05-23 ENCOUNTER — Inpatient Hospital Stay (HOSPITAL_COMMUNITY)
Admission: EM | Admit: 2012-05-23 | Discharge: 2012-05-27 | DRG: 287 | Disposition: A | Discharge status: Home / Self Care | Payer: Medicaid Other | Attending: Internal Medicine | Admitting: Internal Medicine

## 2012-05-23 ENCOUNTER — Emergency Department (HOSPITAL_COMMUNITY): Payer: Medicaid Other

## 2012-05-23 ENCOUNTER — Encounter (HOSPITAL_COMMUNITY): Payer: Self-pay | Admitting: Emergency Medicine

## 2012-05-23 DIAGNOSIS — Z8249 Family history of ischemic heart disease and other diseases of the circulatory system: Secondary | ICD-10-CM

## 2012-05-23 DIAGNOSIS — N390 Urinary tract infection, site not specified: Secondary | ICD-10-CM

## 2012-05-23 DIAGNOSIS — H81399 Other peripheral vertigo, unspecified ear: Secondary | ICD-10-CM | POA: Diagnosis present

## 2012-05-23 DIAGNOSIS — I2089 Other forms of angina pectoris: Secondary | ICD-10-CM

## 2012-05-23 DIAGNOSIS — I200 Unstable angina: Secondary | ICD-10-CM

## 2012-05-23 DIAGNOSIS — R0789 Other chest pain: Principal | ICD-10-CM

## 2012-05-23 DIAGNOSIS — E1165 Type 2 diabetes mellitus with hyperglycemia: Secondary | ICD-10-CM | POA: Diagnosis present

## 2012-05-23 DIAGNOSIS — R87612 Low grade squamous intraepithelial lesion on cytologic smear of cervix (LGSIL): Secondary | ICD-10-CM

## 2012-05-23 DIAGNOSIS — Z6841 Body Mass Index (BMI) 40.0 and over, adult: Secondary | ICD-10-CM

## 2012-05-23 DIAGNOSIS — IMO0001 Reserved for inherently not codable concepts without codable children: Secondary | ICD-10-CM | POA: Diagnosis present

## 2012-05-23 DIAGNOSIS — N939 Abnormal uterine and vaginal bleeding, unspecified: Secondary | ICD-10-CM

## 2012-05-23 DIAGNOSIS — E119 Type 2 diabetes mellitus without complications: Secondary | ICD-10-CM

## 2012-05-23 DIAGNOSIS — I208 Other forms of angina pectoris: Secondary | ICD-10-CM

## 2012-05-23 DIAGNOSIS — K219 Gastro-esophageal reflux disease without esophagitis: Secondary | ICD-10-CM

## 2012-05-23 DIAGNOSIS — R42 Dizziness and giddiness: Secondary | ICD-10-CM

## 2012-05-23 DIAGNOSIS — E860 Dehydration: Secondary | ICD-10-CM

## 2012-05-23 DIAGNOSIS — IMO0002 Reserved for concepts with insufficient information to code with codable children: Secondary | ICD-10-CM | POA: Diagnosis present

## 2012-05-23 LAB — BASIC METABOLIC PANEL
BUN: 11 mg/dL (ref 6–23)
CO2: 25 mEq/L (ref 19–32)
Calcium: 9.8 mg/dL (ref 8.4–10.5)
Chloride: 93 mEq/L — ABNORMAL LOW (ref 96–112)
Creatinine, Ser: 0.62 mg/dL (ref 0.50–1.10)
GFR calc Af Amer: >90 mL/min (ref >90)
GFR calc non Af Amer: >90 mL/min (ref >90)
Glucose, Bld: 211 mg/dL — ABNORMAL HIGH (ref 70–99)
Potassium: 3.8 mEq/L (ref 3.5–5.1)
Sodium: 130 mEq/L — ABNORMAL LOW (ref 135–145)

## 2012-05-23 LAB — CBC
HCT: 35.4 % — ABNORMAL LOW (ref 36.0–46.0)
Hemoglobin: 12.3 g/dL (ref 12.0–15.0)
MCH: 28.2 pg (ref 26.0–34.0)
MCHC: 34.7 g/dL (ref 30.0–36.0)
MCV: 81.2 fL (ref 78.0–100.0)
Platelets: 354 10*3/uL (ref 150–400)
RBC: 4.36 MIL/uL (ref 3.87–5.11)
RDW: 14.9 % (ref 11.5–15.5)
WBC: 8.3 10*3/uL (ref 4.0–10.5)

## 2012-05-23 LAB — POCT I-STAT TROPONIN I: Troponin i, poc: 0 ng/mL (ref 0.00–0.08)

## 2012-05-23 LAB — PRO B NATRIURETIC PEPTIDE: Pro B Natriuretic peptide (BNP): 16.1 pg/mL (ref 0–125)

## 2012-05-23 LAB — D-DIMER, QUANTITATIVE: D-Dimer, Quant: 1.22 ug/mL-FEU — ABNORMAL HIGH (ref 0.00–0.48)

## 2012-05-23 LAB — TROPONIN I
Troponin I: <0.3 ng/mL (ref <0.30)
Troponin I: <0.3 ng/mL (ref <0.30)

## 2012-05-23 MED ORDER — ALBUTEROL SULFATE HFA 108 (90 BASE) MCG/ACT IN AERS
2.0000 | INHALATION_SPRAY | Freq: Three times a day (TID) | RESPIRATORY_TRACT | Status: DC | PRN
  Filled 2012-05-23: qty 6.7

## 2012-05-23 MED ORDER — ASPIRIN EC 81 MG PO TBEC
81.0000 mg | DELAYED_RELEASE_TABLET | Freq: Every day | ORAL | Status: DC
  Administered 2012-05-24 – 2012-05-25 (×2): 81 mg via ORAL
  Filled 2012-05-23 (×3): qty 1

## 2012-05-23 MED ORDER — IOHEXOL 350 MG/ML SOLN
60.0000 mL | Freq: Once | INTRAVENOUS | Status: AC | PRN
  Administered 2012-05-23: 60 mL via INTRAVENOUS

## 2012-05-23 MED ORDER — INSULIN ASPART 100 UNIT/ML ~~LOC~~ SOLN
0.0000 [IU] | Freq: Three times a day (TID) | SUBCUTANEOUS | Status: DC
  Administered 2012-05-24: 4 [IU] via SUBCUTANEOUS
  Administered 2012-05-24: 11 [IU] via SUBCUTANEOUS
  Administered 2012-05-24: 7 [IU] via SUBCUTANEOUS
  Administered 2012-05-25: 15 [IU] via SUBCUTANEOUS
  Administered 2012-05-25 (×2): 7 [IU] via SUBCUTANEOUS
  Administered 2012-05-26: 15 [IU] via SUBCUTANEOUS

## 2012-05-23 MED ORDER — HEPARIN SODIUM (PORCINE) 5000 UNIT/ML IJ SOLN
5000.0000 [IU] | Freq: Three times a day (TID) | INTRAMUSCULAR | Status: DC
  Administered 2012-05-23 – 2012-05-26 (×8): 5000 [IU] via SUBCUTANEOUS
  Filled 2012-05-23 (×11): qty 1

## 2012-05-23 MED ORDER — ASPIRIN 81 MG PO CHEW
324.0000 mg | CHEWABLE_TABLET | Freq: Once | ORAL | Status: AC
  Administered 2012-05-23: 324 mg via ORAL
  Filled 2012-05-23: qty 4

## 2012-05-23 MED ORDER — NITROGLYCERIN 0.4 MG SL SUBL
0.4000 mg | SUBLINGUAL_TABLET | SUBLINGUAL | Status: DC | PRN
  Administered 2012-05-23: 0.4 mg via SUBLINGUAL
  Filled 2012-05-23: qty 75

## 2012-05-23 MED ORDER — TOPIRAMATE 100 MG PO TABS
200.0000 mg | ORAL_TABLET | Freq: Every day | ORAL | Status: DC
  Administered 2012-05-24 – 2012-05-26 (×3): 200 mg via ORAL
  Filled 2012-05-23 (×5): qty 2

## 2012-05-23 MED ORDER — SODIUM CHLORIDE 0.9 % IJ SOLN
3.0000 mL | Freq: Two times a day (BID) | INTRAMUSCULAR | Status: DC
  Administered 2012-05-24 – 2012-05-25 (×2): 3 mL via INTRAVENOUS

## 2012-05-23 MED ORDER — IBUPROFEN 800 MG PO TABS
800.0000 mg | ORAL_TABLET | Freq: Three times a day (TID) | ORAL | Status: DC
  Administered 2012-05-23 – 2012-05-27 (×10): 800 mg via ORAL
  Filled 2012-05-23 (×15): qty 1

## 2012-05-23 MED ORDER — ADULT MULTIVITAMIN W/MINERALS CH
1.0000 | ORAL_TABLET | Freq: Every day | ORAL | Status: DC
  Administered 2012-05-24 – 2012-05-27 (×3): 1 via ORAL
  Filled 2012-05-23 (×6): qty 1

## 2012-05-23 MED ORDER — PANTOPRAZOLE SODIUM 40 MG PO TBEC
40.0000 mg | DELAYED_RELEASE_TABLET | Freq: Two times a day (BID) | ORAL | Status: DC
  Administered 2012-05-23 – 2012-05-26 (×6): 40 mg via ORAL
  Filled 2012-05-23 (×6): qty 1

## 2012-05-23 NOTE — H&P (Addendum)
[facility] History and Physical  Sarah Goodman XBJ:[id number] DOB: [birth year] DOA: 05/23/2012  Referring physician: ED PCP: [name], MD  Specialists: [facility]  Chief Complaint: vertigo, chest pain  HPI: Sarah Goodman is a 47 y.o. female who presents to the ED with c/o chest pain.  Located in the substernal area and left chest, quality is aching, dull, and tight, no radiation, moderate in severity, gradual in onset over the past few weeks.  Intermittent with 10 min duration episodes but has been getting worse.  She thinks that it is worse when she is up and active but isnt 100% clear on this.  Regardless she has significant risk factors including a mother who had CAD with "stenting to a blockage in her heart" in her 85s, and the patient also has DM with "very high A1C at last check" she thinks it was 12.  In the ED trops were negative, CTA PE was negative, cardiology saw the patient and recommended serial troponin's for now to r/o ACS (hospitalist admitting patient to do this), however noted that due to patient's morbid obesity patient likely not a good candidate for stress test and they are not sure they can do a cath either.  Review of Systems: 12 systems reviewed, positive for vertigo occuring with some of these attacks, otherwise negative.  Past Medical History  Diagnosis Date  . Diabetes mellitus   . Headache     migraines  . GERD (gastroesophageal reflux disease)   . Hypertension     Patient denies, no BP med , d/c'd by MD   Past Surgical History  Procedure Laterality Date  . Hysteroscopy w/d&c  12/18/2011    Procedure: DILATATION AND CURETTAGE /HYSTEROSCOPY;  Surgeon: Antionette Char, MD;  Location: [facility];  Service: Gynecology;  Laterality: N/A;  . Colposcopy  12/18/2011    Procedure: COLPOSCOPY;  Surgeon: Antionette Char, MD;  Location: [facility];  Service: Gynecology;  Laterality: N/A;   Social History:  reports that she has never smoked. She does not  have any smokeless tobacco history on file. She reports that she does not drink alcohol or use illicit drugs.   No Known Allergies  Family History  Problem Relation Age of Onset  . Diabetes Mother   . Hypertension Mother   . CAD Mother 11    Early onset CAD, with "blockage in artery and stent in heart"    Prior to Admission medications   Medication Sig Start Date End Date Taking? Authorizing Provider  albuterol (PROVENTIL HFA;VENTOLIN HFA) 108 (90 BASE) MCG/ACT inhaler Inhale 2 puffs into the lungs 3 (three) times daily as needed for wheezing or shortness of breath.    Yes Historical Provider, MD  aspirin EC 81 MG tablet Take 81 mg by mouth daily.   Yes Historical Provider, MD  ibuprofen (ADVIL,MOTRIN) 800 MG tablet Take 1 tablet (800 mg total) by mouth 3 (three) times daily. 05/03/12  Yes Gilda Crease, MD  metFORMIN (GLUCOPHAGE) 850 MG tablet Take 850 mg by mouth 2 (two) times daily with a meal.     Yes Historical Provider, MD  Multiple Vitamin (MULTIVITAMIN WITH MINERALS) TABS Take 1 tablet by mouth daily.   Yes Historical Provider, MD  omeprazole (PRILOSEC) 20 MG capsule Take 20 mg by mouth 2 (two) times daily.   Yes Historical Provider, MD  OMEPRAZOLE PO Take 1 capsule by mouth 2 (two) times daily.   Yes Historical Provider, MD  sitaGLIPtan-metformin (JANUMET) 50-500 MG per tablet Take 1  tablet by mouth 2 (two) times daily with a meal.   Yes Historical Provider, MD  topiramate (TOPAMAX) 50 MG tablet Take 200 mg by mouth at bedtime.    Yes Historical Provider, MD   Physical Exam: Filed Vitals:   05/23/12 1730 05/23/12 1745 05/23/12 1853 05/23/12 1900  BP: 129/81 126/78 129/75 133/79  Pulse: 99 98 112 96  Temp:      TempSrc:      Resp: 35 23  29  SpO2: 95% 99% 98% 97%    General:  NAD, resting comfortably in bed Eyes: PEERLA EOMI ENT: mucous membranes moist Neck: supple w/o JVD Cardiovascular: RRR w/o MRG Respiratory: CTA B Abdomen: soft, nt, morbidly obese,  bs+ Skin: no rash nor lesion Musculoskeletal: MAE, full ROM all 4 extremities Psychiatric: normal tone and affect Neurologic: AAOx3, grossly non-focal  Labs on Admission:  Basic Metabolic Panel:  Recent Labs Lab 05/23/12 1504  NA 130*  K 3.8  CL 93*  CO2 25  GLUCOSE 211*  BUN 11  CREATININE 0.62  CALCIUM 9.8   Liver Function Tests: No results found for this basename: AST, ALT, ALKPHOS, BILITOT, PROT, ALBUMIN,  in the last 168 hours No results found for this basename: LIPASE, AMYLASE,  in the last 168 hours No results found for this basename: AMMONIA,  in the last 168 hours CBC:  Recent Labs Lab 05/23/12 1504  WBC 8.3  HGB 12.3  HCT 35.4*  MCV 81.2  PLT 354   Cardiac Enzymes:  Recent Labs Lab 05/23/12 1836  TROPONINI <0.30    BNP (last 3 results)  Recent Labs  05/23/12 1504  PROBNP 16.1   CBG: No results found for this basename: GLUCAP,  in the last 168 hours  Radiological Exams on Admission: Dg Chest 2 View  05/23/2012  *RADIOLOGY REPORT*  Clinical Data: Weakness and dizziness.  Shortness of breath. Hypertension and diabetes.  CHEST - 2 VIEW  Comparison: 01/28/2010  Findings: Heart size is normal.  Mediastinal shadows are normal. Lungs are clear.  No effusions.  Vascularity is normal.  No significant bony finding.  IMPRESSION: No active disease   Original Report Authenticated By: Paulina Fusi, M.D.    Ct Angio Chest W/cm &/or Wo Cm  05/23/2012  *RADIOLOGY REPORT*  Clinical Data: Chest pain and shortness of breath.  CT ANGIOGRAPHY CHEST  Technique:  Multidetector CT imaging of the chest using the standard protocol during bolus administration of intravenous contrast. Multiplanar reconstructed images including MIPs were obtained and reviewed to evaluate the vascular anatomy.  Contrast: 60mL OMNIPAQUE IOHEXOL 350 MG/ML SOLN  Comparison: None.  Findings: The chest wall is unremarkable.  No breast masses, supraclavicular or axillary lymphadenopathy.  The bony  thorax is intact.  The heart is normal in size.  No pericardial effusion.  No mediastinal or hilar lymphadenopathy.  There are scattered lymph nodes.  The esophagus is grossly normal.  The aorta is normal in caliber.  No dissection.  The pulmonary arterial tree is well opacified.  No filling defects to suggest pulmonary emboli.  Examination of the lung parenchyma demonstrates a patchy ill- defined perihilar/right upper lobe and right lower lobe airspace opacity which is most likely developing bronchopneumonia.  No worrisome mass lesions or nodules.  No pleural effusion.  IMPRESSION:  1.  No CT findings for pulmonary embolism. 2.  Normal thoracic aorta. 3.  Scattered mediastinal and hilar lymph nodes. 4.  Patchy right upper lobe and right lower lobe infiltrates.   Original Report Authenticated  By: Rudie Meyer, M.D.     EKG: Independently reviewed.  Assessment/Plan Principal Problem:   Chest discomfort Active Problems:   Vertigo   1. Chest Pain - In the ED trops were negative, CTA PE was negative, cardiology saw the patient and recommended serial troponins for now to r/o ACS (hospitalist admitting patient to do this), however noted that due to patient's morbid obesity patient likely not a good candidate for stress test and they are not sure they can do a cath either.  Will go ahead and admit patient, get serial troponins, no active chest pain at this time, feel that this more likely represents a chronic development of blockage (UA rather than ACS), and will let cardiology figure out what to do regarding full ischemic evaluation. 2. DM2 - checking A1C holding metformin and putting on high dose SSI, A1C pending 3. Vertigo - likely acute onset peripheral vertigo, symptomatic management.  Cardiology saw patient in ED, please see their note.  Code Status: Full Code (must indicate code status--if unknown or must be presumed, indicate so) Family Communication: No family in room (indicate person spoken  with, if applicable, with phone number if by telephone) Disposition Plan: Admit to inpatient (indicate anticipated LOS)  Time spent: 70 min  Haydon Dorris M. [facility] Pager [phone]  If 7PM-7AM, please contact night-coverage [email or web address] Password [email or web address] 05/23/2012, 7:51 PM

## 2012-05-23 NOTE — ED Provider Notes (Signed)
I saw and evaluated the patient, reviewed the resident's note and I agree with the findings and plan.   .Face to face Exam:  General:  Awake HEENT:  Atraumatic Resp:  Normal effort Abd:  Nondistended Neuro:No focal weakness    Nelia Shi, MD 05/23/12 2048

## 2012-05-23 NOTE — ED Notes (Signed)
Pt c/o left sided CP with SOB starting this am; pt sts nausea today also

## 2012-05-23 NOTE — ED Provider Notes (Signed)
History     CSN: [id number]  Arrival date & time 05/23/12  1422   First MD Initiated Contact with Patient 05/23/12 1533      Chief Complaint  Patient presents with  . Chest Pain  . Shortness of Breath    (Consider location/radiation/quality/duration/timing/severity/associated sxs/prior treatment) Patient is a 47 y.o. female presenting with chest pain and shortness of breath. The history is provided by the patient.  Chest Pain Pain location:  Substernal area Pain quality: aching, dull and tightness   Pain radiates to:  Does not radiate Pain radiates to the back: no   Pain severity:  Moderate Onset quality:  Gradual Duration:  10 minutes Timing:  Intermittent Progression:  Worsening Chronicity:  New Context: stress   Context: not breathing   Relieved by:  None tried Worsened by:  Exertion Ineffective treatments:  None tried Associated symptoms: nausea and shortness of breath   Associated symptoms: no abdominal pain, no cough, no fever, no palpitations and not vomiting   Shortness of Breath Associated symptoms: no abdominal pain, no chest pain, no cough, no fever, no neck pain, no vomiting and no wheezing     Past Medical History  Diagnosis Date  . Diabetes mellitus   . Headache     migraines  . GERD (gastroesophageal reflux disease)   . Hypertension     Patient denies, no BP med , d/c'd by MD    Past Surgical History  Procedure Laterality Date  . Hysteroscopy w/d&c  12/18/2011    Procedure: DILATATION AND CURETTAGE /HYSTEROSCOPY;  Surgeon: Antionette Char, MD;  Location: [facility];  Service: Gynecology;  Laterality: N/A;  . Colposcopy  12/18/2011    Procedure: COLPOSCOPY;  Surgeon: Antionette Char, MD;  Location: [facility];  Service: Gynecology;  Laterality: N/A;    Family History  Problem Relation Age of Onset  . Diabetes Mother   . Hypertension Mother     History  Substance Use Topics  . Smoking status: Never Smoker   . Smokeless tobacco: Not on file   . Alcohol Use: No    OB History   Grav Para Term Preterm Abortions TAB SAB Ect Mult Living   4 4              Review of Systems  Constitutional: Negative for fever, chills, activity change and appetite change.  HENT: Negative for neck pain and neck stiffness.   Respiratory: Positive for chest tightness and shortness of breath. Negative for cough and wheezing.   Cardiovascular: Negative for chest pain and palpitations.  Gastrointestinal: Positive for nausea. Negative for vomiting, abdominal pain, diarrhea and constipation.  Genitourinary: Negative for dysuria, decreased urine volume and difficulty urinating.  Skin: Negative for wound.  Neurological: Negative for seizures, syncope, facial asymmetry and light-headedness.  Psychiatric/Behavioral: Negative for confusion and agitation.  All other systems reviewed and are negative.    Allergies  Review of patient's allergies indicates no known allergies.  Home Medications   Current Outpatient Rx  Name  Route  Sig  Dispense  Refill  . acetaminophen (TYLENOL) 500 MG tablet   Oral   Take 1,000 mg by mouth every 6 (six) hours as needed. For pain.         Marland Kitchen albuterol (PROVENTIL HFA;VENTOLIN HFA) 108 (90 BASE) MCG/ACT inhaler   Inhalation   Inhale 2 puffs into the lungs every 6 (six) hours as needed. For shortness of breath/wheezing/asthma         . aspirin EC 81 MG tablet  Oral   Take 81 mg by mouth daily.         . cephALEXin (KEFLEX) 500 MG capsule   Oral   Take 1 capsule (500 mg total) by mouth 4 (four) times daily.   40 capsule   0   . esomeprazole (NEXIUM) 40 MG packet   Oral   Take 40 mg by mouth daily before breakfast.           . HYDROcodone-acetaminophen (NORCO/VICODIN) 5-325 MG per tablet   Oral   Take 2 tablets by mouth every 4 (four) hours as needed for pain.   15 tablet   0   . ibuprofen (ADVIL,MOTRIN) 800 MG tablet   Oral   Take 1 tablet (800 mg total) by mouth 3 (three) times daily.   21  tablet   0   . lansoprazole (PREVACID) 15 MG capsule   Oral   Take 15 mg by mouth daily.         . metFORMIN (GLUCOPHAGE) 850 MG tablet   Oral   Take 850 mg by mouth 2 (two) times daily with a meal.           . Multiple Vitamin (MULTIVITAMIN WITH MINERALS) TABS   Oral   Take 1 tablet by mouth daily.         . sitaGLIPtan-metformin (JANUMET) 50-500 MG per tablet   Oral   Take 1 tablet by mouth 2 (two) times daily with a meal.         . topiramate (TOPAMAX) 50 MG tablet   Oral   Take 200 mg by mouth at bedtime.            BP 129/76  Pulse 98  Temp(Src) 97.7 F (36.5 C) (Oral)  Resp 24  SpO2 96%  LMP 04/04/2012  Physical Exam  Nursing note and vitals reviewed. Constitutional: She is oriented to person, place, and time. She appears well-developed and well-nourished.  HENT:  Head: Normocephalic and atraumatic.  Right Ear: External ear normal.  Left Ear: External ear normal.  Nose: Nose normal.  Mouth/Throat: Oropharynx is clear and moist. No oropharyngeal exudate.  Eyes: Conjunctivae are normal.  Neck: Normal range of motion. Neck supple.  Cardiovascular: Normal rate, regular rhythm, normal heart sounds and intact distal pulses.  Exam reveals no gallop and no friction rub.   No murmur heard. Pulmonary/Chest: Effort normal. No respiratory distress. She has no wheezes. She has no rales. She exhibits no tenderness.  Abdominal: Soft. Bowel sounds are normal. She exhibits no distension and no mass. There is no tenderness. There is no rebound and no guarding.  Morbid obesity   Musculoskeletal: Normal range of motion. She exhibits no edema and no tenderness.  Neurological: She is alert and oriented to person, place, and time.  Skin: Skin is warm and dry.  Psychiatric: She has a normal mood and affect. Her behavior is normal. Judgment and thought content normal.    ED Course  Procedures (including critical care time)  Labs Reviewed  CBC - Abnormal; Notable for  the following:    HCT 35.4 (*)    All other components within normal limits  BASIC METABOLIC PANEL - Abnormal; Notable for the following:    Sodium 130 (*)    Chloride 93 (*)    Glucose, Bld 211 (*)    All other components within normal limits  D-DIMER, QUANTITATIVE - Abnormal; Notable for the following:    D-Dimer, Quant 1.22 (*)    All  other components within normal limits  PRO B NATRIURETIC PEPTIDE  TROPONIN I  CBC  BASIC METABOLIC PANEL  TROPONIN I  TROPONIN I  TROPONIN I  HEMOGLOBIN A1C  POCT I-STAT TROPONIN I   Dg Chest 2 View  05/23/2012  *RADIOLOGY REPORT*  Clinical Data: Weakness and dizziness.  Shortness of breath. Hypertension and diabetes.  CHEST - 2 VIEW  Comparison: 01/28/2010  Findings: Heart size is normal.  Mediastinal shadows are normal. Lungs are clear.  No effusions.  Vascularity is normal.  No significant bony finding.  IMPRESSION: No active disease   Original Report Authenticated By: Paulina Fusi, M.D.    Ct Angio Chest W/cm &/or Wo Cm  05/23/2012  *RADIOLOGY REPORT*  Clinical Data: Chest pain and shortness of breath.  CT ANGIOGRAPHY CHEST  Technique:  Multidetector CT imaging of the chest using the standard protocol during bolus administration of intravenous contrast. Multiplanar reconstructed images including MIPs were obtained and reviewed to evaluate the vascular anatomy.  Contrast: 60mL OMNIPAQUE IOHEXOL 350 MG/ML SOLN  Comparison: None.  Findings: The chest wall is unremarkable.  No breast masses, supraclavicular or axillary lymphadenopathy.  The bony thorax is intact.  The heart is normal in size.  No pericardial effusion.  No mediastinal or hilar lymphadenopathy.  There are scattered lymph nodes.  The esophagus is grossly normal.  The aorta is normal in caliber.  No dissection.  The pulmonary arterial tree is well opacified.  No filling defects to suggest pulmonary emboli.  Examination of the lung parenchyma demonstrates a patchy ill- defined perihilar/right  upper lobe and right lower lobe airspace opacity which is most likely developing bronchopneumonia.  No worrisome mass lesions or nodules.  No pleural effusion.  IMPRESSION:  1.  No CT findings for pulmonary embolism. 2.  Normal thoracic aorta. 3.  Scattered mediastinal and hilar lymph nodes. 4.  Patchy right upper lobe and right lower lobe infiltrates.   Original Report Authenticated By: Rudie Meyer, M.D.      1. Vertigo   2. Chest discomfort   3. DM2 (diabetes mellitus, type 2)   4. Unstable angina   5. Morbid obesity   6. Diabetes mellitus     EKG: Sinus tachycardia (103 bpm) with T wave inversion in isolated lead (III); there are no previous tracings available for comparison.  MDM  47 yo F presents for intermittent substernal chest pain, light-headedness, weakness, and nausea of 1 day. Currently with chest pain. No previous cardiac work-up. 325mg  ASA and SL NTG administered. EKG not c/w acute ischemia. Initial troponin negative. Cardiology consulted and evaluated in ED;  recommends serial troponins. Due to tachycardia, pt not PERC negative. D-dimer obtained and positive. Pt chest pain free after NTG. Chest CTA obtained and negative for evidence of PE. Hospitalist service consulted and will admit.   Clinical Impression: 1. Unstable angina   2. Morbid obesity   3. Diabetes mellitus            Clemetine Marker, MD 05/23/12 2045

## 2012-05-23 NOTE — ED Notes (Signed)
Pt alert, NAD, calm, interactive, resps e/u, speaking in clear complete sentences, VSS. denies pain, reports chest pressure 6/10, (denies: sob, nausea, dizziness or other sx), up to b/r with steady gait.

## 2012-05-23 NOTE — Consult Note (Addendum)
Admit date: 05/23/2012 Referring Physician  [facility] Emergency department Primary Physician  Renaye Rakers, M.D. Primary Cardiologist  none Reason for Consultation  chest discomfort  ASSESSMENT: 1. Dizziness and associated nausea most consistent with acute vertigo/labyrinthitis  2. Intermittent chest pressure without associated dyspnea or other complaints. No ECG changes are noted.  3. Morbid obesity  4. Diabetes mellitus  PLAN:  1. I would recommend serial cardiac markers and EKGs to exclude CAD/coronary syndrome  2. Ischemic evaluation this lady will be very difficult due to obesity. She is probably not a good fit for a CT scanner, nuclear imaging, or coronary angiography.  3. Consider neurological evaluation if felt indicated.   HPI: The patient awakened to go to the bathroom at 5 AM and became very dizzy and has severe nausea. She is 47 years of age and has a history of diabetes (2 years) and is followed by Dr. Renaye Rakers. About a week ago she began having a buzzing sound in one of her years. She has also noted since doll's head of dizziness that there has been a waxing and waning pressure in her chest. She characterizes the discomfort as being a 3 out 10 in intensity. The discomfort is lasting minutes and then resolving spontaneously. There is no associated dyspnea or other complaint. There is no radiation. Both the dizziness and chest discomfort are new.   PMH:   Past Medical History  Diagnosis Date  . Diabetes mellitus   . Headache     migraines  . GERD (gastroesophageal reflux disease)   . Hypertension     Patient denies, no BP med , d/c'd by MD     PSH:   Past Surgical History  Procedure Laterality Date  . Hysteroscopy w/d&c  12/18/2011    Procedure: DILATATION AND CURETTAGE /HYSTEROSCOPY;  Surgeon: Antionette Char, MD;  Location: [facility];  Service: Gynecology;  Laterality: N/A;  . Colposcopy  12/18/2011    Procedure: COLPOSCOPY;  Surgeon: Antionette Char,  MD;  Location: [facility];  Service: Gynecology;  Laterality: N/A;    Allergies:  Review of patient's allergies indicates no known allergies. Prior to Admit Meds:   (Not in a hospital admission) Fam HX:    Family History  Problem Relation Age of Onset  . Diabetes Mother   . Hypertension Mother    Social HX:    History   Social History  . Marital Status: Single    Spouse Name: N/A    Number of Children: N/A  . Years of Education: N/A   Occupational History  . Not on file.   Social History Main Topics  . Smoking status: Never Smoker   . Smokeless tobacco: Not on file  . Alcohol Use: No  . Drug Use: No  . Sexually Active:    Other Topics Concern  . Not on file   Social History Narrative  . No narrative on file     Review of Systems: No specific complaints of hypertension, heart disease, palpitations, stroke, kidney disease, ulcer disease, or lung disease.. She does have significant obesity, does not smoke or drink,.  Physical Exam: Blood pressure 129/76, pulse 98, temperature 97.7 F (36.5 C), temperature source Oral, resp. rate 24, last menstrual period 04/04/2012, SpO2 96.00%. Weight change:    HEENT: Unremarkable  NECK exam is difficult due to obesity. No obvious thyromegaly. No carotid bruits.  CHEST exam is unremarkable without wheezing or rales.  CARDIAC exam is unremarkable. No murmur, rub, click, or gallop is  heard.  ABDOMINAL exam is unremarkable without masses or bruits. No tenderness.  EXTREMITIES reveal no edema. Pulses are 2+ and symmetric.  NEUROLOGICAL exam reveals no focal deficits Labs:   Lab Results  Component Value Date   WBC 8.3 05/23/2012   HGB 12.3 05/23/2012   HCT 35.4* 05/23/2012   MCV 81.2 05/23/2012   PLT 354 05/23/2012    Recent Labs Lab 05/23/12 1504  NA 130*  K 3.8  CL 93*  CO2 25  BUN 11  CREATININE 0.62  CALCIUM 9.8  GLUCOSE 211*   Troponin (Point of Care Test)  Recent Labs  05/23/12 1517  TROPIPOC 0.00       Radiology:  Dg Chest 2 View  05/23/2012  *RADIOLOGY REPORT*  Clinical Data: Weakness and dizziness.  Shortness of breath. Hypertension and diabetes.  CHEST - 2 VIEW  Comparison: 01/28/2010  Findings: Heart size is normal.  Mediastinal shadows are normal. Lungs are clear.  No effusions.  Vascularity is normal.  No significant bony finding.  IMPRESSION: No active disease   Original Report Authenticated By: Paulina Fusi, M.D.    EKG:  Normal    Veatrice Kells W 05/23/2012 4:53 PM

## 2012-05-24 ENCOUNTER — Ambulatory Visit: Payer: Medicaid Other | Admitting: Obstetrics

## 2012-05-24 ENCOUNTER — Encounter: Payer: Self-pay | Admitting: *Deleted

## 2012-05-24 DIAGNOSIS — K219 Gastro-esophageal reflux disease without esophagitis: Secondary | ICD-10-CM | POA: Diagnosis present

## 2012-05-24 DIAGNOSIS — IMO0001 Reserved for inherently not codable concepts without codable children: Secondary | ICD-10-CM

## 2012-05-24 DIAGNOSIS — E860 Dehydration: Secondary | ICD-10-CM

## 2012-05-24 DIAGNOSIS — N390 Urinary tract infection, site not specified: Secondary | ICD-10-CM

## 2012-05-24 LAB — BASIC METABOLIC PANEL
BUN: 11 mg/dL (ref 6–23)
CO2: 24 mEq/L (ref 19–32)
Calcium: 9.6 mg/dL (ref 8.4–10.5)
Chloride: 94 mEq/L — ABNORMAL LOW (ref 96–112)
Creatinine, Ser: 0.67 mg/dL (ref 0.50–1.10)
GFR calc Af Amer: >90 mL/min (ref >90)
GFR calc non Af Amer: >90 mL/min (ref >90)
Glucose, Bld: 248 mg/dL — ABNORMAL HIGH (ref 70–99)
Potassium: 3.8 mEq/L (ref 3.5–5.1)
Sodium: 132 mEq/L — ABNORMAL LOW (ref 135–145)

## 2012-05-24 LAB — CBC
HCT: 33.5 % — ABNORMAL LOW (ref 36.0–46.0)
Hemoglobin: 11.7 g/dL — ABNORMAL LOW (ref 12.0–15.0)
MCH: 28.4 pg (ref 26.0–34.0)
MCHC: 34.9 g/dL (ref 30.0–36.0)
MCV: 81.3 fL (ref 78.0–100.0)
Platelets: 333 10*3/uL (ref 150–400)
RBC: 4.12 MIL/uL (ref 3.87–5.11)
RDW: 15.1 % (ref 11.5–15.5)
WBC: 9 10*3/uL (ref 4.0–10.5)

## 2012-05-24 LAB — HEMOGLOBIN A1C
Hgb A1c MFr Bld: 9.3 % — ABNORMAL HIGH (ref <5.7)
Mean Plasma Glucose: 220 mg/dL — ABNORMAL HIGH (ref <117)

## 2012-05-24 LAB — URINALYSIS, ROUTINE W REFLEX MICROSCOPIC
Bilirubin Urine: NEGATIVE
Glucose, UA: NEGATIVE mg/dL
Hgb urine dipstick: NEGATIVE
Ketones, ur: NEGATIVE mg/dL
Nitrite: NEGATIVE
Protein, ur: NEGATIVE mg/dL
Specific Gravity, Urine: 1.02 (ref 1.005–1.030)
Urobilinogen, UA: 1 mg/dL (ref 0.0–1.0)
pH: 5.5 (ref 5.0–8.0)

## 2012-05-24 LAB — MRSA PCR SCREENING: MRSA by PCR: NEGATIVE

## 2012-05-24 LAB — TROPONIN I
Troponin I: <0.3 ng/mL (ref <0.30)
Troponin I: <0.3 ng/mL (ref <0.30)

## 2012-05-24 LAB — GLUCOSE, CAPILLARY
Glucose-Capillary: 224 mg/dL — ABNORMAL HIGH (ref 70–99)
Glucose-Capillary: 288 mg/dL — ABNORMAL HIGH (ref 70–99)
Glucose-Capillary: 197 mg/dL — ABNORMAL HIGH (ref 70–99)
Glucose-Capillary: 232 mg/dL — ABNORMAL HIGH (ref 70–99)
Glucose-Capillary: 208 mg/dL — ABNORMAL HIGH (ref 70–99)

## 2012-05-24 LAB — URINE MICROSCOPIC-ADD ON

## 2012-05-24 MED ORDER — INSULIN PEN STARTER KIT
1.0000 | Freq: Once | Status: DC
  Filled 2012-05-24: qty 1

## 2012-05-24 MED ORDER — LINAGLIPTIN 5 MG PO TABS
5.0000 mg | ORAL_TABLET | Freq: Every day | ORAL | Status: DC
  Administered 2012-05-25 – 2012-05-27 (×2): 5 mg via ORAL
  Filled 2012-05-24 (×3): qty 1

## 2012-05-24 MED ORDER — INSULIN GLARGINE 100 UNIT/ML ~~LOC~~ SOLN
12.0000 [IU] | Freq: Every day | SUBCUTANEOUS | Status: DC
  Administered 2012-05-24: 12 [IU] via SUBCUTANEOUS
  Filled 2012-05-24 (×2): qty 0.12

## 2012-05-24 MED ORDER — MORPHINE SULFATE 2 MG/ML IJ SOLN: 1.0000 mg | INTRAMUSCULAR | Status: DC | PRN

## 2012-05-24 MED ORDER — METFORMIN HCL 850 MG PO TABS: 850.0000 mg | ORAL_TABLET | Freq: Two times a day (BID) | ORAL | Status: DC

## 2012-05-24 MED ORDER — SITAGLIPTIN PHOS-METFORMIN HCL 50-500 MG PO TABS: 1.0000 | ORAL_TABLET | Freq: Two times a day (BID) | ORAL | Status: DC

## 2012-05-24 MED ORDER — METFORMIN HCL 850 MG PO TABS
850.0000 mg | ORAL_TABLET | Freq: Two times a day (BID) | ORAL | Status: DC
  Filled 2012-05-24 (×3): qty 1

## 2012-05-24 MED ORDER — METFORMIN HCL 500 MG PO TABS
500.0000 mg | ORAL_TABLET | Freq: Two times a day (BID) | ORAL | Status: DC
  Administered 2012-05-24: 500 mg via ORAL
  Filled 2012-05-24 (×5): qty 1

## 2012-05-24 NOTE — Progress Notes (Signed)
[facility] Progress Note [place] TEAM 1 - Stepdown/ICU TEAM   JAMARIAH TONY ION:[id number] DOB: 1965/09/22 DOA: 05/23/2012 PCP: [name], MD  Brief narrative: 47 year old female patient with known uncontrolled diabetes. Presented to the emergency department with complaints of substernal chest pain primarily on the left. Described as aching dull and tight without radiation. Gradual onset over for a few weeks. Patient thinks that the pain may be worse when she is up and active but was nonspecific regarding this. Her primary concern is that she has significant risk factors including a mother with stents to blockage in heart in her 16s and her uncontrolled diabetes. In the emergency department her troponins and EKG were negative. CTA of the chest revealed no evidence of pulmonary embolism. Cardiology also evaluated the patient in the emergency department and recommended continuing medical rule out with serial labs/ecg. In addition it was felt the patient would not be a good candidate for conventional stress testing due to her morbid obesity. Because of this a nonemergent cardiac catheterization would be risky as well in setting of patient without any truly convincing clinical signs of coronary disease.  Assessment/Plan: Active Problems:   Chest discomfort -so far ischemic WU negative: enzymes and EKG have been normal -appreciate Cards assistance/Modified Bruce protocol stress treadmill pending -check ECHO    Diabetes mellitus type II, uncontrolled -HgbA1c 9 -pt reports poorly controlled diabetes despite med rxn > 87yrs -will resume home OHA's (beginning 4/16 since rec'd IV contrast) -add long acting insulin -pt confirms on BOTH metformin and the januvamet at home -cont SSI  -diabetes educator consult    Dehydration/Vertigo -suspect persistent hyperglycemia has precipitated dehydration -pt describes her sx's as primarily lightheadedness and no spinning sensation so not c/w  true vertigo -ensure adequate hydration and glycemic control    ? UTI (urinary tract infection) -may also explain some of her sx's -check UA and culture    GERD (gastroesophageal reflux disease) -may explain her "CP" as well -likely has early gastroparesis causing reflux sx's -monitor and if cardiac eval negative then give 1 month course PPI and follow sx resolution    Morbid obesity with body mass index of 50.0-59.9 in adult -nutrition consultation -pt endorsed 55 lb weight gain over past 6 months   DVT prophylaxis: Subcutaneous heparin Code Status: Full Family Communication: Patient Disposition Plan: Transfer to telemetry versus discharge later this afternoon Isolation: None  Consultants: Cardiology  Procedures: Treadmill stress test pending Echocardiogram pending  Antibiotics: None  HPI/Subjective: Patient continues to endorse intermittent chest heaviness bilaterally without any other associated symptoms.   Objective: Blood pressure 134/82, pulse 85, temperature 97.8 F (36.6 C), temperature source Oral, resp. rate 17, height 5\' 8"  (1.727 m), weight 176.9 kg (389 lb 15.9 oz), last menstrual period 05/18/2012, SpO2 95.00%.  Intake/Output Summary (Last 24 hours) at 05/24/12 1211 Last data filed at 05/24/12 0100  Gross per 24 hour  Intake      0 ml  Output    275 ml  Net   -275 ml     Exam: General: No acute respiratory distress, morbidly obese Lungs: Clear to auscultation bilaterally without wheezes or crackles, RA Cardiovascular: Regular rate and rhythm without murmur gallop or rub normal S1 and S2, trace peripheral nonpitting edema or JVD Abdomen: Nontender, obese yet nondistended, soft, bowel sounds positive, no rebound, no ascites, no appreciable mass Musculoskeletal: No significant cyanosis, clubbing of bilateral lower extremities Neurological: Alert and oriented x 3, moves all extremities x 4 without focal neurological  deficits, CN 2-13 intact  Data  Reviewed: Basic Metabolic Panel:  Recent Labs Lab 05/23/12 1504 05/24/12 0130  NA 130* 132*  K 3.8 3.8  CL 93* 94*  CO2 25 24  GLUCOSE 211* 248*  BUN 11 11  CREATININE 0.62 0.67  CALCIUM 9.8 9.6   Liver Function Tests: No results found for this basename: AST, ALT, ALKPHOS, BILITOT, PROT, ALBUMIN,  in the last 168 hours No results found for this basename: LIPASE, AMYLASE,  in the last 168 hours No results found for this basename: AMMONIA,  in the last 168 hours CBC:  Recent Labs Lab 05/23/12 1504 05/24/12 0130  WBC 8.3 9.0  HGB 12.3 11.7*  HCT 35.4* 33.5*  MCV 81.2 81.3  PLT 354 333   Cardiac Enzymes:  Recent Labs Lab 05/23/12 1836 05/23/12 2027 05/24/12 0130 05/24/12 0801  TROPONINI <0.30 <0.30 <0.30 <0.30   BNP (last 3 results)  Recent Labs  05/23/12 1504  PROBNP 16.1   CBG:  Recent Labs Lab 05/24/12 0053 05/24/12 0806  GLUCAP 224* 288*    Recent Results (from the past 240 hour(s))  MRSA PCR SCREENING     Status: None   Collection Time    05/23/12 11:17 PM      Result Value Range Status   MRSA by PCR NEGATIVE  NEGATIVE Final   Comment:            The GeneXpert MRSA Assay (FDA     approved for NASAL specimens     only), is one component of a     comprehensive MRSA colonization     surveillance program. It is not     intended to diagnose MRSA     infection nor to guide or     monitor treatment for     MRSA infections.     Studies:  Recent x-ray studies have been reviewed in detail by the Attending Physician  Scheduled Meds:  Reviewed in detail by the Attending Physician   Junious Silk, ANP [facility] Office  [phone] Pager [phone]  On-Call/Text Page:      [email or web address]      password [email or web address]  If 7PM-7AM, please contact night-coverage [email or web address] Password [facility] 05/24/2012, 12:11 PM   LOS: 1 day   I have examined the patient, reviewed the chart and modified the above note which I agree with.    Armetta Henri,MD [phone] 05/24/2012, 7:21 PM

## 2012-05-24 NOTE — Progress Notes (Signed)
Vague chest discomfort during the night.  Vital signs and cardiac markers are all unremarkable.  A.m. EKG has not yet been done.  His vertigo has resolved and patient able to walk. We will try to do a modified Bruce protocol exercise treadmill test.

## 2012-05-24 NOTE — Progress Notes (Signed)
Maren Reamer NP made aware of pt c/o mid sternal chest pressure with activity. Pt OOB to BSC.  Does not radiate. No SOB. VS obtained.

## 2012-05-24 NOTE — Progress Notes (Signed)
Inpatient Diabetes Program Recommendations  AACE/ADA: New Consensus Statement on Inpatient Glycemic Control (2013)  Target Ranges:  Prepandial:   less than 140 mg/dL      Peak postprandial:   less than 180 mg/dL (1-2 hours)      Critically ill patients:  140 - 180 mg/dL   Reason for Visit: Diabetes Consult  47 y.o. female who presents to the ED with c/o chest pain. Located in the substernal area and left chest, quality is aching, dull, and tight, no radiation, moderate in severity, gradual in onset over the past few weeks. Pt sees [name] for diabetes.  Checks blood sugars at home and recently been in the 200-300s.  On metformin 850 bid and tradjenta 5 mg QD.  Previously on Janumet 50/500 bid.  Sees nutritionist at [facility] for diabetes education.  Will likely need to go home on basal / bolus insulin.  Encouraged to view diabetes videos on pt ed channel.  Discussed importance of controlling blood sugars to prevent long-term complications.  Will order insulin pen starter kit and RN to begin teaching insulin administration if pt to go home on insulin.  Results for ANANDI, ABRAMO (MRN [id number]) as of 05/24/2012 16:18  Ref. Range 05/24/2012 01:30  Sodium Latest Range: 135-145 mEq/L 132 (L)  Potassium Latest Range: 3.5-5.1 mEq/L 3.8  Chloride Latest Range: 96-112 mEq/L 94 (L)  CO2 Latest Range: 19-32 mEq/L 24  BUN Latest Range: 6-23 mg/dL 11  Creatinine Latest Range: 0.50-1.10 mg/dL 3.66  Calcium Latest Range: 8.4-10.5 mg/dL 9.6  GFR calc non Af Amer Latest Range: >90 mL/min >90  GFR calc Af Amer Latest Range: >90 mL/min >90  Glucose Latest Range: 70-99 mg/dL 440 (H)  Results for YOSELIN, AMERMAN (MRN [id number]) as of 05/24/2012 16:18  Ref. Range 05/24/2012 00:53 05/24/2012 08:06  Glucose-Capillary Latest Range: 70-99 mg/dL 387 (H) 564 (H)  Results for JESSECA, MARSCH (MRN [id number]) as of 05/24/2012 16:18  Ref. Range 05/23/2012 20:27  Hemoglobin A1C Latest Range: <5.7 % 9.3 (H)    Uncontrolled DM in outpatient setting.   Inpatient Diabetes Program Recommendations Insulin - Basal: Increase Lantus to 20 units QAM.  Titrate up until FBS <140 mg/dL Correction (SSI): Add HS correction Insulin - Meal Coverage: Add Novolog 4 units tidwc if pt eats >50% meal HgbA1C: 9.3% on 05/23/2012 - Pt states this is improved from last HgbA1C  Note: Will f/u tomorrow am regarding insulin administration teaching.    Thank you. Ailene Ards, RD, LDN, CDE Inpatient Diabetes Coordinator [phone]

## 2012-05-24 NOTE — Progress Notes (Signed)
Received orders from Maren Reamer NP for treatment of CP. Holding medication at this time because pt is asleep.

## 2012-05-24 NOTE — Plan of Care (Signed)
Problem: Food- and Nutrition-Related Knowledge Deficit (NB-1.1) Goal: Nutrition education Formal process to instruct or train a patient/client in a skill or to impart knowledge to help patients/clients voluntarily manage or modify food choices and eating behavior to maintain or improve health. Outcome: Completed/Met Date Met:  05/24/12  RD consulted for nutrition education regarding weight loss and diabetes management.  Body mass index is 59.31 kg/(m^2). Pt meets criteria for morbidly obese based on current BMI.  RD notes pt recently met with [facility] RD for education re: wt loss and DM management. Met with pt to discuss topics and assist with compliance of recommendations.  Pt is able to recall several points from previous education and discuss ways she has implemented changes. Emphasized the importance of serving sizes and portion control which pt states she has been working on this past week.   Discussed importance of controlled and consistent intake throughout the day- reviewed CHO Modified diet likely to be ordered in the hospital and compared to principles discussed with [facility] RD. Provided examples of ways to balance meals/snacks and encouraged intake of high-fiber, whole grain complex carbohydrates. Emphasized the importance of hydration with calorie-free beverages and limiting sugar-sweetened beverages. Encouraged pt to discuss physical activity options with physician- pt currently reports walking and doing water aerobics. Teach back method used.   Expect good compliance.  Pt expresses motivation and states she has implemented global change in her household with all family members to promote health.  Pt with questions r/t to affects of diabetes on the body which are answered.  Current diet order is NPO for procedure. Labs and medications reviewed. No further nutrition interventions warranted at this time. RD contact information provided. If additional nutrition issues arise, please re-consult  RD.  Loyce Dys, MS RD LDN Clinical Inpatient Dietitian Pager: [phone] Weekend/After hours pager: [phone]

## 2012-05-25 LAB — URINE CULTURE
Colony Count: 8000
Special Requests: NORMAL

## 2012-05-25 LAB — GLUCOSE, CAPILLARY
Glucose-Capillary: 301 mg/dL — ABNORMAL HIGH (ref 70–99)
Glucose-Capillary: 244 mg/dL — ABNORMAL HIGH (ref 70–99)
Glucose-Capillary: 250 mg/dL — ABNORMAL HIGH (ref 70–99)
Glucose-Capillary: 256 mg/dL — ABNORMAL HIGH (ref 70–99)

## 2012-05-25 LAB — LIPID PANEL
Cholesterol: 156 mg/dL (ref 0–200)
HDL: 27 mg/dL — ABNORMAL LOW (ref >39)
LDL Cholesterol: 77 mg/dL (ref 0–99)
Total CHOL/HDL Ratio: 5.8 RATIO
Triglycerides: 261 mg/dL — ABNORMAL HIGH (ref <150)
VLDL: 52 mg/dL — ABNORMAL HIGH (ref 0–40)

## 2012-05-25 LAB — PROTIME-INR
INR: 0.98 (ref 0.00–1.49)
Prothrombin Time: 12.9 seconds (ref 11.6–15.2)

## 2012-05-25 MED ORDER — SODIUM CHLORIDE 0.9 % IV SOLN
1.0000 mL/kg/h | INTRAVENOUS | Status: DC
  Administered 2012-05-26: 1 mL/kg/h via INTRAVENOUS

## 2012-05-25 MED ORDER — ASPIRIN 81 MG PO CHEW
324.0000 mg | CHEWABLE_TABLET | ORAL | Status: AC
  Administered 2012-05-26: 324 mg via ORAL
  Filled 2012-05-25: qty 4

## 2012-05-25 MED ORDER — DEXTROSE 5 % IV SOLN
1.0000 g | INTRAVENOUS | Status: DC
  Administered 2012-05-25: 1 g via INTRAVENOUS
  Filled 2012-05-25 (×3): qty 10

## 2012-05-25 MED ORDER — SODIUM CHLORIDE 0.9 % IJ SOLN: 3.0000 mL | INTRAMUSCULAR | Status: DC | PRN

## 2012-05-25 MED ORDER — INSULIN ASPART 100 UNIT/ML ~~LOC~~ SOLN
4.0000 [IU] | Freq: Three times a day (TID) | SUBCUTANEOUS | Status: DC
  Administered 2012-05-25 – 2012-05-26 (×3): 4 [IU] via SUBCUTANEOUS

## 2012-05-25 MED ORDER — SODIUM CHLORIDE 0.9 % IV SOLN: 250.0000 mL | INTRAVENOUS | Status: DC | PRN

## 2012-05-25 MED ORDER — SODIUM CHLORIDE 0.9 % IJ SOLN
3.0000 mL | Freq: Two times a day (BID) | INTRAMUSCULAR | Status: DC
  Administered 2012-05-25 – 2012-05-26 (×3): 3 mL via INTRAVENOUS

## 2012-05-25 MED ORDER — INSULIN GLARGINE 100 UNIT/ML ~~LOC~~ SOLN
24.0000 [IU] | Freq: Every day | SUBCUTANEOUS | Status: DC
  Administered 2012-05-25 – 2012-05-27 (×3): 24 [IU] via SUBCUTANEOUS
  Filled 2012-05-25 (×4): qty 0.24

## 2012-05-25 MED ORDER — DIAZEPAM 5 MG PO TABS: 5.0000 mg | ORAL_TABLET | ORAL | Status: DC

## 2012-05-25 NOTE — Progress Notes (Signed)
  Echocardiogram 2D Echocardiogram has been performed.  Sarah Goodman 05/25/2012, 11:43 AM

## 2012-05-25 NOTE — Progress Notes (Signed)
Patient Name: Sarah Goodman Date of Encounter: 05/25/2012    SUBJECTIVE: this is become a difficult situation to deal with. The patient's weight has become an obstruction to evaluation. She continues to have exertional pressure/fullness in the chest. Further dissecting the complaints, she has had chest discomfort with exertion for approximately 2 months. This is a burning or a tight feeling. She continues to have it while ambulating here in the hospital. He goes away with rest. We will unable to walk or on the treadmill because of her weight. MI has been ruled out.  Vertigo has resolved.  TELEMETRY:  Normal sinus rhythm: Filed Vitals:   05/24/12 1905 05/24/12 2055 05/25/12 0339 05/25/12 0800  BP:  134/89 123/76 128/76  Pulse: 93 86 84 102  Temp:  97.9 F (36.6 C) 98.9 F (37.2 C) 98.4 F (36.9 C)  TempSrc:  Oral Axillary Oral  Resp:  28 21 21   Height:      Weight:      SpO2:  99% 99% 98%    Intake/Output Summary (Last 24 hours) at 05/25/12 1001 Last data filed at 05/25/12 0600  Gross per 24 hour  Intake      0 ml  Output   2375 ml  Net  -2375 ml    LABS: Basic Metabolic Panel:  Recent Labs  16/10/96 1504 05/24/12 0130  NA 130* 132*  K 3.8 3.8  CL 93* 94*  CO2 25 24  GLUCOSE 211* 248*  BUN 11 11  CREATININE 0.62 0.67  CALCIUM 9.8 9.6   CBC:  Recent Labs  05/23/12 1504 05/24/12 0130  WBC 8.3 9.0  HGB 12.3 11.7*  HCT 35.4* 33.5*  MCV 81.2 81.3  PLT 354 333   Cardiac Enzymes:  Recent Labs  05/23/12 2027 05/24/12 0130 05/24/12 0801  TROPONINI <0.30 <0.30 <0.30   Hemoglobin A1C:  Recent Labs  05/23/12 2027  HGBA1C 9.3*     Radiology/Studies:  No new data  Physical Exam: Blood pressure 128/76, pulse 102, temperature 98.4 F (36.9 C), temperature source Oral, resp. rate 21, height 5\' 8"  (1.727 m), weight 176.9 kg (389 lb 15.9 oz), last menstrual period 05/18/2012, SpO2 98.00%. Weight change:    One of 6 systolic murmur.  Chest is  clear.  No chest wall tenderness is noted.  ASSESSMENT:  1. Exertional chest discomfort and a markedly obese female with family history of CAD (mother), markedly out of control diabetes, and hyperlipidemia. MI has been ruled out. Still awaiting echo results her LV function.  Plan:  1. I have spoken to the patient concerning our inability to do certain forms of testing due to her marked obesity. Given the risk factors and the consistent nature of her complaint I feel that coronary angiography is probably our best diagnostic modality. I discussed the procedure and the increased risk related to her obesity including stroke, death, myocardial infarction, bleeding, allergy, kidney injury, among others. I discussed performed perfusion scintigraphy and the difficulty we would have with interpretation do to soft tissue attenuation. She will consider her diagnostic options, with my recommendation that we proceed with coronary angiography.  Selinda Eon 05/25/2012, 10:01 AM

## 2012-05-25 NOTE — Progress Notes (Signed)
[facility] Progress Note [place] TEAM 1 - Stepdown/ICU TEAM   Sarah Goodman LKG:[id number] DOB: [birth year] DOA: 05/23/2012 PCP: [name], MD  Brief narrative: 47 year old female patient with known uncontrolled diabetes. Presented to the emergency department with complaints of substernal chest pain primarily on the left. Described as aching dull and tight without radiation. Gradual onset over a few weeks. Patient thinks that the pain may be worse when she is up and active but was nonspecific regarding this. Her primary concern is that she has significant risk factors including a mother with stents to blockage in heart in her 31s and her uncontrolled diabetes. In the emergency department her troponins and EKG were negative. CTA of the chest revealed no evidence of pulmonary embolism. Cardiology also evaluated the patient in the emergency department and recommended continuing medical rule out with serial labs/ecg.  Assessment/Plan:  Chest pain / ? Unstable angina -so far ischemic WU negative: enzymes and EKG have been normal but sx's VERY concerning for ischemic etiology -appreciate Cards assistance-also concerned over sx's and risk factors so has offered diagnostic cath- pt considering  -check ECHO -TG elevated, HDL suboptimal but LDL low- likely need to add statin  Diabetes mellitus type II, uncontrolled -HgbA1c 9 -pt reports poorly controlled diabetes despite med rxn > 108yrs -will resume home OHA's (beginning 4/16 since rec'd IV contrast) -added long acting insulin-increase dose today and adjust once can resume metformin (if proceeds with cath will further delay resumption) -pt confirms on BOTH metformin and the januvamet at home -cont SSI  -diabetes educator following  Dehydration/Vertigo -suspect persistent hyperglycemia has precipitated dehydration -pt describes her sx's as primarily lightheadedness and no spinning sensation so not c/w true vertigo -ensure adequate  hydration and glycemic control  ? UTI (urinary tract infection) -having dysuria -FU UA and culture  -begin empiric Rocephin -also has uric acid crystals but no gout sx's  GERD (gastroesophageal reflux disease) -likely has early gastroparesis causing reflux sx's -monitor and if cardiac eval negative then give 1 month course PPI and follow sx resolution  Morbid obesity with body mass index of 50.0-59.9 in adult -nutrition consultation -pt endorsed 55 lb weight gain over past 6 months  DVT prophylaxis: Subcutaneous heparin Code Status: Full Family Communication: Patient Disposition Plan: Remain in SDU until after cath since concerned for possible UAP Isolation: None  Consultants: Cardiology  Procedures: Cardiac catherization pending Echocardiogram pending  Antibiotics: Rocephin 4/16 >>>  HPI/Subjective: Patient continues to endorse intermittent central chest heaviness even at rest without any other associated symptoms. Sts overall feels much better  Objective: Blood pressure 128/76, pulse 102, temperature 98.4 F (36.9 C), temperature source Oral, resp. rate 21, height 5\' 8"  (1.727 m), weight 176.9 kg (389 lb 15.9 oz), last menstrual period 05/18/2012, SpO2 98.00%.  Intake/Output Summary (Last 24 hours) at 05/25/12 1139 Last data filed at 05/25/12 0600  Gross per 24 hour  Intake      0 ml  Output   2025 ml  Net  -2025 ml   Exam: General: No acute respiratory distress, morbidly obese Lungs: Clear to auscultation bilaterally without wheezes or crackles, RA Cardiovascular: Regular rate and rhythm without murmur gallop or rub normal S1 and S2, trace peripheral nonpitting edema or JVD Abdomen: Nontender, obese yet nondistended, soft, bowel sounds positive, no rebound, no ascites, no appreciable mass Musculoskeletal: No significant cyanosis, clubbing of bilateral lower extremities Neurological: Alert and oriented x 3, moves all extremities x 4 without focal neurological  deficits, CN 2-12 intact  Data Reviewed: Basic Metabolic Panel:  Recent Labs Lab 05/23/12 1504 05/24/12 0130  NA 130* 132*  K 3.8 3.8  CL 93* 94*  CO2 25 24  GLUCOSE 211* 248*  BUN 11 11  CREATININE 0.62 0.67  CALCIUM 9.8 9.6   CBC:  Recent Labs Lab 05/23/12 1504 05/24/12 0130  WBC 8.3 9.0  HGB 12.3 11.7*  HCT 35.4* 33.5*  MCV 81.2 81.3  PLT 354 333   Cardiac Enzymes:  Recent Labs Lab 05/23/12 1836 05/23/12 2027 05/24/12 0130 05/24/12 0801  TROPONINI <0.30 <0.30 <0.30 <0.30   BNP (last 3 results)  Recent Labs  05/23/12 1504  PROBNP 16.1   CBG:  Recent Labs Lab 05/24/12 0806 05/24/12 1149 05/24/12 1620 05/24/12 2129 05/25/12 0849  GLUCAP 288* 232* 197* 208* 301*    Recent Results (from the past 240 hour(s))  MRSA PCR SCREENING     Status: None   Collection Time    05/23/12 11:17 PM      Result Value Range Status   MRSA by PCR NEGATIVE  NEGATIVE Final   Comment:            The GeneXpert MRSA Assay (FDA     approved for NASAL specimens     only), is one component of a     comprehensive MRSA colonization     surveillance program. It is not     intended to diagnose MRSA     infection nor to guide or     monitor treatment for     MRSA infections.     Studies:  Recent x-ray studies have been reviewed in detail by the Attending Physician  Scheduled Meds:  Reviewed in detail by the Attending Physician   Junious Silk, ANP [facility] Office  [phone] Pager [phone]  On-Call/Text Page:      [email or web address]      password [email or web address]  If 7PM-7AM, please contact night-coverage [email or web address] Password [email or web address] 05/25/2012, 11:39 AM   LOS: 2 days   I have personally examined this patient and reviewed the entire database. I have reviewed the above note, made any necessary editorial changes, and agree with its content.  Lonia Blood, MD [facility]

## 2012-05-25 NOTE — Progress Notes (Addendum)
Nurse gave teaching to pt with diabetes kit ordered from pharmacy.  Pt accepted teaching and demonstrated understanding through teach back to nurse with checking blood sugar levels and administering insulin injection.  Nurse will continue to encourage pt to manage condition and utilize available resources.

## 2012-05-26 ENCOUNTER — Encounter (HOSPITAL_COMMUNITY)
Admission: EM | Disposition: A | Discharge status: Home / Self Care | Payer: Self-pay | Source: Home / Self Care | Attending: Internal Medicine

## 2012-05-26 HISTORY — PX: LEFT HEART CATHETERIZATION WITH CORONARY ANGIOGRAM: SHX5451

## 2012-05-26 LAB — CBC
HCT: 34.3 % — ABNORMAL LOW (ref 36.0–46.0)
Hemoglobin: 11.9 g/dL — ABNORMAL LOW (ref 12.0–15.0)
MCH: 28.1 pg (ref 26.0–34.0)
MCHC: 34.7 g/dL (ref 30.0–36.0)
MCV: 81.1 fL (ref 78.0–100.0)
Platelets: 349 10*3/uL (ref 150–400)
RBC: 4.23 MIL/uL (ref 3.87–5.11)
RDW: 15.1 % (ref 11.5–15.5)
WBC: 8.2 10*3/uL (ref 4.0–10.5)

## 2012-05-26 LAB — GLUCOSE, CAPILLARY
Glucose-Capillary: 312 mg/dL — ABNORMAL HIGH (ref 70–99)
Glucose-Capillary: 195 mg/dL — ABNORMAL HIGH (ref 70–99)
Glucose-Capillary: 250 mg/dL — ABNORMAL HIGH (ref 70–99)
Glucose-Capillary: 264 mg/dL — ABNORMAL HIGH (ref 70–99)

## 2012-05-26 LAB — BASIC METABOLIC PANEL
BUN: 12 mg/dL (ref 6–23)
CO2: 22 mEq/L (ref 19–32)
Calcium: 9.5 mg/dL (ref 8.4–10.5)
Chloride: 95 mEq/L — ABNORMAL LOW (ref 96–112)
Creatinine, Ser: 0.7 mg/dL (ref 0.50–1.10)
GFR calc Af Amer: >90 mL/min (ref >90)
GFR calc non Af Amer: >90 mL/min (ref >90)
Glucose, Bld: 362 mg/dL — ABNORMAL HIGH (ref 70–99)
Potassium: 4 mEq/L (ref 3.5–5.1)
Sodium: 128 mEq/L — ABNORMAL LOW (ref 135–145)

## 2012-05-26 LAB — PREGNANCY, URINE: Preg Test, Ur: NEGATIVE

## 2012-05-26 SURGERY — LEFT HEART CATHETERIZATION WITH CORONARY ANGIOGRAM
Anesthesia: LOCAL

## 2012-05-26 MED ORDER — METFORMIN HCL 850 MG PO TABS: 850.0000 mg | ORAL_TABLET | Freq: Two times a day (BID) | ORAL | Status: DC

## 2012-05-26 MED ORDER — "NEEDLE (DISP) 30G X 1/2"" MISC": Status: DC

## 2012-05-26 MED ORDER — HEPARIN (PORCINE) IN NACL 2-0.9 UNIT/ML-% IJ SOLN
INTRAMUSCULAR | Status: AC
  Filled 2012-05-26: qty 1000

## 2012-05-26 MED ORDER — MIDAZOLAM HCL 2 MG/2ML IJ SOLN
INTRAMUSCULAR | Status: AC
  Filled 2012-05-26: qty 2

## 2012-05-26 MED ORDER — LIDOCAINE HCL (PF) 1 % IJ SOLN
INTRAMUSCULAR | Status: AC
  Filled 2012-05-26: qty 30

## 2012-05-26 MED ORDER — VERAPAMIL HCL 2.5 MG/ML IV SOLN
INTRAVENOUS | Status: AC
  Filled 2012-05-26: qty 2

## 2012-05-26 MED ORDER — INSULIN ASPART 100 UNIT/ML ~~LOC~~ SOLN
0.0000 [IU] | Freq: Every day | SUBCUTANEOUS | Status: DC
  Administered 2012-05-26: 3 [IU] via SUBCUTANEOUS

## 2012-05-26 MED ORDER — ONDANSETRON HCL 4 MG/2ML IJ SOLN: 4.0000 mg | Freq: Four times a day (QID) | INTRAMUSCULAR | Status: DC | PRN

## 2012-05-26 MED ORDER — INSULIN GLARGINE 100 UNITS/ML SOLOSTAR PEN: 243.0000 [IU] | PEN_INJECTOR | Freq: Every day | SUBCUTANEOUS | Status: DC

## 2012-05-26 MED ORDER — INSULIN ASPART 100 UNIT/ML ~~LOC~~ SOLN
4.0000 [IU] | Freq: Three times a day (TID) | SUBCUTANEOUS | Status: DC
  Administered 2012-05-26 – 2012-05-27 (×2): 4 [IU] via SUBCUTANEOUS

## 2012-05-26 MED ORDER — HEPARIN SODIUM (PORCINE) 1000 UNIT/ML IJ SOLN
INTRAMUSCULAR | Status: AC
  Filled 2012-05-26: qty 1

## 2012-05-26 MED ORDER — FENTANYL CITRATE 0.05 MG/ML IJ SOLN
INTRAMUSCULAR | Status: AC
  Filled 2012-05-26: qty 2

## 2012-05-26 MED ORDER — ACETAMINOPHEN 325 MG PO TABS: 650.0000 mg | ORAL_TABLET | ORAL | Status: DC | PRN

## 2012-05-26 MED ORDER — SITAGLIPTIN PHOS-METFORMIN HCL 50-500 MG PO TABS: 1.0000 | ORAL_TABLET | Freq: Two times a day (BID) | ORAL | Status: DC

## 2012-05-26 MED ORDER — INSULIN ASPART 100 UNIT/ML ~~LOC~~ SOLN: 0.0000 [IU] | Freq: Three times a day (TID) | SUBCUTANEOUS | Status: DC

## 2012-05-26 MED ORDER — INSULIN GLARGINE 100 UNITS/ML SOLOSTAR PEN: 24.0000 [IU] | PEN_INJECTOR | Freq: Every day | SUBCUTANEOUS | Status: DC

## 2012-05-26 MED ORDER — "INSULIN SYRINGE 29G X 1/2"" 0.3 ML MISC": Status: DC

## 2012-05-26 MED ORDER — INSULIN GLARGINE 100 UNIT/ML ~~LOC~~ SOLN: 24.0000 [IU] | Freq: Every day | SUBCUTANEOUS | Status: DC

## 2012-05-26 MED ORDER — SODIUM CHLORIDE 0.9 % IV SOLN: INTRAVENOUS | Status: AC

## 2012-05-26 NOTE — Progress Notes (Signed)
Patient seen and examined. Agree with prior note. Patient for cardiac cath today. Further recommendations depending on results.  Peggye Pitt, MD [facility] Pager: [phone]

## 2012-05-26 NOTE — Progress Notes (Signed)
Inpatient Diabetes Program Recommendations  AACE/ADA: New Consensus Statement on Inpatient Glycemic Control (2013)  Target Ranges:  Prepandial:   less than 140 mg/dL      Peak postprandial:   less than 180 mg/dL (1-2 hours)      Critically ill patients:  140 - 180 mg/dL   Reason for Visit: Hyperglycemia  Pt s/p cath and getting ready for discharge.  Reviewed insulin pen administration and pt verbalized understanding.  Is familiar with pen from using [employer] in the past.  Requested to be discharged on insulin pen rather than syringe.  Prior to discharge, pt was on Janumet 50/500 bid and when she went to refill prescription, she was given metformin 850 mg bid and tradjenta 5 mg QD.  Pt states she did not take Janumet and metformin at the same time.  Has viewed diabetes videos on pt ed channel and has f/u appt with [name] in 1 month.  Recommended pt call [name] to schedule appt within the next week since she is new to insulin.  Pt is motivated to control blood sugars.  Educated on hypoglycemia s/s and treatment.  Has meter at home and will check blood sugars 3 - 4 times/day and take logbook to MD.  Pt will need:  Prescription for insulin pen (Lantus and Novolog) Pen Needles Strips for glucose meter  Inpatient Diabetes Program Recommendations Insulin - Basal: Increase Lantus to 30 units QAM.  Continue with uptitration Correction (SSI): Consider addition of HS correction insulin Insulin - Meal Coverage: Increase Novolog to 8 units tidwc if pt eats >50% meal HgbA1C: 9.3% on 05/23/2012 - Pt states this is improved from last HgbA1C  Note: Discussed with RN.  Thank you. Ailene Ards, RD, LDN, CDE Inpatient Diabetes Coordinator [phone]

## 2012-05-26 NOTE — H&P (Signed)
The patient was admitted with chest pain that has features of ischemia. Due to marked obesity, ETT and other diagnostic imaging modalities have been difficult to perform and or interpret. Nuclear felt to have hibh likelihood of attenuation artifact preventing interpretation.Given recurring nature of her complaints and risk factors, coronary angiography as recommended to exclude CAD as the cause of her presentation. Echo LVEF is normal. She has ruled out for MI. Vertigo has resolved. Chest pain with activity is still occurring. The procedure and risk of death, stroke, MI, bleeding, allergy, kidney injury, among others discussed in detail and accepted.

## 2012-05-26 NOTE — Progress Notes (Signed)
Pt was transported to cath lab with cath lab nurse.  Pt was alert and oriented prior to transport without questions or concerns

## 2012-05-26 NOTE — Discharge Instructions (Signed)
Coronary Angiography Coronary angiography is an X-ray procedure used to look at the arteries in the heart. In this procedure, a dye is injected through a long, hollow tube (catheter). The catheter is about the size of a piece of cooked spaghetti. The catheter injects a dye into an artery in your groin. X-rays are then taken to show if there is a blockage in the arteries of your heart. BEFORE THE PROCEDURE   Let your caregiver know if you have allergies to shellfish or contrast dye. Also let your caregiver know if you have kidney problems or failure.  Do not eat or drink starting from midnight up to the time of the procedure, or as directed.  You may drink enough water to take your medications the morning of the procedure if you were instructed to do so.  You should be at the hospital or outpatient facility where the procedure is to be done 60 minutes prior to the procedure or as directed. PROCEDURE  You may be given an IV medication to help you relax before the procedure.  You will be prepared for the procedure by washing and shaving the area where the catheter will be inserted. This is usually done in the groin but may be done in the fold of your arm by your elbow.  A medicine will be given to numb your groin where the catheter will be inserted.  A specially trained doctor will insert the catheter into an artery in your groin. The catheter is guided by using a special type of X-ray (fluoroscopy) to the blood vessel being examined.  A special dye is then injected into the catheter and X-rays are taken. The dye helps to show where any narrowing or blockages are located in the heart arteries. AFTER THE PROCEDURE   After the procedure you will be kept in bed lying flat for several hours. You will be instructed to not bend or cross your legs.  The groin insertion site will be watched and checked frequently.  The pulse in your feet will be checked frequently.  Additional blood tests, X-rays  and an EKG may be done.  You may stay in the hospital overnight for observation. SEEK IMMEDIATE MEDICAL CARE IF:   You develop chest pain, shortness of breath, feel faint, or pass out.  There is bleeding, swelling, or drainage from the catheter insertion site.  You develop pain, discoloration, coldness, or severe bruising in the leg or area where the catheter was inserted.  You have a fever. Document Released: 08/02/2002 Document Revised: 04/20/2011 Document Reviewed: 09/21/2007 [facility] Patient Information 2013 [place], [place]. Radial Site Care Refer to this sheet in the next few weeks. These instructions provide you with information on caring for yourself after your procedure. Your caregiver may also give you more specific instructions. Your treatment has been planned according to current medical practices, but problems sometimes occur. Call your caregiver if you have any problems or questions after your procedure. HOME CARE INSTRUCTIONS  You may shower the day after the procedure.Remove the bandage (dressing) and gently wash the site with plain soap and water.Gently pat the site dry.  Do not apply powder or lotion to the site.  Do not submerge the affected site in water for 3 to 5 days.  Inspect the site at least twice daily.  Do not flex or bend the affected arm for 24 hours.  No lifting over 5 pounds (2.3 kg) for 5 days after your procedure.  Do not drive home if you  are discharged the same day of the procedure. Have someone else drive you.  You may drive 24 hours after the procedure unless otherwise instructed by your caregiver.  Do not operate machinery or power tools for 24 hours.  A responsible adult should be with you for the first 24 hours after you arrive home. What to expect:  Any bruising will usually fade within 1 to 2 weeks.  Blood that collects in the tissue (hematoma) may be painful to the touch. It should usually decrease in size and tenderness within 1  to 2 weeks. SEEK IMMEDIATE MEDICAL CARE IF:  You have unusual pain at the radial site.  You have redness, warmth, swelling, or pain at the radial site.  You have drainage (other than a small amount of blood on the dressing).  You have chills.  You have a fever or persistent symptoms for more than 72 hours.  You have a fever and your symptoms suddenly get worse.  Your arm becomes pale, cool, tingly, or numb.  You have heavy bleeding from the site. Hold pressure on the site. Document Released: 02/28/2010 Document Revised: 04/20/2011 Document Reviewed: 02/28/2010 [facility] Patient Information 2013 [place], [place]. Insulin Glargine injection What is this medicine? INSULIN GLARGINE (IN su lin GLAR geen) is a human-made form of insulin. This drug lowers the amount of sugar in your blood. It is a long-acting insulin that is usually given once a day. This medicine may be used for other purposes; ask your health care provider or pharmacist if you have questions. What should I tell my health care provider before I take this medicine? They need to know if you have any of these conditions: -episodes of hypoglycemia -kidney disease -liver disease -an unusual or allergic reaction to insulin, metacresol, other medicines, foods, dyes, or preservatives -pregnant or trying to get pregnant -breast-feeding How should I use this medicine? This medicine is for injection under the skin. Use exactly as directed. It is important to follow the directions given to you by your health care professional or doctor. You will be taught how to use this medicine and how to adjust doses for activities and illness. You may take this medicine at any time of the day but you must take it at the same time everyday. Do not use more insulin than prescribed. Do not use more or less often than prescribed. Always check the appearance of your insulin before using it. This medicine should be clear and colorless like water. Do not  use it if it is cloudy, thickened, colored, or has solid particles in it. Do not mix this medicine with any other insulin or diluent. It is important that you put your used needles and syringes in a special sharps container. Do not put them in a trash can. If you do not have a sharps container, call your pharmacist or healthcare provider to get one. Talk to your pediatrician regarding the use of this medicine in children. Special care may be needed. Overdosage: If you think you have taken too much of this medicine contact a poison control center or emergency room at once. NOTE: This medicine is only for you. Do not share this medicine with others. What if I miss a dose? It is important not to miss a dose. Your health care professional or doctor should discuss a plan for missed doses with you. If you do miss a dose, follow their plan. Do not take double doses. What may interact with this medicine? -other medicines for diabetes  Many medications may cause an increase or decrease in blood sugar, these include: -alcohol containing beverages -aspirin and aspirin-like drugs -chloramphenicol -chromium -diuretics -female hormones, like estrogens or progestins and birth control pills -heart medicines -isoniazid -female hormones or anabolic steroids -medicines for weight loss -medicines for allergies, asthma, cold, or cough -medicines for mental problems -medicines called MAO Inhibitors like Nardil, Parnate, Marplan, Eldepryl -niacin -NSAIDs, medicines for pain and inflammation, like ibuprofen or naproxen -pentamidine -phenytoin -probenecid -quinolone antibiotics like ciprofloxacin, levofloxacin, ofloxacin -some herbal dietary supplements -steroid medicines like prednisone or cortisone -thyroid medicine Some medications can hide the warning symptoms of low blood sugar. You may need to monitor your blood sugar more closely if you are taking one of these medications. These include: -beta-blockers  such as atenolol, metoprolol, propranolol -clonidine -guanethidine -reserpine This list may not describe all possible interactions. Give your health care provider a list of all the medicines, herbs, non-prescription drugs, or dietary supplements you use. Also tell them if you smoke, drink alcohol, or use illegal drugs. Some items may interact with your medicine. What should I watch for while using this medicine? Visit your health care professional or doctor for regular checks on your progress. To control your diabetes you must use this medicine regularly and follow a diet and exercise schedule. Checking and recording your blood sugar and urine ketone levels regularly is important. Use a blood sugar measuring device before you treat high or low blood sugar. Always carry a quick-source of sugar with you in case you have symptoms of low blood sugar. Examples include hard sugar candy or glucose tablets. Make sure family members know that you can choke if you eat or drink when you develop serious symptoms of low blood sugar, such as seizures or unconsciousness. They must get medical help at once. Make sure that you have the right kind of syringe for the type of insulin you use. Try not to change the brand and type of insulin or syringe unless your health care professional or doctor tells you to. Switching insulin brand or type can cause dangerously high or low blood sugar. Always keep an extra supply of insulin, syringes, and needles on hand. Use a syringe one time only. Throw away syringe and needle in a closed container to prevent accidental needle sticks. Insulin pens and cartridges should never be shared. Sharing may result in passing of viruses like hepatitis or HIV. Wear a medical identification bracelet or chain to say you have diabetes, and carry a card that lists all your medications. Many nonprescription cough and cold products contain sugar or alcohol. These can affect diabetes control or can alter  the results of tests used to monitor blood sugar. Avoid alcohol. Avoid products that contain alcohol or sugar. What side effects may I notice from receiving this medicine? Side effects that you should report to your health care professional or doctor as soon as possible: Symptoms of low blood sugar: -You may feel nervous, confused, dizzy, hungry, weak, sweaty, shaky, cold, and irritable. You may also experience headache, blurred vision, rapid heartbeat and loss of consciousness. Symptoms of high blood sugar: -You may experience dizziness, dry mouth, dry skin, fruity breath, loss of appetite, nausea, stomach ache, unusual thirst, frequent urination Insulin also can cause rare but serious allergic reactions in some patients, including: -bad skin rash and itching -breathing problems Side effects that usually do not require medical attention (report to your health care professional or doctor if they continue or are bothersome): -increase or decrease  in fatty tissue under the skin, through overuse of a particular injection -itching, burning, swelling, or rash at the injection site This list may not describe all possible side effects. Call your doctor for medical advice about side effects. You may report side effects to FDA at 1-800-FDA-1088. Where should I keep my medicine? Keep out of the reach of children. Store unopened vials in a refrigerator between 2 and 8 degrees C (36 and 46 degrees F). Do not freeze or use if the insulin has been frozen. Opened vials (vials currently in use) may be stored in the refrigerator or at room temperature, at approximately 25 degrees C (77 degrees F) or cooler. Keeping your insulin at room temperature decreases the amount of pain during injection. Once opened, your insulin can be used for 28 days. After 28 days, the vial should be thrown away. Store unopened pen-injector cartridges in a refrigerator between 2 and 8 degrees C (36 and 46 degrees F.) Do not freeze or use  if the insulin has been frozen. Insulin cartridges inserted into the [employer] system should be kept at room temperature, approximately 25 degrees C (77 degrees F) or cooler. Do not store in the refrigerator. Once inserted into the [employer] system, the insulin can be used for 28 days. After 28 days, the cartridge should be thrown away. Protect from light and excessive heat. Throw away any unused medicine after the expiration date or after the specified time for room temperature storage has passed. NOTE: This sheet is a summary. It may not cover all possible information. If you have questions about this medicine, talk to your doctor, pharmacist, or health care provider.  2012, [employer]. (04/29/2007 11:47:33 AM)Diabetes Meal Planning Guide The diabetes meal planning guide is a tool to help you plan your meals and snacks. It is important for people with diabetes to manage their blood glucose (sugar) levels. Choosing the right foods and the right amounts throughout your day will help control your blood glucose. Eating right can even help you improve your blood pressure and reach or maintain a healthy weight. CARBOHYDRATE COUNTING MADE EASY When you eat carbohydrates, they turn to sugar. This raises your blood glucose level. Counting carbohydrates can help you control this level so you feel better. When you plan your meals by counting carbohydrates, you can have more flexibility in what you eat and balance your medicine with your food intake. Carbohydrate counting simply means adding up the total amount of carbohydrate grams in your meals and snacks. Try to eat about the same amount at each meal. Foods with carbohydrates are listed below. Each portion below is 1 carbohydrate serving or 15 grams of carbohydrates. Ask your dietician how many grams of carbohydrates you should eat at each meal or snack. Grains and Starches  1 slice bread.   English muffin or hotdog/hamburger bun.   cup cold  cereal (unsweetened).   cup cooked pasta or rice.   cup starchy vegetables (corn, potatoes, peas, beans, winter squash).  1 tortilla (6 inches).   bagel.  1 waffle or pancake (size of a CD).   cup cooked cereal.  4 to 6 small crackers. *Whole grain is recommended. Fruit  1 cup fresh unsweetened berries, melon, papaya, pineapple.  1 small fresh fruit.   banana or mango.   cup fruit juice (4 oz unsweetened).   cup canned fruit in natural juice or water.  2 tbs dried fruit.  12 to 15 grapes or cherries. Milk and Yogurt  1 cup  fat-free or 1% milk.  1 cup soy milk.  6 oz light yogurt with sugar-free sweetener.  6 oz low-fat soy yogurt.  6 oz plain yogurt. Vegetables  1 cup raw or  cup cooked is counted as 0 carbohydrates or a "free" food.  If you eat 3 or more servings at 1 meal, count them as 1 carbohydrate serving. Other Carbohydrates   oz chips or pretzels.   cup ice cream or frozen yogurt.   cup sherbet or sorbet.  2 inch square cake, no frosting.  1 tbs honey, sugar, jam, jelly, or syrup.  2 small cookies.  3 squares of graham crackers.  3 cups popcorn.  6 crackers.  1 cup broth-based soup.  Count 1 cup casserole or other mixed foods as 2 carbohydrate servings.  Foods with less than 20 calories in a serving may be counted as 0 carbohydrates or a "free" food. You may want to purchase a book or computer software that lists the carbohydrate gram counts of different foods. In addition, the nutrition facts panel on the labels of the foods you eat are a good source of this information. The label will tell you how big the serving size is and the total number of carbohydrate grams you will be eating per serving. Divide this number by 15 to obtain the number of carbohydrate servings in a portion. Remember, 1 carbohydrate serving equals 15 grams of carbohydrate. SERVING SIZES Measuring foods and serving sizes helps you make sure you are  getting the right amount of food. The list below tells how big or small some common serving sizes are.  1 oz.........4 stacked dice.  3 oz........Marland KitchenDeck of cards.  1 tsp.......Marland KitchenTip of little finger.  1 tbs......Marland KitchenMarland KitchenThumb.  2 tbs.......Marland KitchenGolf ball.   cup......Marland KitchenHalf of a fist.  1 cup.......Marland KitchenA fist. SAMPLE DIABETES MEAL PLAN Below is a sample meal plan that includes foods from the grain and starches, dairy, vegetable, fruit, and meat groups. A dietician can individualize a meal plan to fit your calorie needs and tell you the number of servings needed from each food group. However, controlling the total amount of carbohydrates in your meal or snack is more important than making sure you include all of the food groups at every meal. You may interchange carbohydrate containing foods (dairy, starches, and fruits). The meal plan below is an example of a 2000 calorie diet using carbohydrate counting. This meal plan has 17 carbohydrate servings. Breakfast  1 cup oatmeal (2 carb servings).   cup light yogurt (1 carb serving).  1 cup blueberries (1 carb serving).   cup almonds. Snack  1 large apple (2 carb servings).  1 low-fat string cheese stick. Lunch  Chicken breast salad.  1 cup spinach.   cup chopped tomatoes.  2 oz chicken breast, sliced.  2 tbs low-fat [place] dressing.  12 whole-wheat crackers (2 carb servings).  12 to 15 grapes (1 carb serving).  1 cup low-fat milk (1 carb serving). Snack  1 cup carrots.   cup hummus (1 carb serving). Dinner  3 oz broiled salmon.  1 cup brown rice (3 carb servings). Snack  1  cups steamed broccoli (1 carb serving) drizzled with 1 tsp olive oil and lemon juice.  1 cup light pudding (2 carb servings). DIABETES MEAL PLANNING WORKSHEET Your dietician can use this worksheet to help you decide how many servings of foods and what types of foods are right for you.  BREAKFAST Food Group and Servings / Carb  Servings Grain/Starches  __________________________________ Dairy __________________________________________ Vegetable ______________________________________ Fruit ___________________________________________ Meat __________________________________________ Fat ____________________________________________ LUNCH Food Group and Servings / Carb Servings Grain/Starches ___________________________________ Dairy ___________________________________________ Fruit ____________________________________________ Meat ___________________________________________ Fat _____________________________________________ Laural [place] Food Group and Servings / Carb Servings Grain/Starches ___________________________________ Dairy ___________________________________________ Fruit ____________________________________________ Meat ___________________________________________ Fat _____________________________________________ SNACKS Food Group and Servings / Carb Servings Grain/Starches ___________________________________ Dairy ___________________________________________ Vegetable _______________________________________ Fruit ____________________________________________ Meat ___________________________________________ Fat _____________________________________________ DAILY TOTALS Starches _________________________ Vegetable ________________________ Fruit ____________________________ Dairy ____________________________ Meat ____________________________ Fat ______________________________ Document Released: 10/23/2004 Document Revised: 04/20/2011 Document Reviewed: 09/03/2008 [employer] Patient Information 2013 [place], [place]. Insulin Glargine injection What is this medicine? INSULIN GLARGINE (IN su lin GLAR geen) is a human-made form of insulin. This drug lowers the amount of sugar in your blood. It is a long-acting insulin that is usually given once a day. This medicine may be used for other purposes; ask your health  care provider or pharmacist if you have questions. What should I tell my health care provider before I take this medicine? They need to know if you have any of these conditions: -episodes of hypoglycemia -kidney disease -liver disease -an unusual or allergic reaction to insulin, metacresol, other medicines, foods, dyes, or preservatives -pregnant or trying to get pregnant -breast-feeding How should I use this medicine? This medicine is for injection under the skin. Use exactly as directed. It is important to follow the directions given to you by your health care professional or doctor. You will be taught how to use this medicine and how to adjust doses for activities and illness. You may take this medicine at any time of the day but you must take it at the same time everyday. Do not use more insulin than prescribed. Do not use more or less often than prescribed. Always check the appearance of your insulin before using it. This medicine should be clear and colorless like water. Do not use it if it is cloudy, thickened, colored, or has solid particles in it. Do not mix this medicine with any other insulin or diluent. It is important that you put your used needles and syringes in a special sharps container. Do not put them in a trash can. If you do not have a sharps container, call your pharmacist or healthcare provider to get one. Talk to your pediatrician regarding the use of this medicine in children. Special care may be needed. Overdosage: If you think you have taken too much of this medicine contact a poison control center or emergency room at once. NOTE: This medicine is only for you. Do not share this medicine with others. What if I miss a dose? It is important not to miss a dose. Your health care professional or doctor should discuss a plan for missed doses with you. If you do miss a dose, follow their plan. Do not take double doses. What may interact with this medicine? -other medicines for  diabetes Many medications may cause an increase or decrease in blood sugar, these include: -alcohol containing beverages -aspirin and aspirin-like drugs -chloramphenicol -chromium -diuretics -female hormones, like estrogens or progestins and birth control pills -heart medicines -isoniazid -female hormones or anabolic steroids -medicines for weight loss -medicines for allergies, asthma, cold, or cough -medicines for mental problems -medicines called MAO Inhibitors like Nardil, Parnate, Marplan, Eldepryl -niacin -NSAIDs, medicines for pain and inflammation, like ibuprofen or naproxen -pentamidine -phenytoin -probenecid -quinolone antibiotics like ciprofloxacin, levofloxacin, ofloxacin -some herbal dietary supplements -steroid medicines like prednisone or cortisone -thyroid medicine Some medications can hide the warning symptoms of low  blood sugar. You may need to monitor your blood sugar more closely if you are taking one of these medications. These include: -beta-blockers such as atenolol, metoprolol, propranolol -clonidine -guanethidine -reserpine This list may not describe all possible interactions. Give your health care provider a list of all the medicines, herbs, non-prescription drugs, or dietary supplements you use. Also tell them if you smoke, drink alcohol, or use illegal drugs. Some items may interact with your medicine. What should I watch for while using this medicine? Visit your health care professional or doctor for regular checks on your progress. To control your diabetes you must use this medicine regularly and follow a diet and exercise schedule. Checking and recording your blood sugar and urine ketone levels regularly is important. Use a blood sugar measuring device before you treat high or low blood sugar. Always carry a quick-source of sugar with you in case you have symptoms of low blood sugar. Examples include hard sugar candy or glucose tablets. Make sure family  members know that you can choke if you eat or drink when you develop serious symptoms of low blood sugar, such as seizures or unconsciousness. They must get medical help at once. Make sure that you have the right kind of syringe for the type of insulin you use. Try not to change the brand and type of insulin or syringe unless your health care professional or doctor tells you to. Switching insulin brand or type can cause dangerously high or low blood sugar. Always keep an extra supply of insulin, syringes, and needles on hand. Use a syringe one time only. Throw away syringe and needle in a closed container to prevent accidental needle sticks. Insulin pens and cartridges should never be shared. Sharing may result in passing of viruses like hepatitis or HIV. Wear a medical identification bracelet or chain to say you have diabetes, and carry a card that lists all your medications. Many nonprescription cough and cold products contain sugar or alcohol. These can affect diabetes control or can alter the results of tests used to monitor blood sugar. Avoid alcohol. Avoid products that contain alcohol or sugar. What side effects may I notice from receiving this medicine? Side effects that you should report to your health care professional or doctor as soon as possible: Symptoms of low blood sugar: -You may feel nervous, confused, dizzy, hungry, weak, sweaty, shaky, cold, and irritable. You may also experience headache, blurred vision, rapid heartbeat and loss of consciousness. Symptoms of high blood sugar: -You may experience dizziness, dry mouth, dry skin, fruity breath, loss of appetite, nausea, stomach ache, unusual thirst, frequent urination Insulin also can cause rare but serious allergic reactions in some patients, including: -bad skin rash and itching -breathing problems Side effects that usually do not require medical attention (report to your health care professional or doctor if they continue or are  bothersome): -increase or decrease in fatty tissue under the skin, through overuse of a particular injection -itching, burning, swelling, or rash at the injection site This list may not describe all possible side effects. Call your doctor for medical advice about side effects. You may report side effects to FDA at 1-800-FDA-1088. Where should I keep my medicine? Keep out of the reach of children. Store unopened vials in a refrigerator between 2 and 8 degrees C (36 and 46 degrees F). Do not freeze or use if the insulin has been frozen. Opened vials (vials currently in use) may be stored in the refrigerator or at room temperature, at  approximately 25 degrees C (77 degrees F) or cooler. Keeping your insulin at room temperature decreases the amount of pain during injection. Once opened, your insulin can be used for 28 days. After 28 days, the vial should be thrown away. Store unopened pen-injector cartridges in a refrigerator between 2 and 8 degrees C (36 and 46 degrees F.) Do not freeze or use if the insulin has been frozen. Insulin cartridges inserted into the [employer] system should be kept at room temperature, approximately 25 degrees C (77 degrees F) or cooler. Do not store in the refrigerator. Once inserted into the [employer] system, the insulin can be used for 28 days. After 28 days, the cartridge should be thrown away. Protect from light and excessive heat. Throw away any unused medicine after the expiration date or after the specified time for room temperature storage has passed. NOTE: This sheet is a summary. It may not cover all possible information. If you have questions about this medicine, talk to your doctor, pharmacist, or health care provider.  2013, [employer] Standard. (04/29/2007 11:47:33 AM)

## 2012-05-26 NOTE — Progress Notes (Signed)
TR BAND REMOVAL  LOCATION:    right radial  DEFLATED PER PROTOCOL:    yes  TIME BAND OFF / DRESSING APPLIED:    1400   SITE UPON ARRIVAL:    Level 0  SITE AFTER BAND REMOVAL:    Level 0  REVERSE ALLEN'S TEST:     positive  CIRCULATION SENSATION AND MOVEMENT:    Within Normal Limits   yes  COMMENTS:   Tolerated procedure well 

## 2012-05-26 NOTE — Discharge Summary (Addendum)
Physician Discharge Summary  Sarah Goodman GNF:[id number] DOB: [birth year] DOA: 05/23/2012  PCP: [name], MD  Admit date: 05/23/2012 Discharge date: 05/26/2012  Time spent: 30 minutes  Recommendations for Outpatient Follow-up:  1. Return to [name] in 1-2 weeks for follow up regarding inititiation of insulin during this hospital admission. We are continuing the prior metformin and Januvamet at previous dosages  Discharge Diagnoses:  Active Problems:   Chest discomfort   Diabetes mellitus type II, uncontrolled   Dehydration   Vertigo   UTI (urinary tract infection)   GERD (gastroesophageal reflux disease)   Morbid obesity with body mass index of 50.0-59.9 in adult   Discharge Condition: stable  Diet recommendation: Carbohydrate modified  Filed Weights   05/23/12 2312 05/26/12 0700  Weight: 176.9 kg (389 lb 15.9 oz) 174 kg (383 lb 9.6 oz)    History of present illness:  47 year old female patient with known uncontrolled diabetes. Presented to the emergency department with complaints of substernal chest pain primarily on the left. Described as aching dull and tight without radiation. Gradual onset over a few weeks. Patient thinks that the pain may be worse when she is up and active but was nonspecific regarding this. Her primary concern is that she has significant risk factors including a mother with stents to blockage in heart in her 18s and her uncontrolled diabetes. In the emergency department her troponins and EKG were negative. CTA of the chest revealed no evidence of pulmonary embolism. Cardiology also evaluated the patient in the emergency department and recommended continuing medical rule out with serial labs/ecg.   Hospital Course:   Chest pain / ? Unstable angina Patient presented with symptoms consistent with possible unstable angina. Her enzymes and EKG were negative for acute MI. She did have significant personal risk factors including diabetes and positive  family medical history her cardiac disease premature onset. Echocardiogram was normal with preserved LV function. Because of her morbid obesity she was not an appropriate candidate for noninvasive cardiac testing such as stress testing therefore Dr. Katrinka Blazing of cardiology recommended diagnostic cath. She underwent cardiac catheterization on 05/26/2012 that showed no evidence of coronary disease. In regards to other cardiac risk factor stratification on her lipid panel her triglycerides were moderately elevated, her HDL is suboptimal but her LDL was low.   ** Discharge delayed x 24 hours due to post cath tachycardia that resolved spontaneously.  Diabetes mellitus type II, uncontrolled She has endorsed poorly controlled diabetes despite medical therapy for at least 2 years. This is associated with continued weight gain. Hemoglobin A1c this admission was 9. We have added Lantus insulin this admission and planned to continue her Januvia and metformin after discharge. During the hospitalization these medications were placed on hold because she received IV contrast for CT scan and also received IV contrast for catheterization therefore must wait an additional 48 hours before resuming these medications. With sliding scale insulin and Lantus insulin alone her CBGs have still remained consistently greater than 250. It is recommended she followup with her primary care physician in one to 2 weeks after discharge to ensure no other titration or down titration of her insulin as required. She was instructed in how to administer insulin and she endorsed on date of discharge she felt comfortable with this process.  Dehydration/Vertigo Patient presented with dizziness in the setting of persistent hyperglycemia that was initially labeled vertigo. Patient denied any type of spinning sensation and described her symptoms as primarily lightheadedness. We suspect persistent hyperglycemia with  dehydration that led to these symptoms.  The symptoms have improved with adequate hydration and glycemic control.  GERD (gastroesophageal reflux disease) It is suspected that some of the patient's chest discomfort is result of GERD precipitated by early gastroparesis from poorly controlled diabetes. Since her cardiac evaluation was negative we have recommended she continue her Prilosec and if symptoms persist she may benefit from GI evaluation  Morbid obesity with body mass index of 50.0-59.9 in adult Patient endorsed about a 55 pound weight gain over the past 6 months. Nutrition consultation was obtained and dietary counseling and information provided to the patient   Procedures:  Normal cardiac catheterization by Dr. Verdis Prime on 05/26/2012-radial access  2-D echocardiogram: Study Conclusions Left ventricle: The cavity size was normal. There was mild focal basal hypertrophy of the septum. Systolic function was normal. The estimated ejection fraction was in the range of 60% to 65%. Although no diagnostic regional wall motion abnormality was identified, this possibility cannot be completely excluded on the basis of this study.   Consultations:  Dr. Verdis Prime with the [facility] cardiology  Discharge Exam: Filed Vitals:   05/26/12 1215 05/26/12 1245 05/26/12 1300 05/26/12 1330  BP: 120/83 106/80 117/87 107/66  Pulse: 93 95 102 105  Temp: 98.1 F (36.7 C)     TempSrc: Oral     Resp: 21     Height:      Weight:      SpO2: 96% 98% 98% 98%   Exam:  General: No acute respiratory distress, morbidly obese Lungs: Clear to auscultation bilaterally without wheezes or crackles, RA  Cardiovascular: Regular rate and rhythm without murmur gallop or rub normal S1 and S2, trace peripheral nonpitting edema or JVD  Abdomen: Nontender, obese yet nondistended, soft, bowel sounds positive, no rebound, no ascites, no appreciable mass  Musculoskeletal: No significant cyanosis, clubbing of bilateral lower extremities  Neurological:  Alert and oriented x 3, moves all extremities x 4 without focal neurological deficits, CN 2-12 intact    Discharge Instructions      Discharge Orders   Future Appointments Provider Department Dept Phone   06/17/2012 10:00 AM Carrolyn Leigh, RD [facility] [phone]   06/20/2012 10:40 AM [facility] [place]  [facility] [phone]   Patient should wear two piece clothing and wear no powder or deodorant. Patient should arrive 15 minutes early.   Future Orders Complete By Expires     Call MD for:  extreme fatigue  As directed     Call MD for:  persistant dizziness or light-headedness  As directed     Call MD for:  persistant nausea and vomiting  As directed     Diet Carb Modified  As directed     Discharge instructions  As directed     Comments:      Please check and record your blood glucose readings 30 minutes before meals and at bedtime. Bring this notebook to ALL doctor visits    Increase activity slowly  As directed         Medication List    TAKE these medications       albuterol 108 (90 BASE) MCG/ACT inhaler  Commonly known as:  PROVENTIL HFA;VENTOLIN HFA  Inhale 2 puffs into the lungs 3 (three) times daily as needed for wheezing or shortness of breath.     aspirin EC 81 MG tablet  Take 81 mg by mouth daily.     ibuprofen 800  MG tablet  Commonly known as:  ADVIL,MOTRIN  Take 1 tablet (800 mg total) by mouth 3 (three) times daily.     insulin glargine 100 UNIT/ML injection  Commonly known as:  LANTUS  Inject 0.24 mLs (24 Units total) into the skin daily.     **CHANGED TO FLEX PEN FOR INSULIN WITH SCRIPT FOR NEEDLES       metFORMIN 850 MG tablet  Commonly known as:  GLUCOPHAGE  Take 1 tablet (850 mg total) by mouth 2 (two) times daily with a meal.  Start taking on:  05/29/2012     multivitamin with minerals Tabs  Take 1 tablet by mouth daily.     omeprazole 20 MG capsule  Commonly known as:  PRILOSEC   Take 20 mg by mouth 2 (two) times daily.     sitaGLIPtan-metformin 50-500 MG per tablet  Commonly known as:  JANUMET  Take 1 tablet by mouth 2 (two) times daily with a meal.  Start taking on:  05/29/2012     topiramate 50 MG tablet  Commonly known as:  TOPAMAX  Take 200 mg by mouth at bedtime.       Follow-up Information   Follow up with [name], MD. Schedule an appointment as soon as possible for a visit in 2 weeks.   Contact information:   [address] [place] [address] [phone]        The results of significant diagnostics from this hospitalization (including imaging, microbiology, ancillary and laboratory) are listed below for reference.    Significant Diagnostic Studies: Dg Chest 2 View  05/23/2012  *RADIOLOGY REPORT*  Clinical Data: Weakness and dizziness.  Shortness of breath. Hypertension and diabetes.  CHEST - 2 VIEW  Comparison: 01/28/2010  Findings: Heart size is normal.  Mediastinal shadows are normal. Lungs are clear.  No effusions.  Vascularity is normal.  No significant bony finding.  IMPRESSION: No active disease   Original Report Authenticated By: Paulina Fusi, M.D.    Ct Angio Chest W/cm &/or Wo Cm  05/23/2012  *RADIOLOGY REPORT*  Clinical Data: Chest pain and shortness of breath.  CT ANGIOGRAPHY CHEST  Technique:  Multidetector CT imaging of the chest using the standard protocol during bolus administration of intravenous contrast. Multiplanar reconstructed images including MIPs were obtained and reviewed to evaluate the vascular anatomy.  Contrast: 60mL OMNIPAQUE IOHEXOL 350 MG/ML SOLN  Comparison: None.  Findings: The chest wall is unremarkable.  No breast masses, supraclavicular or axillary lymphadenopathy.  The bony thorax is intact.  The heart is normal in size.  No pericardial effusion.  No mediastinal or hilar lymphadenopathy.  There are scattered lymph nodes.  The esophagus is grossly normal.  The aorta is normal in caliber.  No  dissection.  The pulmonary arterial tree is well opacified.  No filling defects to suggest pulmonary emboli.  Examination of the lung parenchyma demonstrates a patchy ill- defined perihilar/right upper lobe and right lower lobe airspace opacity which is most likely developing bronchopneumonia.  No worrisome mass lesions or nodules.  No pleural effusion.  IMPRESSION:  1.  No CT findings for pulmonary embolism. 2.  Normal thoracic aorta. 3.  Scattered mediastinal and hilar lymph nodes. 4.  Patchy right upper lobe and right lower lobe infiltrates.   Original Report Authenticated By: Rudie Meyer, M.D.     Microbiology: Recent Results (from the past 240 hour(s))  MRSA PCR SCREENING     Status: None   Collection Time  05/23/12 11:17 PM      Result Value Range Status   MRSA by PCR NEGATIVE  NEGATIVE Final   Comment:            The GeneXpert MRSA Assay (FDA     approved for NASAL specimens     only), is one component of a     comprehensive MRSA colonization     surveillance program. It is not     intended to diagnose MRSA     infection nor to guide or     monitor treatment for     MRSA infections.  URINE CULTURE     Status: None   Collection Time    05/24/12 10:37 PM      Result Value Range Status   Specimen Description URINE, CLEAN CATCH   Final   Special Requests Normal   Final   Culture  Setup Time 05/24/2012 23:07   Final   Colony Count 8,000 COLONIES/ML   Final   Culture INSIGNIFICANT GROWTH   Final   Report Status 05/25/2012 FINAL   Final     Labs: Basic Metabolic Panel:  Recent Labs Lab 05/23/12 1504 05/24/12 0130 05/26/12 0510  NA 130* 132* 128*  K 3.8 3.8 4.0  CL 93* 94* 95*  CO2 25 24 22   GLUCOSE 211* 248* 362*  BUN 11 11 12   CREATININE 0.62 0.67 0.70  CALCIUM 9.8 9.6 9.5   CBC:  Recent Labs Lab 05/23/12 1504 05/24/12 0130 05/26/12 0510  WBC 8.3 9.0 8.2  HGB 12.3 11.7* 11.9*  HCT 35.4* 33.5* 34.3*  MCV 81.2 81.3 81.1  PLT 354 333 349   Cardiac  Enzymes:  Recent Labs Lab 05/23/12 1836 05/23/12 2027 05/24/12 0130 05/24/12 0801  TROPONINI <0.30 <0.30 <0.30 <0.30   BNP: BNP (last 3 results)  Recent Labs  05/23/12 1504  PROBNP 16.1   CBG:  Recent Labs Lab 05/25/12 1140 05/25/12 1627 05/25/12 2115 05/26/12 0728 05/26/12 1224  GLUCAP 244* 250* 256* 312* 195*   Signed:  ELLIS,ALLISON L.ANP  [facility] 05/26/2012, 3:02 PM  I have personally examined this patient and reviewed the entire database. I have reviewed the above note, made any necessary editorial changes, and agree with its content.  Lonia Blood, MD [facility]

## 2012-05-26 NOTE — CV Procedure (Signed)
     Diagnostic Cardiac Catheterization Report  Sarah Goodman  47 y.o.  female 08-04-1965  Procedure Date: 05/26/2012 Referring Physician: Renaye Rakers, M.D. Primary Cardiologist:: HWB Leia Alf, M.D.   PROCEDURE:  Left heart catheterization with selective coronary angiography, left ventriculogram.  INDICATIONS:  Exertional chest pain compatible with angina in a patient with multiple risk factors for CAD including marked obesity, hyperlipidemia, diabetes, and family history.  The risks, benefits, and details of the procedure were explained to the patient.  The patient verbalized understanding and wanted to proceed.  Informed written consent was obtained.  PROCEDURE TECHNIQUE:  After Xylocaine anesthesia a 5 French sheath was placed in the right radial artery with a single anterior needle wall stick.   Coronary angiography was done using a 5 French A2 MP and JR 4 catheters.  Left ventriculography was done using a A2 MP catheter hand injection.    CONTRAST:  Total of less than 80 cc.  COMPLICATIONS:  None.    HEMODYNAMICS:  Aortic pressure was 111 over 62 mmHg; LV pressure was 117/11 mmHg; LVEDP 15 mm mercury.  There was no gradient between the left ventricle and aorta.    ANGIOGRAPHIC DATA:   The left main coronary artery is normal.  The left anterior descending artery is dominant, giving origin to 2 large diagonals. The LAD wraps around the left ventricular apex..  The left circumflex artery is normal, given origin to 2 small obtuse marginals..  The right coronary artery is dominant and free of any significant obstruction.  LEFT VENTRICULOGRAM:  Left ventricular angiogram was done in the 30 RAO projection and revealed normal left ventricular wall motion and systolic function with an estimated ejection fraction of 65 %.   IMPRESSIONS:  1. Chest pain, felt to be noncardiac.  2. Widely patent/normal left main, LAD, circumflex, and right coronary arteries.  3. Hyperdynamic  left ventricular systolic function with EF of 65%   RECOMMENDATION:  If no post procedure complications may discharged later today.Marland Kitchen

## 2012-05-26 NOTE — Progress Notes (Signed)
[facility] Progress Note [place] TEAM 1 - Stepdown/ICU TEAM   Sarah Goodman PIR:[id number] DOB: [birth year] DOA: 05/23/2012 PCP: [name], MD  Brief narrative: 47 year old female patient with known uncontrolled diabetes. Presented to the emergency department with complaints of substernal chest pain primarily on the left. Described as aching dull and tight without radiation. Gradual onset over a few weeks. Patient thinks that the pain may be worse when she is up and active but was nonspecific regarding this. Her primary concern is that she has significant risk factors including a mother with stents to blockage in heart in her 22s and her uncontrolled diabetes. In the emergency department her troponins and EKG were negative. CTA of the chest revealed no evidence of pulmonary embolism. Cardiology also evaluated the patient in the emergency department and recommended continuing medical rule out with serial labs/ecg.  Assessment/Plan:  Chest pain / ? Unstable angina -so far ischemic WU negative: enzymes and EKG have been normal but sx's VERY concerning for ischemic etiology -appreciate Cards assistance-also concerned over sx's and risk factors so diagnostic cath for 4/17 -ECHO showed normal LVEF -TG elevated, HDL suboptimal but LDL low- likely need to add statin  Diabetes mellitus type II, uncontrolled -HgbA1c 9 -pt reports poorly controlled diabetes despite med rxn > 62yrs -Cont Januvia- Metformin on hold due to IV contrast at admit and for cath -Lantus started this admit- pt endorses has given self injections since admit and comfortable performing -pt confirms on BOTH metformin and the januvamet at home -cont SSI  -diabetes educator following  Dehydration/Vertigo -suspect persistent hyperglycemia has precipitated dehydration -pt describes her sx's as primarily lightheadedness and no spinning sensation so not c/w true vertigo -ensure adequate hydration and glycemic  control  ? UTI (urinary tract infection) -having dysuria -Urine culture negative so dc Rocephin -also has uric acid crystals but no gout sx's  GERD (gastroesophageal reflux disease) -likely has early gastroparesis causing reflux sx's -monitor and if cardiac eval negative then give 1 month course PPI and follow sx resolution  Morbid obesity with body mass index of 50.0-59.9 in adult -nutrition consultation -pt endorsed 55 lb weight gain over past 6 months  DVT prophylaxis: Subcutaneous heparin Code Status: Full Family Communication: Patient Disposition Plan: Remain in SDU until after cath since concerned for possible UAP Isolation: None  Consultants: Cardiology  Procedures: Cardiac catherization pending Echocardiogram pending  Antibiotics: Rocephin 4/16 >>> 4/17  HPI/Subjective: Patient continues to endorse intermittent central chest heaviness. No SOB. Giving self insulin.  Objective: Blood pressure 112/65, pulse 97, temperature 97.9 F (36.6 C), temperature source Oral, resp. rate 20, height 5\' 8"  (1.727 m), weight 174 kg (383 lb 9.6 oz), last menstrual period 05/18/2012, SpO2 99.00%.  Intake/Output Summary (Last 24 hours) at 05/26/12 1033 Last data filed at 05/26/12 1000  Gross per 24 hour  Intake 817.09 ml  Output    351 ml  Net 466.09 ml   Exam: General: No acute respiratory distress, morbidly obese-OOB to chair Lungs: Clear to auscultation bilaterally without wheezes or crackles, RA Cardiovascular: Regular rate and rhythm without murmur gallop or rub normal S1 and S2, trace peripheral nonpitting edema or JVD Abdomen: Nontender, obese yet nondistended, soft, bowel sounds positive, no rebound, no ascites, no appreciable mass Musculoskeletal: No significant cyanosis, clubbing of bilateral lower extremities Neurological: Alert and oriented x 3, moves all extremities x 4 without focal neurological deficits, CN 2-12 intact  Data Reviewed: Basic Metabolic  Panel:  Recent Labs Lab 05/23/12 1504 05/24/12 0130  05/26/12 0510  NA 130* 132* 128*  K 3.8 3.8 4.0  CL 93* 94* 95*  CO2 25 24 22   GLUCOSE 211* 248* 362*  BUN 11 11 12   CREATININE 0.62 0.67 0.70  CALCIUM 9.8 9.6 9.5   CBC:  Recent Labs Lab 05/23/12 1504 05/24/12 0130 05/26/12 0510  WBC 8.3 9.0 8.2  HGB 12.3 11.7* 11.9*  HCT 35.4* 33.5* 34.3*  MCV 81.2 81.3 81.1  PLT 354 333 349   Cardiac Enzymes:  Recent Labs Lab 05/23/12 1836 05/23/12 2027 05/24/12 0130 05/24/12 0801  TROPONINI <0.30 <0.30 <0.30 <0.30   BNP (last 3 results)  Recent Labs  05/23/12 1504  PROBNP 16.1   CBG:  Recent Labs Lab 05/25/12 0849 05/25/12 1140 05/25/12 1627 05/25/12 2115 05/26/12 0728  GLUCAP 301* 244* 250* 256* 312*    Recent Results (from the past 240 hour(s))  MRSA PCR SCREENING     Status: None   Collection Time    05/23/12 11:17 PM      Result Value Range Status   MRSA by PCR NEGATIVE  NEGATIVE Final   Comment:            The GeneXpert MRSA Assay (FDA     approved for NASAL specimens     only), is one component of a     comprehensive MRSA colonization     surveillance program. It is not     intended to diagnose MRSA     infection nor to guide or     monitor treatment for     MRSA infections.  URINE CULTURE     Status: None   Collection Time    05/24/12 10:37 PM      Result Value Range Status   Specimen Description URINE, CLEAN CATCH   Final   Special Requests Normal   Final   Culture  Setup Time 05/24/2012 23:07   Final   Colony Count 8,000 COLONIES/ML   Final   Culture INSIGNIFICANT GROWTH   Final   Report Status 05/25/2012 FINAL   Final     Studies:  Recent x-ray studies have been reviewed in detail by the Attending Physician  Scheduled Meds:  Reviewed in detail by the Attending Physician   Junious Silk, ANP [facility] Office  [phone] Pager [phone]  On-Call/Text Page:      [email or web address]      password [email or web address]  If 7PM-7AM,  please contact night-coverage [email or web address] Password [email or web address] 05/26/2012, 10:33 AM   LOS: 3 days

## 2012-05-27 LAB — GLUCOSE, CAPILLARY: Glucose-Capillary: 327 mg/dL — ABNORMAL HIGH (ref 70–99)

## 2012-05-27 NOTE — Progress Notes (Signed)
Cath site and wrist clean and dry.  OK for d/c from cardiac followup.  Darden Palmer MD [facility]

## 2012-05-27 NOTE — Progress Notes (Signed)
Have reviewed pt history and present symptoms on admission.  Would lap-band or gastric bypass surgery be a potential consideration for this patient. Can understand that patient is gaining weight with hyperglycemia in light of continuous hunger. Now with addition of Lantus on home regimen, pt may tend to be even hungrier and gain more weight. Not sure if gastric bypass has been considered?  Thank you, Lenor Coffin, RN, CNS, Diabetes Coordinator [phone])

## 2012-06-02 ENCOUNTER — Ambulatory Visit: Payer: Medicaid Other | Admitting: *Deleted

## 2012-06-17 ENCOUNTER — Encounter: Payer: Medicaid Other | Attending: Family Medicine | Admitting: *Deleted

## 2012-06-17 VITALS — Ht 68.0 in | Wt 384.2 lb

## 2012-06-17 DIAGNOSIS — Z713 Dietary counseling and surveillance: Secondary | ICD-10-CM | POA: Insufficient documentation

## 2012-06-17 DIAGNOSIS — E1165 Type 2 diabetes mellitus with hyperglycemia: Secondary | ICD-10-CM

## 2012-06-17 DIAGNOSIS — IMO0002 Reserved for concepts with insufficient information to code with codable children: Secondary | ICD-10-CM

## 2012-06-17 DIAGNOSIS — E119 Type 2 diabetes mellitus without complications: Secondary | ICD-10-CM | POA: Insufficient documentation

## 2012-06-17 NOTE — Progress Notes (Signed)
  Medical Nutrition Therapy:  Appt start time: 1000 end time:  1030.   Assessment:  Primary concerns today: Sarah Goodman is here fo a follow up appointment pertaining to diabetes and morbid obesity.  Been admitted to hospital for glucose in the 400s and bad chest pains.  She had a bad UTI as well.  Now she is on lantus and her glucose is running in the 160s later on in the day.  Fasting glucose is the highest and it runs about 190 mg/dl.  She isn't as physically active as before the hospital encounter, but still lost 9 pounds!  MEDICATIONS: see list   DIETARY INTAKE:  Usual eating pattern includes 3 meals and 2 snacks per day.  Avoided foods include bread.    24-hr recall:  B ( AM): oatmeal or 2 boiled eggs with water  Snk ( AM): nutrigrain bar  L ( PM): salad with light italian dressing Snk ( PM): popcorn D ( PM): baked chicken, b beans and corn.  Cut all bread out totally Snk ( PM): water or fruit  Beverages: water  Usual physical activity: prior to being hospitalized, she was more active, now is less active, but doing house cleaning.  Starts water aerobics this weekend  Estimated energy needs: 1800 calories 200 g carbohydrates 135 g protein 50 g fat  Progress Towards Goal(s):  Some progress.   Nutritional Diagnosis:  NB-1.1 Food and nutrition-related knowledge deficit As related to proper balance of carbohydrates, fats, and proteins.  As evidenced by morbid obesity and uncontrolled DM.    Intervention:  Nutrition counseling provided.  Praised [place] for her dietary changes and subsequent weight loss.  Discussed her blood sugar readings.  She is always high first thing in the morning and sometimes high prior to bedtime.  Suggested she discuss medication management with her doctor and suggested she consider making appointment with endocrinology.  Encouraged daily body movement in the form of water aerobics, house cleaning, and chair exercises.  Continue the great  work!    Monitoring/Evaluation:  Dietary intake, exercise, BGM, and body weight prn.  Patient will call after scheduling appointment with endocrinology

## 2012-06-20 ENCOUNTER — Ambulatory Visit: Payer: Medicaid Other

## 2012-07-14 ENCOUNTER — Encounter: Payer: Self-pay | Admitting: Obstetrics & Gynecology

## 2012-08-04 ENCOUNTER — Ambulatory Visit: Payer: Medicaid Other | Admitting: Obstetrics & Gynecology

## 2012-08-05 ENCOUNTER — Ambulatory Visit: Payer: Medicaid Other | Admitting: Endocrinology

## 2012-08-05 ENCOUNTER — Ambulatory Visit (INDEPENDENT_AMBULATORY_CARE_PROVIDER_SITE_OTHER): Payer: Medicaid Other | Admitting: Obstetrics & Gynecology

## 2012-08-05 ENCOUNTER — Encounter: Payer: Self-pay | Admitting: Obstetrics & Gynecology

## 2012-08-05 VITALS — BP 131/88 | HR 86 | Temp 97.9°F | Ht 66.0 in | Wt 365.4 lb

## 2012-08-05 DIAGNOSIS — B372 Candidiasis of skin and nail: Secondary | ICD-10-CM

## 2012-08-05 DIAGNOSIS — N870 Mild cervical dysplasia: Secondary | ICD-10-CM | POA: Insufficient documentation

## 2012-08-05 MED ORDER — FLUCONAZOLE 50 MG PO TABS: 50.0000 mg | ORAL_TABLET | Freq: Every day | ORAL | Status: DC

## 2012-08-05 NOTE — Progress Notes (Signed)
.   Subjective:     Sarah Goodman is a 47 y.o. female here for a problem visit.   Current complaints: Patient states that she has a bad rash under the folds of her skin and around/inside her vagina (diaper rash like).  She used Monistat OTC yesterday (vaginally), but then tried to remove as much as she could due to the irritation.  Personal health questionnaire reviewed: yes.   Gynecologic History Patient's last menstrual period was 06/29/2012. Contraception: none Last Pap: 12/2011. Results were: LSIL Last mammogram: 2013. Results were: normal - Next appt. 08/11/12  Obstetric History OB History   Grav Para Term Preterm Abortions TAB SAB Ect Mult Living   4 4             # Outc Date GA Lbr Len/2nd Wgt Sex Del Anes PTL Lv   1 PAR            2 PAR            3 PAR            4 PAR                The following portions of the patient's history were reviewed and updated as appropriate: allergies, current medications, past family history, past medical history, past social history, past surgical history and problem list.  Review of Systems Pertinent items are noted in HPI.    Objective:   Erythematous, lichenified skin--abdomen, groin folds  Assessment:    Likely candida intertrigo  Plan:   Topical drying agents/loose fitting clothing Diflucan for 2 - 6 weeks F/U in a few weeks

## 2012-08-05 NOTE — Patient Instructions (Signed)
Intertrigo Intertrigo is a skin condition that occurs in between folds of skin in places on the body that rub together a lot and do not get much ventilation. It is caused by heat, moisture, friction, sweat retention, and lack of air circulation, which produces red, irritated patches and, sometimes, scaling or drainage. People who have diabetes, who are obese, or who have treatment with antibiotics are at increased risk for intertrigo. The most common sites for intertrigo to occur include:  The groin.  The breasts.  The armpits.  Folds of abdominal skin.  Webbed spaces between the fingers or toes. Intertrigo may be aggravated by:  Sweat.  Feces.  Yeast or bacteria that are present near skin folds.  Urine.  Vaginal discharge. HOME CARE INSTRUCTIONS  The following steps can be taken to reduce friction and keep the affected area cool and dry:  Expose skin folds to the air.  Keep deep skin folds separated with cotton or linen cloth. Avoid tight fitting clothing that could cause chafing.  Wear open-toed shoes or sandals to help reduce moisture between the toes.  Apply absorbent powders to affected areas as directed by your caregiver.  Apply over-the-counter barrier pastes, such as zinc oxide, as directed by your caregiver.  If you develop a fungal infection in the affected area, your caregiver may have you use antifungal creams. SEEK MEDICAL CARE IF:   The rash is not improving after 1 week of treatment.  The rash is getting worse (more red, more swollen, more painful, or spreading).  You have a fever or chills. MAKE SURE YOU:   Understand these instructions.  Will watch your condition.  Will get help right away if you are not doing well or get worse. Document Released: 01/26/2005 Document Revised: 04/20/2011 Document Reviewed: 07/11/2009 [employer] Patient Information 2014 [employer].  

## 2012-08-11 ENCOUNTER — Other Ambulatory Visit: Payer: Self-pay

## 2012-08-11 ENCOUNTER — Ambulatory Visit: Payer: Medicaid Other | Admitting: Obstetrics & Gynecology

## 2012-08-11 DIAGNOSIS — Z1231 Encounter for screening mammogram for malignant neoplasm of breast: Secondary | ICD-10-CM

## 2012-08-18 ENCOUNTER — Other Ambulatory Visit: Payer: Self-pay

## 2012-08-22 ENCOUNTER — Ambulatory Visit: Payer: Medicaid Other | Admitting: Obstetrics & Gynecology

## 2012-09-05 ENCOUNTER — Ambulatory Visit: Payer: Medicaid Other | Admitting: Obstetrics & Gynecology

## 2012-09-06 ENCOUNTER — Ambulatory Visit: Payer: Medicaid Other

## 2012-09-22 ENCOUNTER — Ambulatory Visit: Payer: Medicaid Other | Admitting: Obstetrics & Gynecology

## 2012-09-22 ENCOUNTER — Ambulatory Visit: Payer: Self-pay | Admitting: Obstetrics & Gynecology

## 2012-10-17 ENCOUNTER — Ambulatory Visit: Payer: Self-pay | Admitting: Obstetrics & Gynecology

## 2012-10-19 ENCOUNTER — Ambulatory Visit: Payer: Medicaid Other | Admitting: Obstetrics & Gynecology

## 2012-10-31 ENCOUNTER — Ambulatory Visit: Payer: Medicaid Other | Admitting: Obstetrics & Gynecology

## 2012-12-19 ENCOUNTER — Ambulatory Visit: Payer: Medicaid Other | Admitting: Obstetrics & Gynecology

## 2012-12-21 ENCOUNTER — Ambulatory Visit: Payer: Medicaid Other | Admitting: Obstetrics & Gynecology

## 2012-12-23 ENCOUNTER — Ambulatory Visit: Payer: Medicaid Other | Admitting: Obstetrics & Gynecology

## 2013-01-26 ENCOUNTER — Ambulatory Visit: Payer: Medicaid Other | Admitting: Internal Medicine

## 2013-02-23 ENCOUNTER — Ambulatory Visit: Payer: Medicaid Other

## 2013-03-13 ENCOUNTER — Ambulatory Visit: Payer: Medicaid Other | Admitting: Obstetrics & Gynecology

## 2013-03-22 ENCOUNTER — Ambulatory Visit: Payer: Medicaid Other | Admitting: Obstetrics & Gynecology

## 2013-04-07 ENCOUNTER — Other Ambulatory Visit: Payer: Self-pay

## 2013-04-07 DIAGNOSIS — Z1231 Encounter for screening mammogram for malignant neoplasm of breast: Secondary | ICD-10-CM

## 2013-04-13 ENCOUNTER — Other Ambulatory Visit (HOSPITAL_COMMUNITY): Payer: Self-pay | Admitting: Internal Medicine

## 2013-04-13 DIAGNOSIS — N949 Unspecified condition associated with female genital organs and menstrual cycle: Secondary | ICD-10-CM

## 2013-04-13 DIAGNOSIS — R109 Unspecified abdominal pain: Secondary | ICD-10-CM

## 2013-04-14 ENCOUNTER — Ambulatory Visit (HOSPITAL_COMMUNITY): Payer: Medicaid Other | Attending: Internal Medicine

## 2013-04-24 ENCOUNTER — Ambulatory Visit (HOSPITAL_COMMUNITY): Payer: Medicaid Other

## 2013-04-27 ENCOUNTER — Ambulatory Visit
Admission: RE | Admit: 2013-04-27 | Discharge: 2013-04-27 | Disposition: A | Discharge status: Home / Self Care | Payer: Medicaid Other | Source: Ambulatory Visit

## 2013-04-27 DIAGNOSIS — Z1231 Encounter for screening mammogram for malignant neoplasm of breast: Secondary | ICD-10-CM

## 2013-05-01 ENCOUNTER — Ambulatory Visit (HOSPITAL_COMMUNITY): Payer: Medicaid Other

## 2013-05-04 ENCOUNTER — Ambulatory Visit (HOSPITAL_COMMUNITY)
Admission: RE | Admit: 2013-05-04 | Discharge: 2013-05-04 | Disposition: A | Discharge status: Home / Self Care | Payer: Medicaid Other | Source: Ambulatory Visit | Attending: Internal Medicine | Admitting: Internal Medicine

## 2013-05-04 DIAGNOSIS — R109 Unspecified abdominal pain: Secondary | ICD-10-CM

## 2013-05-04 DIAGNOSIS — R1032 Left lower quadrant pain: Secondary | ICD-10-CM | POA: Insufficient documentation

## 2013-05-04 DIAGNOSIS — N949 Unspecified condition associated with female genital organs and menstrual cycle: Secondary | ICD-10-CM

## 2013-09-27 ENCOUNTER — Encounter: Payer: Self-pay | Admitting: Obstetrics & Gynecology

## 2013-09-27 ENCOUNTER — Ambulatory Visit (INDEPENDENT_AMBULATORY_CARE_PROVIDER_SITE_OTHER): Payer: Medicaid Other | Admitting: Obstetrics & Gynecology

## 2013-09-27 VITALS — BP 132/92 | HR 98 | Temp 97.1°F | Ht 69.0 in | Wt 333.0 lb

## 2013-09-27 DIAGNOSIS — N390 Urinary tract infection, site not specified: Secondary | ICD-10-CM

## 2013-09-27 DIAGNOSIS — Z3202 Encounter for pregnancy test, result negative: Secondary | ICD-10-CM

## 2013-09-27 DIAGNOSIS — B372 Candidiasis of skin and nail: Secondary | ICD-10-CM

## 2013-09-27 LAB — POCT URINALYSIS DIPSTICK
Bilirubin, UA: NEGATIVE
Ketones, UA: NEGATIVE
Leukocytes, UA: NEGATIVE
Nitrite, UA: NEGATIVE
Spec Grav, UA: 1.02
Urobilinogen, UA: NEGATIVE
pH, UA: 6

## 2013-09-27 LAB — POCT WET PREP (WET MOUNT)
Clue Cells Wet Prep Whiff POC: NEGATIVE
KOH Wet Prep POC: NEGATIVE
Trichomonas Wet Prep HPF POC: NEGATIVE

## 2013-09-27 LAB — POCT URINE PREGNANCY: Preg Test, Ur: NEGATIVE

## 2013-09-27 MED ORDER — CIPROFLOXACIN HCL 500 MG PO TABS: 250.0000 mg | ORAL_TABLET | Freq: Two times a day (BID) | ORAL | Status: DC

## 2013-09-27 MED ORDER — CLOTRIMAZOLE-BETAMETHASONE 1-0.05 % EX CREA: 1.0000 "application " | TOPICAL_CREAM | Freq: Two times a day (BID) | CUTANEOUS | Status: DC

## 2013-09-27 NOTE — Progress Notes (Signed)
Patient ID: Sarah Goodman, female   DOB: 06/21/1965, 48 y.o.   MRN: [id number]  Chief Complaint  Patient presents with  . Pelvic Pain  . Abdominal Pain  . Vaginitis    HPI Sarah Goodman is a 48 y.o. female.   Pelvic Pain The patient's primary symptoms include pelvic pain. Associated symptoms include abdominal pain and dysuria.  Abdominal Pain Associated symptoms include dysuria.  Dysuria  This is a new problem. The current episode started 1 to 4 weeks ago. The pain is moderate. Associated symptoms comments: incontinence. She has tried nothing for the symptoms. Her past medical history is significant for recurrent UTIs.    Past Medical History  Diagnosis Date  . Diabetes mellitus   . Headache(784.0)     migraines  . GERD (gastroesophageal reflux disease)   . Hypertension     Patient denies, no BP med , d/c'd by MD    Past Surgical History  Procedure Laterality Date  . Hysteroscopy w/d&c  12/18/2011    Procedure: DILATATION AND CURETTAGE /HYSTEROSCOPY;  Surgeon: Lahoma Crocker, MD;  Location: [facility];  Service: Gynecology;  Laterality: N/A;  . Colposcopy  12/18/2011    Procedure: COLPOSCOPY;  Surgeon: Lahoma Crocker, MD;  Location: [facility];  Service: Gynecology;  Laterality: N/A;    Family History  Problem Relation Age of Onset  . Diabetes Mother   . Hypertension Mother   . CAD Mother 42    Early onset CAD, with "blockage in artery and stent in heart"    Social History History  Substance Use Topics  . Smoking status: Never Smoker   . Smokeless tobacco: Not on file  . Alcohol Use: No    No Known Allergies  Current Outpatient Prescriptions  Medication Sig Dispense Refill  . albuterol (PROVENTIL HFA;VENTOLIN HFA) 108 (90 BASE) MCG/ACT inhaler Inhale 2 puffs into the lungs 3 (three) times daily as needed for wheezing or shortness of breath.       . insulin glargine (LANTUS) 100 units/mL SOLN Inject 24 Units into the skin daily at 10 pm.  3 mL  11  .  metFORMIN (GLUCOPHAGE) 850 MG tablet Take 1 tablet (850 mg total) by mouth 2 (two) times daily with a meal.      . NEEDLE, DISP, 30 G 30G X 1/2" MISC For use with Lantus Flex pen  2000 each  0  . ciprofloxacin (CIPRO) 500 MG tablet Take 0.5 tablets (250 mg total) by mouth 2 (two) times daily.  14 tablet  0  . clotrimazole-betamethasone (LOTRISONE) cream Apply 1 application topically 2 (two) times daily.  30 g  0   No current facility-administered medications for this visit.    Review of Systems Review of Systems  Gastrointestinal: Positive for abdominal pain.  Genitourinary: Positive for dysuria and pelvic pain.   Constitutional: negative for fatigue and weight loss Respiratory: negative for cough and wheezing Cardiovascular: negative for chest pain, fatigue and palpitations Gastrointestinal: negative for abdominal pain and change in bowel habits Genitourinary: c/o vaginal itching Integument/breast: negative for nipple discharge Musculoskeletal:negative for myalgias Neurological: negative for gait problems and tremors Behavioral/Psych: negative for abusive relationship, depression Endocrine: negative for temperature intolerance     Blood pressure 132/92, pulse 98, temperature 97.1 F (36.2 C), height 5\' 9"  (1.753 m), weight 151.048 kg (333 lb).  Physical Exam Physical Exam General:   alert  Skin:   no rash or abnormalities  Lungs:   clear to auscultation bilaterally  Heart:  regular rate and rhythm, S1, S2 normal, no murmur, click, rub or gallop  Abdomen:  normal findings: no organomegaly, soft, non-tender and no hernia  Pelvis:  External genitalia: lichenified skin, fissures Urinary system: urethral meatus normal and bladder with mild tenderness Vaginal: normal without tenderness, induration or masses Cervix: normal appearance Adnexa: normal bimanual exam Uterus: anteverted and non-tender, normal size       Data Reviewed Urine dip/UPT  Assessment    UTI Cutaneous  candida--in the setting of poorly controlled DM     Plan    Meds ordered this encounter  Medications  . ciprofloxacin (CIPRO) 500 MG tablet    Sig: Take 0.5 tablets (250 mg total) by mouth 2 (two) times daily.    Dispense:  14 tablet    Refill:  0  . clotrimazole-betamethasone (LOTRISONE) cream    Sig: Apply 1 application topically 2 (two) times daily.    Dispense:  30 g    Refill:  0   Orders Placed This Encounter  Procedures  . WET PREP BY MOLECULAR PROBE  . Urine culture  . Urinalysis, Routine w reflex microscopic  . POCT urinalysis dipstick  . POCT urine pregnancy    Uribel Possible management options include: po hydration, keep skin dry, loose fitting clothing Follow up as needed.         JACKSON-MOORE,Townes Fuhs A 09/27/2013, 11:57 AM

## 2013-09-27 NOTE — Patient Instructions (Signed)
Urinary Tract Infection Urinary tract infections (UTIs) can develop anywhere along your urinary tract. Your urinary tract is your body's drainage system for removing wastes and extra water. Your urinary tract includes two kidneys, two ureters, a bladder, and a urethra. Your kidneys are a pair of bean-shaped organs. Each kidney is about the size of your fist. They are located below your ribs, one on each side of your spine. CAUSES Infections are caused by microbes, which are microscopic organisms, including fungi, viruses, and bacteria. These organisms are so small that they can only be seen through a microscope. Bacteria are the microbes that most commonly cause UTIs. SYMPTOMS  Symptoms of UTIs may vary by age and gender of the patient and by the location of the infection. Symptoms in young women typically include a frequent and intense urge to urinate and a painful, burning feeling in the bladder or urethra during urination. Older women and men are more likely to be tired, shaky, and weak and have muscle aches and abdominal pain. A fever may mean the infection is in your kidneys. Other symptoms of a kidney infection include pain in your back or sides below the ribs, nausea, and vomiting. DIAGNOSIS To diagnose a UTI, your caregiver will ask you about your symptoms. Your caregiver also will ask to provide a urine sample. The urine sample will be tested for bacteria and white blood cells. White blood cells are made by your body to help fight infection. TREATMENT  Typically, UTIs can be treated with medication. Because most UTIs are caused by a bacterial infection, they usually can be treated with the use of antibiotics. The choice of antibiotic and length of treatment depend on your symptoms and the type of bacteria causing your infection. HOME CARE INSTRUCTIONS  If you were prescribed antibiotics, take them exactly as your caregiver instructs you. Finish the medication even if you feel better after you  have only taken some of the medication.  Drink enough water and fluids to keep your urine clear or pale yellow.  Avoid caffeine, tea, and carbonated beverages. They tend to irritate your bladder.  Empty your bladder often. Avoid holding urine for long periods of time.  Empty your bladder before and after sexual intercourse.  After a bowel movement, women should cleanse from front to back. Use each tissue only once. SEEK MEDICAL CARE IF:   You have back pain.  You develop a fever.  Your symptoms do not begin to resolve within 3 days. SEEK IMMEDIATE MEDICAL CARE IF:   You have severe back pain or lower abdominal pain.  You develop chills.  You have nausea or vomiting.  You have continued burning or discomfort with urination. MAKE SURE YOU:   Understand these instructions.  Will watch your condition.  Will get help right away if you are not doing well or get worse. Document Released: 11/05/2004 Document Revised: 07/28/2011 Document Reviewed: 03/06/2011 [facility] Patient Information 2015 [place], [place]. This information is not intended to replace advice given to you by your health care provider. Make sure you discuss any questions you have with your health care provider. Yeast Infection of the Skin Some yeast on the skin is normal, but sometimes it causes an infection. If you have a yeast infection, it shows up as white or light brown patches on brown skin. You can see it better in the summer on tan skin. It causes light-colored holes in your suntan. It can happen on any area of the body. This cannot be passed  from person to person. HOME CARE  Scrub your skin daily with a dandruff shampoo. Your rash may take a couple weeks to get well.  Do not scratch or itch the rash. GET HELP RIGHT AWAY IF:   You get another infection from scratching. The skin may get warm, red, and may ooze fluid.  The infection does not seem to be getting better. MAKE SURE YOU:  Understand these  instructions.  Will watch your condition.  Will get help right away if you are not doing well or get worse. Document Released: 01/09/2008 Document Revised: 04/20/2011 Document Reviewed: 01/09/2008 [facility] Patient Information 2015 [place], [place]. This information is not intended to replace advice given to you by your health care provider. Make sure you discuss any questions you have with your health care provider.

## 2013-09-28 LAB — URINALYSIS, MICROSCOPIC ONLY
Casts: NONE SEEN
Crystals: NONE SEEN

## 2013-09-28 LAB — URINALYSIS, ROUTINE W REFLEX MICROSCOPIC
Bilirubin Urine: NEGATIVE
Glucose, UA: >1000 mg/dL — AB
Hgb urine dipstick: NEGATIVE
Ketones, ur: NEGATIVE mg/dL
Nitrite: POSITIVE — AB
Protein, ur: NEGATIVE mg/dL
Specific Gravity, Urine: >1.03 — ABNORMAL HIGH (ref 1.005–1.030)
Urobilinogen, UA: 1 mg/dL (ref 0.0–1.0)
pH: 6 (ref 5.0–8.0)

## 2013-09-28 LAB — WET PREP BY MOLECULAR PROBE
Candida species: POSITIVE — AB
Gardnerella vaginalis: NEGATIVE
Trichomonas vaginosis: NEGATIVE

## 2013-09-30 LAB — URINE CULTURE: Colony Count: >=100000

## 2013-10-04 ENCOUNTER — Other Ambulatory Visit: Payer: Self-pay | Admitting: *Deleted

## 2013-10-04 DIAGNOSIS — B379 Candidiasis, unspecified: Secondary | ICD-10-CM

## 2013-10-04 MED ORDER — FLUCONAZOLE 150 MG PO TABS: 150.0000 mg | ORAL_TABLET | ORAL | Status: DC

## 2013-12-11 ENCOUNTER — Encounter: Payer: Self-pay | Admitting: Obstetrics & Gynecology

## 2014-01-18 ENCOUNTER — Encounter (HOSPITAL_COMMUNITY): Payer: Self-pay | Admitting: Interventional Cardiology

## 2014-02-05 ENCOUNTER — Encounter: Payer: Self-pay | Admitting: *Deleted

## 2014-02-06 ENCOUNTER — Encounter: Payer: Self-pay | Admitting: Obstetrics & Gynecology

## 2014-03-30 ENCOUNTER — Encounter: Payer: Self-pay | Admitting: Certified Nurse Midwife

## 2014-03-30 ENCOUNTER — Ambulatory Visit (INDEPENDENT_AMBULATORY_CARE_PROVIDER_SITE_OTHER): Payer: Medicaid Other | Admitting: Certified Nurse Midwife

## 2014-03-30 VITALS — BP 120/83 | HR 101 | Temp 98.5°F | Ht 69.0 in | Wt 330.0 lb

## 2014-03-30 DIAGNOSIS — L089 Local infection of the skin and subcutaneous tissue, unspecified: Secondary | ICD-10-CM

## 2014-03-30 DIAGNOSIS — Z0000 Encounter for general adult medical examination without abnormal findings: Secondary | ICD-10-CM | POA: Diagnosis not present

## 2014-03-30 DIAGNOSIS — L0292 Furuncle, unspecified: Secondary | ICD-10-CM | POA: Diagnosis not present

## 2014-03-30 DIAGNOSIS — A499 Bacterial infection, unspecified: Secondary | ICD-10-CM | POA: Diagnosis not present

## 2014-03-30 DIAGNOSIS — E138 Other specified diabetes mellitus with unspecified complications: Secondary | ICD-10-CM | POA: Diagnosis not present

## 2014-03-30 DIAGNOSIS — B9689 Other specified bacterial agents as the cause of diseases classified elsewhere: Secondary | ICD-10-CM

## 2014-03-30 DIAGNOSIS — N3281 Overactive bladder: Secondary | ICD-10-CM | POA: Diagnosis not present

## 2014-03-30 DIAGNOSIS — B373 Candidiasis of vulva and vagina: Secondary | ICD-10-CM | POA: Diagnosis not present

## 2014-03-30 DIAGNOSIS — N926 Irregular menstruation, unspecified: Secondary | ICD-10-CM | POA: Diagnosis not present

## 2014-03-30 DIAGNOSIS — B3731 Acute candidiasis of vulva and vagina: Secondary | ICD-10-CM

## 2014-03-30 DIAGNOSIS — Z01419 Encounter for gynecological examination (general) (routine) without abnormal findings: Secondary | ICD-10-CM

## 2014-03-30 LAB — POCT URINALYSIS DIPSTICK
Blood, UA: NEGATIVE
Glucose, UA: 250
Leukocytes, UA: NEGATIVE
Nitrite, UA: NEGATIVE
Spec Grav, UA: 1.015
pH, UA: 5

## 2014-03-30 LAB — POCT URINE PREGNANCY: Preg Test, Ur: NEGATIVE

## 2014-03-30 MED ORDER — AMOXICILLIN-POT CLAVULANATE 875-125 MG PO TABS: 1.0000 | ORAL_TABLET | Freq: Two times a day (BID) | ORAL | Status: AC

## 2014-03-30 MED ORDER — NYSTATIN 100000 UNIT/GM EX POWD: CUTANEOUS | Status: DC

## 2014-03-30 MED ORDER — FLUCONAZOLE 100 MG PO TABS: 100.0000 mg | ORAL_TABLET | Freq: Once | ORAL | Status: AC

## 2014-03-30 NOTE — Progress Notes (Signed)
Patient ID: Sarah Goodman, female   DOB: December 22, 1965, 49 y.o.   MRN: [id number]   Subjective:     Sarah Goodman is a 49 y.o. female here for a routine exam.  Current complaints: pubic abscess that opened last night and is draining with a foul smell, groin irritation and reports having to urinate 7+ times during the night and occassionally does not make it to the bathroom.    Personal health questionnaire:  Is patient [name], have a family history of breast and/or ovarian cancer: no Is there a family history of uterine cancer diagnosed at age < 7, gastrointestinal cancer, urinary tract cancer, family member who is a Field seismologist syndrome-associated carrier: no Is the patient overweight and hypertensive, family history of diabetes, personal history of gestational diabetes, preeclampsia or PCOS: yes Is patient over 41, have PCOS,  family history of premature CHD under age 107, diabetes, smoke, have hypertension or peripheral artery disease:  yes At any time, has a partner hit, kicked or otherwise hurt or frightened you?: no Over the past 2 weeks, have you felt down, depressed or hopeless?: no Over the past 2 weeks, have you felt little interest or pleasure in doing things?:no   Gynecologic History Patient's last menstrual period was 11/20/2013 (approximate). Contraception: none Last Pap: 12/2011. Results were: abnormal, LSIL Last mammogram: 04/27/2013. Results were: normal  Obstetric History OB History  Gravida Para Term Preterm AB SAB TAB Ectopic Multiple Living  4 4        4     # Outcome Date GA Lbr Len/2nd Weight Sex Delivery Anes PTL Lv  4 Para         Y  3 Para         Y  2 Para         Y  1 Para         Y      Past Medical History  Diagnosis Date  . Diabetes mellitus   . Headache(784.0)     migraines  . GERD (gastroesophageal reflux disease)   . Hypertension     Patient denies, no BP med , d/c'd by MD    Past Surgical History  Procedure Laterality Date  .  Hysteroscopy w/d&c  12/18/2011    Procedure: DILATATION AND CURETTAGE /HYSTEROSCOPY;  Surgeon: Lahoma Crocker, MD;  Location: [facility];  Service: Gynecology;  Laterality: N/A;  . Colposcopy  12/18/2011    Procedure: COLPOSCOPY;  Surgeon: Lahoma Crocker, MD;  Location: [facility];  Service: Gynecology;  Laterality: N/A;  . Left heart catheterization with coronary angiogram N/A 05/26/2012    Procedure: LEFT HEART CATHETERIZATION WITH CORONARY ANGIOGRAM;  Surgeon: Sinclair Grooms, MD;  Location: [facility] C[facility];  Service: Cardiovascular;  Laterality: N/A;     Current outpatient prescriptions:  .  albuterol (PROVENTIL HFA;VENTOLIN HFA) 108 (90 BASE) MCG/ACT inhaler, Inhale 2 puffs into the lungs 3 (three) times daily as needed for wheezing or shortness of breath. , Disp: , Rfl:  .  insulin glargine (LANTUS) 100 units/mL SOLN, Inject 24 Units into the skin daily at 10 pm., Disp: 3 mL, Rfl: 11 .  metFORMIN (GLUCOPHAGE) 850 MG tablet, Take 1 tablet (850 mg total) by mouth 2 (two) times daily with a meal., Disp: , Rfl:  .  NEEDLE, DISP, 30 G 30G X 1/2" MISC, For use with Lantus Flex pen, Disp: 2000 each, Rfl: 0 .  amoxicillin-clavulanate (AUGMENTIN) 875-125 MG per tablet, Take 1 tablet by mouth 2 (  two) times daily. For 14 days., Disp: 28 tablet, Rfl: 0 .  ciprofloxacin (CIPRO) 500 MG tablet, Take 0.5 tablets (250 mg total) by mouth 2 (two) times daily. (Patient not taking: Reported on 03/30/2014), Disp: 14 tablet, Rfl: 0 .  clotrimazole-betamethasone (LOTRISONE) cream, Apply 1 application topically 2 (two) times daily. (Patient not taking: Reported on 03/30/2014), Disp: 30 g, Rfl: 0 .  fluconazole (DIFLUCAN) 100 MG tablet, Take 1 tablet (100 mg total) by mouth once. Repeat dose in 48-72 hours., Disp: 2 tablet, Rfl: 1 .  nystatin (MYCOSTATIN/NYSTOP) 100000 UNIT/GM POWD, Apply to groin area., Disp: 1 Bottle, Rfl: 4 No Known Allergies  History  Substance Use Topics  . Smoking status: Never Smoker   . Smokeless  tobacco: Not on file  . Alcohol Use: No    Family History  Problem Relation Age of Onset  . Diabetes Mother   . Hypertension Mother   . CAD Mother 93    Early onset CAD, with "blockage in artery and stent in heart"      Review of Systems  Constitutional: negative for fatigue and weight loss Respiratory: negative for cough and wheezing Cardiovascular: negative for chest pain, fatigue and palpitations Gastrointestinal: negative for abdominal pain and change in bowel habits Musculoskeletal:negative for myalgias Neurological: negative for gait problems and tremors Behavioral/Psych: negative for abusive relationship, depression Endocrine: negative for temperature intolerance, does have hot flashes   Genitourinary:occasional spotting for menstrual periods no period since October. Denies sexual problems and vaginal discharge Integument/breast: negative for breast lump, breast tenderness, nipple discharge and skin lesion(s)    Objective:       BP 120/83 mmHg  Pulse 101  Temp(Src) 98.5 F (36.9 C)  Ht 5\' 9"  (1.753 m)  Wt 149.687 kg (330 lb)  BMI 48.71 kg/m2  LMP 11/20/2013 (Approximate) General:   alert  Skin:   no rash.  Abscess on pubic area, draining green puss. Roughly 9cmX4cm wide, tender and hot to palpation  Lungs:   clear to auscultation bilaterally  Heart:   regular rate and rhythm, S1, S2 normal, no murmur, click, rub or gallop  Breasts:   normal without suspicious masses, skin or nipple changes or axillary nodes  Abdomen:  normal findings: no organomegaly, soft, non-tender and no hernia, unable to fully assess due to body habitus  Pelvis:  External genitalia: normal general appearance Urinary system: urethral meatus normal and bladder without fullness, nontender Vaginal: normal without tenderness, induration or masses Cervix: normal appearance Adnexa: normal bimanual exam Uterus: anteverted and non-tender, normal size   Lab Review Urine pregnancy test Labs  reviewed yes Radiologic studies reviewed yes  25% of 45 min visit spent on counseling and coordination of care.    Assessment:    Normal GYN female exam.  Morbid obesity Pubic abscess from folliculitis Diabetes Overactive Bladder   Plan:    Education reviewed: low fat, low cholesterol diet and safe sex/STD prevention. Contraception: none. Mammogram ordered. Follow up in: 2 weeks.   Meds ordered this encounter  Medications  . amoxicillin-clavulanate (AUGMENTIN) 875-125 MG per tablet    Sig: Take 1 tablet by mouth 2 (two) times daily. For 14 days.    Dispense:  28 tablet    Refill:  0  . nystatin (MYCOSTATIN/NYSTOP) 100000 UNIT/GM POWD    Sig: Apply to groin area.    Dispense:  1 Bottle    Refill:  4  . fluconazole (DIFLUCAN) 100 MG tablet    Sig: Take 1 tablet (100 mg  total) by mouth once. Repeat dose in 48-72 hours.    Dispense:  2 tablet    Refill:  1   Orders Placed This Encounter  Procedures  . Wound culture  . SureSwab, Vaginosis/Vaginitis Plus  . MM DIGITAL SCREENING BILATERAL    Standing Status: Future     Number of Occurrences:      Standing Expiration Date: 05/29/2015    Order Specific Question:  Reason for Exam (SYMPTOM  OR DIAGNOSIS REQUIRED)    Answer:  routiene examination    Order Specific Question:  Is the patient pregnant?    Answer:  No    Order Specific Question:  Preferred imaging location?    Answer:  [facility]  . RPR  . Hepatitis B surface antigen  . Hepatitis C antibody  . HIV antibody (with reflex)  . Amb ref to Medical Nutrition Therapy-MNT    Referral Priority:  Routine    Referral Type:  Consultation    Referral Reason:  Specialty Services Required    Requested Specialty:  Nutrition    Number of Visits Requested:  1  . Ambulatory referral to Urology    Referral Priority:  Routine    Referral Type:  Consultation    Referral Reason:  Specialty Services Required    Requested Specialty:  Urology    Number of Visits Requested:   1  . POCT urinalysis dipstick  . POCT urine pregnancy   Gastric by-pass surgery information given.

## 2014-03-31 LAB — HEPATITIS C ANTIBODY: HCV Ab: NEGATIVE

## 2014-03-31 LAB — RPR

## 2014-03-31 LAB — HEPATITIS B SURFACE ANTIGEN: Hepatitis B Surface Ag: NEGATIVE

## 2014-03-31 LAB — HIV ANTIBODY (ROUTINE TESTING W REFLEX): HIV 1&2 Ab, 4th Generation: NONREACTIVE

## 2014-04-02 ENCOUNTER — Telehealth: Payer: Self-pay

## 2014-04-02 LAB — PAP IG AND HPV HIGH-RISK: HPV DNA High Risk: NOT DETECTED

## 2014-04-02 LAB — WOUND CULTURE
Gram Stain: NONE SEEN
Gram Stain: NONE SEEN

## 2014-04-02 NOTE — Telephone Encounter (Signed)
patient has appt with Dr. Nicki Reaper Macdiarmid at [facility] on 3/17 at 9:45am - patient knows of appt - they will send out new patient packet to her

## 2014-04-03 LAB — SURESWAB, VAGINOSIS/VAGINITIS PLUS
Atopobium vaginae: NOT DETECTED Log (cells/mL)
C. albicans, DNA: NOT DETECTED
C. glabrata, DNA: NOT DETECTED
C. parapsilosis, DNA: NOT DETECTED
C. trachomatis RNA, TMA: NOT DETECTED
C. tropicalis, DNA: NOT DETECTED
Gardnerella vaginalis: NOT DETECTED Log (cells/mL)
LACTOBACILLUS SPECIES: 6.7 Log (cells/mL)
MEGASPHAERA SPECIES: NOT DETECTED Log (cells/mL)
N. gonorrhoeae RNA, TMA: NOT DETECTED
T. vaginalis RNA, QL TMA: NOT DETECTED

## 2014-04-13 ENCOUNTER — Ambulatory Visit: Payer: Medicaid Other | Admitting: Certified Nurse Midwife

## 2014-04-17 ENCOUNTER — Ambulatory Visit: Payer: Medicaid Other | Admitting: Certified Nurse Midwife

## 2014-04-30 ENCOUNTER — Ambulatory Visit: Payer: Medicaid Other | Admitting: Dietician

## 2014-04-30 ENCOUNTER — Ambulatory Visit (HOSPITAL_COMMUNITY): Payer: Medicaid Other

## 2014-05-02 ENCOUNTER — Ambulatory Visit (HOSPITAL_COMMUNITY): Admission: RE | Admit: 2014-05-02 | Payer: Medicaid Other | Source: Ambulatory Visit

## 2014-05-22 ENCOUNTER — Ambulatory Visit: Payer: Medicaid Other | Admitting: Dietician

## 2014-05-22 ENCOUNTER — Telehealth: Payer: Self-pay

## 2014-05-22 NOTE — Telephone Encounter (Signed)
left message regarding Nutrition missed appt today, 4/12, at 10am - called patient and told her to resch an appt with them - call them at [phone]

## 2014-06-01 ENCOUNTER — Other Ambulatory Visit: Payer: Self-pay | Admitting: Certified Nurse Midwife

## 2014-06-01 ENCOUNTER — Ambulatory Visit (HOSPITAL_COMMUNITY)
Admission: RE | Admit: 2014-06-01 | Discharge: 2014-06-01 | Disposition: A | Discharge status: Home / Self Care | Payer: Medicaid Other | Source: Ambulatory Visit | Attending: Certified Nurse Midwife | Admitting: Certified Nurse Midwife

## 2014-06-01 DIAGNOSIS — Z01419 Encounter for gynecological examination (general) (routine) without abnormal findings: Secondary | ICD-10-CM

## 2014-06-01 DIAGNOSIS — Z1231 Encounter for screening mammogram for malignant neoplasm of breast: Secondary | ICD-10-CM | POA: Insufficient documentation

## 2015-02-19 ENCOUNTER — Other Ambulatory Visit: Payer: Self-pay

## 2015-02-19 DIAGNOSIS — Z1231 Encounter for screening mammogram for malignant neoplasm of breast: Secondary | ICD-10-CM

## 2015-02-26 ENCOUNTER — Other Ambulatory Visit: Payer: Self-pay | Admitting: Certified Nurse Midwife

## 2015-02-26 ENCOUNTER — Telehealth: Payer: Self-pay | Admitting: *Deleted

## 2015-02-26 DIAGNOSIS — N912 Amenorrhea, unspecified: Secondary | ICD-10-CM

## 2015-02-26 NOTE — Telephone Encounter (Signed)
I will order pelvic US on her.  We may need to do some labs in Feb.  However, my guess is it is due to her weight.  Thank you.  R.Shelaine Frie CNM

## 2015-02-26 NOTE — Telephone Encounter (Signed)
Patient states she has an appointment for her annual exam in February- but she is having pain with the cyst on her L ovary. 3:30 Call to patient- she states she has not had a cycle in over a year- but did spot for 1 day last month- she has not been diagnosed menopausal in her chart. Her last [place] was 2015 and there was no cyst at that time. She is not sexually active. Told patient I would discuss her symptoms with her provider and see if we need to see her before that appointment.

## 2015-02-27 ENCOUNTER — Encounter: Payer: Self-pay | Admitting: Neurology

## 2015-02-27 ENCOUNTER — Ambulatory Visit (INDEPENDENT_AMBULATORY_CARE_PROVIDER_SITE_OTHER): Payer: Medicaid Other | Admitting: Neurology

## 2015-02-27 VITALS — BP 130/90 | HR 118 | Ht 69.0 in | Wt 322.0 lb

## 2015-02-27 DIAGNOSIS — IMO0002 Reserved for concepts with insufficient information to code with codable children: Secondary | ICD-10-CM

## 2015-02-27 DIAGNOSIS — Z6841 Body Mass Index (BMI) 40.0 and over, adult: Secondary | ICD-10-CM | POA: Diagnosis not present

## 2015-02-27 DIAGNOSIS — G43709 Chronic migraine without aura, not intractable, without status migrainosus: Secondary | ICD-10-CM

## 2015-02-27 MED ORDER — NORTRIPTYLINE HCL 50 MG PO CAPS: ORAL_CAPSULE | ORAL | Status: DC

## 2015-02-27 MED ORDER — INDERAL LA 120 MG PO CP24: 120.0000 mg | ORAL_CAPSULE | Freq: Every day | ORAL | Status: DC

## 2015-02-27 MED ORDER — SUMATRIPTAN SUCCINATE 25 MG PO TABS: 25.0000 mg | ORAL_TABLET | ORAL | Status: DC | PRN

## 2015-02-27 NOTE — Progress Notes (Addendum)
PATIENT: Sarah Goodman DOB: 1965-09-15  Chief Complaint  Patient presents with  . Migraine    Reports having some type of headache daily.  She had been on Topamax for years but felt it stopped working.  The Topamax was discontinued one week ago and she was placed on nortriptyline 25mg  qhs.  She does not use any rescue medications.  She does use OTC NSAIDS but they only provide minimal relief.  . Memory Loss    MMSE 26/30 - 8 animals.  Reports having an acute onset of memory difficulty one week ago.     HISTORICAL  Sarah Goodman is a 50 years old right-handed female, seen in refer by her primary care doctor Benito Mccreedy, MD in February 27 2015 for evaluation of frequent headaches  I have reviewed and summarized her primary care's note, she had a history of insulin-dependent diabetes, hypertension, chronic migraine, asthma, obesity, most recent glucose was 205  She reported a long-standing history of migraine since age twenties, her typical migraine are: Cranial severe pounding headache with associated light noise sensitivity, she used to have headaches couple times each months, recently, she is under extreme stress, since early January 2017, she has daily moderate to severe headaches, left frontal temporal region pressure headache, with light sensitivity, 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  She was getting a prescription of nortriptyline 25 mg 3 times a day couple weeks ago, she can tolerate the medicine, but no significant benefit notice, in addition, she is concerned about mild elevated blood pressure tachycardia, today her blood pressure is 130/80, heart rate of 90  REVIEW OF SYSTEMS: Full 14 system review of systems performed and notable only for fever, chill, weight gain, blurry vision, memory loss,  confusion, headaches, dizziness, feeling hot  ALLERGIES: No Known Allergies  HOME MEDICATIONS: Current Outpatient Prescriptions  Medication Sig Dispense Refill  . albuterol (PROVENTIL HFA;VENTOLIN HFA) 108 (90 BASE) MCG/ACT inhaler Inhale 2 puffs into the lungs 3 (three) times daily as needed for wheezing or shortness of breath.     . exenatide (BYETTA) 10 MCG/0.04ML SOPN injection Inject 10 mcg into the skin 2 (two) times daily with a meal.    . metFORMIN (GLUCOPHAGE) 850 MG tablet Take 1 tablet (850 mg total) by mouth 2 (two) times daily with a meal.    . nortriptyline (PAMELOR) 25 MG capsule Take 25 mg by mouth at bedtime.    Marland Kitchen NOVOLOG MIX 70/30 FLEXPEN (70-30) 100 UNIT/ML FlexPen INJECT 20 UNITS SUBCUTANEOUSLY BID  3  . omeprazole (PRILOSEC) 20 MG capsule TK ONE C PO  QD  0   No current facility-administered medications for this visit.    PAST MEDICAL HISTORY: Past Medical History  Diagnosis Date  . Diabetes mellitus   . Headache(784.0)     migraines  . GERD (gastroesophageal reflux disease)   . Hypertension     Patient denies, no BP med , d/c'd by MD  . Migraine   . Memory difficulty     PAST SURGICAL HISTORY: Past Surgical History  Procedure Laterality Date  . Hysteroscopy w/d&c  12/18/2011    Procedure: DILATATION AND CURETTAGE /HYSTEROSCOPY;  Surgeon: Lahoma Crocker, MD;  Location: [facility];  Service: Gynecology;  Laterality: N/A;  . Colposcopy  12/18/2011    Procedure: COLPOSCOPY;  Surgeon: Lahoma Crocker, MD;  Location:  [facility];  Service: Gynecology;  Laterality: N/A;  . Left heart catheterization with coronary angiogram N/A 05/26/2012    Procedure: LEFT HEART CATHETERIZATION WITH CORONARY ANGIOGRAM;  Surgeon: Sinclair Grooms, MD;  Location: [facility] CATH LAB;  Service: Cardiovascular;  Laterality: N/A;    FAMILY HISTORY: Family History  Problem Relation Age of Onset  . Diabetes Mother   . Hypertension Mother   . CAD Mother 61    Early onset CAD, with "blockage  in artery and stent in heart"    SOCIAL HISTORY:  Social History   Social History  . Marital Status: Single    Spouse Name: N/A  . Number of Children: 4  . Years of Education: 12   Occupational History  . Unemployed    Social History Main Topics  . Smoking status: Never Smoker   . Smokeless tobacco: Not on file  . Alcohol Use: No  . Drug Use: No  . Sexual Activity:    Partners: Male    Birth Control/ Protection: None   Other Topics Concern  . Not on file   Social History Narrative   Lives at home with her children.   Right-handed.   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     PHYSICAL EXAM   Filed Vitals:   02/27/15 1019  BP: 130/90  Pulse: 118  Height: 5\' 9"  (1.753 m)  Weight: 322 lb (146.058 kg)    Not recorded      Body mass index is 47.53 kg/(m^2).  PHYSICAL EXAMNIATION:  Gen: NAD, conversant, well nourised, obese, well groomed                     Cardiovascular: Regular rate rhythm, no peripheral edema, warm, nontender. Eyes: Conjunctivae clear without exudates or hemorrhage Neck: Supple, no carotid bruise. Pulmonary: Clear to auscultation bilaterally   NEUROLOGICAL EXAM:  MENTAL STATUS: Speech:    Speech is normal; fluent and spontaneous with normal comprehension.  Cognition: MMSE 26/30, animal naming 8     Orientation to time, place and person     Recent and remote memory: she missed 3/3 recalls     Normal Attention span and concentration     Normal Language, naming, repeating,spontaneous speech     Fund of knowledge   CRANIAL NERVES: CN II: Visual fields are full to confrontation. Fundoscopic exam is normal with sharp discs and no vascular changes. Pupils are round equal and briskly reactive to light. CN III, IV, VI: extraocular movement are normal. No ptosis. CN V: Facial sensation is intact to pinprick in all 3 divisions bilaterally. Corneal responses are intact.  CN VII: Face is symmetric with normal eye closure and smile. CN VIII: Hearing is normal to  rubbing fingers CN IX, X: Palate elevates symmetrically. Phonation is normal. CN XI: Head turning and shoulder shrug are intact CN XII: Tongue is midline with normal movements and no atrophy.  MOTOR: There is no pronator drift of out-stretched arms. Muscle bulk and tone are normal. Muscle strength is normal.  REFLEXES: Reflexes are 2+ and symmetric at the biceps, triceps, knees, and ankles. Plantar responses are flexor.  SENSORY: Intact to light touch, pinprick, position sense, and vibration sense are intact in fingers and toes.  COORDINATION: Rapid alternating movements and fine finger movements are intact. There is no dysmetria on finger-to-nose and heel-knee-shin.    GAIT/STANCE: Posture is normal. Gait is steady with normal steps, base, arm swing, and turning. Heel and toe walking are normal. Tandem gait is  normal.  Romberg is absent.   DIAGNOSTIC DATA (LABS, IMAGING, TESTING) - I reviewed patient records, labs, notes, testing and imaging myself where available.   ASSESSMENT AND PLAN  DALY LANGO is a 50 y.o. female    New onset daily headaches, history of migraine  Increase preventive medications nortriptyline to 50 mg 2 tablets every night  Add on Inderal LA 120 mg daily  Imitrex 25 mg as needed  MRI of brain without contrast  Short-term memory trouble  Laboratory evaluations, TSH, B12, CMP, A1c at next follow-up visit  [name], M.D. Ph.D.  [facility] [address], [place], [place] [address] Ph: [phone] Fax: [phone]  CC: Benito Mccreedy, MD

## 2015-02-28 ENCOUNTER — Other Ambulatory Visit: Payer: Self-pay | Admitting: *Deleted

## 2015-02-28 ENCOUNTER — Telehealth: Payer: Self-pay | Admitting: Neurology

## 2015-02-28 MED ORDER — PROPRANOLOL HCL 60 MG PO TABS: 60.0000 mg | ORAL_TABLET | Freq: Two times a day (BID) | ORAL | Status: DC

## 2015-02-28 NOTE — Telephone Encounter (Signed)
Patient advised to expect a call to schedule an [place].

## 2015-02-28 NOTE — Telephone Encounter (Signed)
Pt called said Medicaid will not pay for BP med and migraine medication. She said will need to get PA.

## 2015-02-28 NOTE — Telephone Encounter (Signed)
Per Dr. Krista Blue, Inderal LA has been changed to propranolol 60mg  BID.  She was able to get the nortriptyline.

## 2015-03-01 NOTE — Telephone Encounter (Signed)
I spoke with Shayla at [facility].  She stated Sumatriptan does not require a prior auth.  Sates there was a paid claim on Jan 13, so it is just refill too soon until Feb 11.  I called and spoke with the pharmacy.  They are aware and will note file.

## 2015-03-05 ENCOUNTER — Encounter (HOSPITAL_COMMUNITY): Payer: Self-pay | Admitting: *Deleted

## 2015-03-05 DIAGNOSIS — G43709 Chronic migraine without aura, not intractable, without status migrainosus: Secondary | ICD-10-CM | POA: Diagnosis not present

## 2015-03-05 DIAGNOSIS — Z79899 Other long term (current) drug therapy: Secondary | ICD-10-CM | POA: Insufficient documentation

## 2015-03-05 DIAGNOSIS — R51 Headache: Secondary | ICD-10-CM | POA: Diagnosis present

## 2015-03-05 DIAGNOSIS — Z794 Long term (current) use of insulin: Secondary | ICD-10-CM | POA: Diagnosis not present

## 2015-03-05 DIAGNOSIS — Z7984 Long term (current) use of oral hypoglycemic drugs: Secondary | ICD-10-CM | POA: Diagnosis not present

## 2015-03-05 DIAGNOSIS — E669 Obesity, unspecified: Secondary | ICD-10-CM | POA: Insufficient documentation

## 2015-03-05 DIAGNOSIS — K219 Gastro-esophageal reflux disease without esophagitis: Secondary | ICD-10-CM | POA: Diagnosis not present

## 2015-03-05 DIAGNOSIS — I10 Essential (primary) hypertension: Secondary | ICD-10-CM | POA: Diagnosis not present

## 2015-03-05 DIAGNOSIS — E119 Type 2 diabetes mellitus without complications: Secondary | ICD-10-CM | POA: Diagnosis not present

## 2015-03-05 DIAGNOSIS — Z9889 Other specified postprocedural states: Secondary | ICD-10-CM | POA: Diagnosis not present

## 2015-03-05 LAB — CBC
HCT: 36.3 % (ref 36.0–46.0)
Hemoglobin: 12.4 g/dL (ref 12.0–15.0)
MCH: 29.7 pg (ref 26.0–34.0)
MCHC: 34.2 g/dL (ref 30.0–36.0)
MCV: 87.1 fL (ref 78.0–100.0)
Platelets: 394 10*3/uL (ref 150–400)
RBC: 4.17 MIL/uL (ref 3.87–5.11)
RDW: 13.3 % (ref 11.5–15.5)
WBC: 9.5 10*3/uL (ref 4.0–10.5)

## 2015-03-05 LAB — I-STAT CHEM 8, ED
BUN: 10 mg/dL (ref 6–20)
Calcium, Ion: 1.08 mmol/L — ABNORMAL LOW (ref 1.12–1.23)
Chloride: 101 mmol/L (ref 101–111)
Creatinine, Ser: 0.6 mg/dL (ref 0.44–1.00)
Glucose, Bld: 134 mg/dL — ABNORMAL HIGH (ref 65–99)
HCT: 40 % (ref 36.0–46.0)
Hemoglobin: 13.6 g/dL (ref 12.0–15.0)
Potassium: 3.5 mmol/L (ref 3.5–5.1)
Sodium: 137 mmol/L (ref 135–145)
TCO2: 22 mmol/L (ref 0–100)

## 2015-03-05 NOTE — ED Notes (Signed)
Pt here for continued HA.  Pt has seen a neurologist for this and is waiting to get her CT scan done.  Pt has had this HA for 2 weeks.  Pt describes a "something is pulling and tight and hurts so bad".  Pt has neck and back pain with this.  Pt states that she has some sensitivity to light with this and some nausea.  Pt is alert and oriented, no neuro deficits.

## 2015-03-06 ENCOUNTER — Other Ambulatory Visit: Payer: Medicaid Other

## 2015-03-06 ENCOUNTER — Emergency Department (HOSPITAL_COMMUNITY)
Admission: EM | Admit: 2015-03-06 | Discharge: 2015-03-06 | Disposition: A | Discharge status: Home / Self Care | Payer: Medicaid Other | Attending: Emergency Medicine | Admitting: Emergency Medicine

## 2015-03-06 DIAGNOSIS — IMO0002 Reserved for concepts with insufficient information to code with codable children: Secondary | ICD-10-CM

## 2015-03-06 DIAGNOSIS — G43709 Chronic migraine without aura, not intractable, without status migrainosus: Secondary | ICD-10-CM

## 2015-03-06 MED ORDER — KETOROLAC TROMETHAMINE 30 MG/ML IJ SOLN
30.0000 mg | Freq: Once | INTRAMUSCULAR | Status: AC
  Administered 2015-03-06: 30 mg via INTRAVENOUS
  Filled 2015-03-06: qty 1

## 2015-03-06 MED ORDER — DIPHENHYDRAMINE HCL 50 MG/ML IJ SOLN
50.0000 mg | Freq: Once | INTRAMUSCULAR | Status: AC
  Administered 2015-03-06: 50 mg via INTRAVENOUS
  Filled 2015-03-06: qty 1

## 2015-03-06 MED ORDER — METOCLOPRAMIDE HCL 5 MG/ML IJ SOLN
10.0000 mg | Freq: Once | INTRAMUSCULAR | Status: AC
  Administered 2015-03-06: 10 mg via INTRAVENOUS
  Filled 2015-03-06: qty 2

## 2015-03-06 MED ORDER — METOCLOPRAMIDE HCL 10 MG PO TABS: 10.0000 mg | ORAL_TABLET | Freq: Three times a day (TID) | ORAL | Status: DC | PRN

## 2015-03-06 NOTE — ED Notes (Signed)
Dr. Oni at bedside. 

## 2015-03-06 NOTE — Discharge Instructions (Signed)
Recurrent Migraine Headache Ms. Dredge, take reglan if your normal headache medication does not work.  See your neurologist for MRI and spinal tap for further evaluation of your headaches.  If any symptoms worsen, come back to the ED immediately. Thank you. A migraine headache is very bad, throbbing pain on one or both sides of your head. Recurrent migraines keep coming back. Talk to your doctor about what things may bring on (trigger) your migraine headaches. HOME CARE  Only take medicines as told by your doctor.  Lie down in a dark, quiet room when you have a migraine.  Keep a journal to find out if certain things bring on migraine headaches. For example, write down:  What you eat and drink.  How much sleep you get.  Any change to your diet or medicines.  Lessen how much alcohol you drink.  Quit smoking if you smoke.  Get enough sleep.  Lessen any stress in your life.  Keep lights dim if bright lights bother you or make your migraines worse. GET HELP IF:  Medicine does not help your migraines.  Your pain keeps coming back.  You have a fever. GET HELP RIGHT AWAY IF:   Your migraine becomes really bad.  You have a stiff neck.  You have trouble seeing.  Your muscles are weak, or you lose muscle control.  You lose your balance or have trouble walking.  You feel like you will pass out (faint), or you pass out.  You have really bad symptoms that are different than your first symptoms. MAKE SURE YOU:   Understand these instructions.  Will watch your condition.  Will get help right away if you are not doing well or get worse.   This information is not intended to replace advice given to you by your health care provider. Make sure you discuss any questions you have with your health care provider.   Document Released: 11/05/2007 Document Revised: 01/31/2013 Document Reviewed: 10/03/2012 Elsevier Interactive Patient Education [employer].

## 2015-03-06 NOTE — ED Provider Notes (Signed)
CSN: [id number]     Arrival date & time 03/05/15  2238 History  By signing my name below, I, Altamease Oiler, attest that this documentation has been prepared under the direction and in the presence of [name], MD. Electronically Signed: Altamease Oiler, ED Scribe. 03/06/2015. 12:51 AM   Chief Complaint  Patient presents with  . Headache   The history is provided by the patient. No language interpreter was used.   Sarah Goodman is a 50 y.o. female with history of migraines and HTN who presents to the Emergency Department complaining of a constant, tight/pulling, 9/10 in severity, diffuse, headache with onset 3 weeks ago. The pain is worse with reading in bright light. Pt has been seen by Neurology and has a scheduled MRI in 2 days. The Nortriptyline that she was prescribed has provided insufficient pain relief at home. Pt states that she had an LP that showed increased pressure years ago but this pain is more severe than the headache that she had at that time. Associated symptoms include nausea, SOB, and mid back pain.  Pt denies new blurred vision, vomiting, numbness, and weakness.    Past Medical History  Diagnosis Date  . Diabetes mellitus   . Headache(784.0)     migraines  . GERD (gastroesophageal reflux disease)   . Hypertension     Patient denies, no BP med , d/c'd by MD  . Migraine   . Memory difficulty    Past Surgical History  Procedure Laterality Date  . Hysteroscopy w/d&c  12/18/2011    Procedure: DILATATION AND CURETTAGE /HYSTEROSCOPY;  Surgeon: Lahoma Crocker, MD;  Location: [facility];  Service: Gynecology;  Laterality: N/A;  . Colposcopy  12/18/2011    Procedure: COLPOSCOPY;  Surgeon: Lahoma Crocker, MD;  Location: [facility];  Service: Gynecology;  Laterality: N/A;  . Left heart catheterization with coronary angiogram N/A 05/26/2012    Procedure: LEFT HEART CATHETERIZATION WITH CORONARY ANGIOGRAM;  Surgeon: Sinclair Grooms, MD;  Location: [facility] CATH LAB;  Service:  Cardiovascular;  Laterality: N/A;   Family History  Problem Relation Age of Onset  . Diabetes Mother   . Hypertension Mother   . CAD Mother 66    Early onset CAD, with "blockage in artery and stent in heart"   Social History  Substance Use Topics  . Smoking status: Never Smoker   . Smokeless tobacco: None  . Alcohol Use: No   OB History    Gravida Para Term Preterm AB TAB SAB Ectopic Multiple Living   4 4        4      Review of Systems 10 Systems reviewed and all are negative for acute change except as noted in the HPI.   Allergies  Review of patient's allergies indicates no known allergies.  Home Medications   Prior to Admission medications   Medication Sig Start Date End Date Taking? Authorizing Provider  albuterol (PROVENTIL HFA;VENTOLIN HFA) 108 (90 BASE) MCG/ACT inhaler Inhale 2 puffs into the lungs 3 (three) times daily as needed for wheezing or shortness of breath.     Historical Provider, MD  exenatide (BYETTA) 10 MCG/0.04ML SOPN injection Inject 10 mcg into the skin 2 (two) times daily with a meal.    Historical Provider, MD  metFORMIN (GLUCOPHAGE) 850 MG tablet Take 1 tablet (850 mg total) by mouth 2 (two) times daily with a meal. 05/29/12   [name], NP  nortriptyline (PAMELOR) 50 MG capsule 2 tabs po qhs 02/27/15  [name], MD  NOVOLOG MIX 70/30 FLEXPEN (70-30) 100 UNIT/ML FlexPen INJECT 20 UNITS SUBCUTANEOUSLY BID 12/19/14   Historical Provider, MD  omeprazole (PRILOSEC) 20 MG capsule TK ONE C PO  QD 02/13/15   Historical Provider, MD  propranolol (INDERAL) 60 MG tablet Take 1 tablet (60 mg total) by mouth 2 (two) times daily. 02/28/15   [name], MD  SUMAtriptan (IMITREX) 25 MG tablet Take 1 tablet (25 mg total) by mouth every 2 (two) hours as needed for migraine. May repeat in 2 hours if headache persists or recurs. 02/27/15   [name], MD   BP 154/98 mmHg  Pulse 86  Temp(Src) 97.6 F (36.4 C) (Oral)  Resp 20  Ht 6\' 2"  (1.88 m)  Wt 347 lb (157.398 kg)   BMI 44.53 kg/m2  SpO2 98% Physical Exam  Constitutional: She is oriented to person, place, and time. She appears well-developed and well-nourished. No distress.  Obese female  HENT:  Head: Normocephalic and atraumatic.  Nose: Nose normal.  Mouth/Throat: Oropharynx is clear and moist. No oropharyngeal exudate.  Eyes: Conjunctivae and EOM are normal. Pupils are equal, round, and reactive to light. No scleral icterus.  Neck: Normal range of motion. Neck supple. No JVD present. No tracheal deviation present. No thyromegaly present.  Cardiovascular: Normal rate, regular rhythm and normal heart sounds.  Exam reveals no gallop and no friction rub.   No murmur heard. Pulmonary/Chest: Effort normal and breath sounds normal. No respiratory distress. She has no wheezes. She exhibits no tenderness.  Abdominal: Soft. Bowel sounds are normal. She exhibits no distension and no mass. There is no tenderness. There is no rebound and no guarding.  Musculoskeletal: Normal range of motion. She exhibits no edema or tenderness.  Lymphadenopathy:    She has no cervical adenopathy.  Neurological: She is alert and oriented to person, place, and time. No cranial nerve deficit. She exhibits normal muscle tone. Coordination normal.  Skin: Skin is warm and dry. No rash noted. No erythema. No pallor.  Nursing note and vitals reviewed.   ED Course  Procedures (including critical care time) DIAGNOSTIC STUDIES: Oxygen Saturation is 98% on RA,  normal by my interpretation.    COORDINATION OF CARE: 12:55 AM Discussed treatment plan which includes lab work, Benadryl, Toradol, Reglan with pt at bedside and pt agreed to plan.  2:29 AM I re-evaluated the patient and her headache has improved.    Labs Review Labs Reviewed  I-STAT CHEM 8, ED - Abnormal; Notable for the following:    Glucose, Bld 134 (*)    Calcium, Ion 1.08 (*)    All other components within normal limits  CBC    Imaging Review No results  found.  I personally reviewed and evaluated these lab results as a part of my medical decision-making.    EKG Interpretation None      MDM   Final diagnoses:  None     patient presents to the emergency department for chronic headache for the past 3 weeks. She has an MRI pending with neurology. 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 This is likely a recurrence. She was given Reglan, Benadryl, Toradol for treatment. Patient was strongly advised to follow-up with her neurologist for MRI and likely spinal tap. Will continue to reassess.    upon repeat evaluation, patient states her headache has significantly improved. We'll discharge with Reglan to take as needed for headaches. She is encouraged to see her neurologist, obtain MRI  and likely spinal tap for further evaluation. She demonstrated understanding. She appears well and in no acute distress, vital signs remain within her normal limits and she is safe for discharge.   I personally performed the services described in this documentation, which was scribed in my presence. The recorded information has been reviewed and is accurate.     [name], MD 03/06/15 [id number]

## 2015-03-06 NOTE — ED Notes (Signed)
MD at bedside. 

## 2015-03-08 ENCOUNTER — Ambulatory Visit
Admission: RE | Admit: 2015-03-08 | Discharge: 2015-03-08 | Disposition: A | Discharge status: Home / Self Care | Payer: Medicaid Other | Source: Ambulatory Visit | Attending: Neurology | Admitting: Neurology

## 2015-03-08 DIAGNOSIS — G43709 Chronic migraine without aura, not intractable, without status migrainosus: Secondary | ICD-10-CM

## 2015-03-08 DIAGNOSIS — Z6841 Body Mass Index (BMI) 40.0 and over, adult: Secondary | ICD-10-CM

## 2015-03-08 DIAGNOSIS — IMO0002 Reserved for concepts with insufficient information to code with codable children: Secondary | ICD-10-CM

## 2015-03-11 ENCOUNTER — Telehealth: Payer: Self-pay | Admitting: Neurology

## 2015-03-11 NOTE — Telephone Encounter (Addendum)
NKDA - lorazepam, per office protocol, placed up front for patient to pick up to help with MRI.  Left message letting her know it was ready.

## 2015-03-11 NOTE — Telephone Encounter (Signed)
Pt called said she could not complete CT scan on 03/08/15. She became claustrophobic and anxiety ridden. The tech said she could be prescribed medication to help her relax for scan.

## 2015-03-12 ENCOUNTER — Telehealth: Payer: Self-pay | Admitting: Neurology

## 2015-03-12 NOTE — Telephone Encounter (Signed)
Pt called sts her head is hurting worse since 03/06/15 (Wednesday). She said feels like fluid and pain is in top of head, back of neck, top of shoulder blades. Please call

## 2015-03-12 NOTE — Telephone Encounter (Signed)
Spoke to patient - states she never picked up the sumatriptan at the pharmacy - she will pick it up today and try it for her headache.  She will call back if the medication is not helpful.

## 2015-03-14 ENCOUNTER — Other Ambulatory Visit: Payer: Medicaid Other

## 2015-03-17 ENCOUNTER — Emergency Department (HOSPITAL_COMMUNITY): Payer: Medicaid Other

## 2015-03-17 ENCOUNTER — Inpatient Hospital Stay: Admission: RE | Admit: 2015-03-17 | Payer: Medicaid Other | Source: Ambulatory Visit

## 2015-03-17 ENCOUNTER — Inpatient Hospital Stay (HOSPITAL_COMMUNITY): Payer: Medicaid Other

## 2015-03-17 ENCOUNTER — Encounter (HOSPITAL_COMMUNITY): Payer: Self-pay

## 2015-03-17 ENCOUNTER — Inpatient Hospital Stay (HOSPITAL_COMMUNITY)
Admission: EM | Admit: 2015-03-17 | Discharge: 2015-03-18 | DRG: 313 | Disposition: A | Discharge status: Home / Self Care | Payer: Medicaid Other | Attending: Internal Medicine | Admitting: Internal Medicine

## 2015-03-17 DIAGNOSIS — IMO0002 Reserved for concepts with insufficient information to code with codable children: Secondary | ICD-10-CM | POA: Diagnosis present

## 2015-03-17 DIAGNOSIS — K219 Gastro-esophageal reflux disease without esophagitis: Secondary | ICD-10-CM | POA: Diagnosis present

## 2015-03-17 DIAGNOSIS — R1110 Vomiting, unspecified: Secondary | ICD-10-CM

## 2015-03-17 DIAGNOSIS — R0682 Tachypnea, not elsewhere classified: Secondary | ICD-10-CM | POA: Diagnosis present

## 2015-03-17 DIAGNOSIS — L509 Urticaria, unspecified: Secondary | ICD-10-CM | POA: Diagnosis present

## 2015-03-17 DIAGNOSIS — T380X5A Adverse effect of glucocorticoids and synthetic analogues, initial encounter: Secondary | ICD-10-CM | POA: Diagnosis not present

## 2015-03-17 DIAGNOSIS — R21 Rash and other nonspecific skin eruption: Secondary | ICD-10-CM | POA: Diagnosis present

## 2015-03-17 DIAGNOSIS — Y92239 Unspecified place in hospital as the place of occurrence of the external cause: Secondary | ICD-10-CM | POA: Diagnosis not present

## 2015-03-17 DIAGNOSIS — R000 Tachycardia, unspecified: Secondary | ICD-10-CM | POA: Diagnosis present

## 2015-03-17 DIAGNOSIS — E1165 Type 2 diabetes mellitus with hyperglycemia: Secondary | ICD-10-CM | POA: Diagnosis present

## 2015-03-17 DIAGNOSIS — T7840XA Allergy, unspecified, initial encounter: Secondary | ICD-10-CM | POA: Diagnosis present

## 2015-03-17 DIAGNOSIS — Z6841 Body Mass Index (BMI) 40.0 and over, adult: Secondary | ICD-10-CM | POA: Diagnosis not present

## 2015-03-17 DIAGNOSIS — R7989 Other specified abnormal findings of blood chemistry: Secondary | ICD-10-CM | POA: Diagnosis present

## 2015-03-17 DIAGNOSIS — R112 Nausea with vomiting, unspecified: Secondary | ICD-10-CM | POA: Diagnosis present

## 2015-03-17 DIAGNOSIS — J45909 Unspecified asthma, uncomplicated: Secondary | ICD-10-CM | POA: Diagnosis present

## 2015-03-17 DIAGNOSIS — Z794 Long term (current) use of insulin: Secondary | ICD-10-CM | POA: Diagnosis not present

## 2015-03-17 DIAGNOSIS — R0789 Other chest pain: Secondary | ICD-10-CM | POA: Insufficient documentation

## 2015-03-17 DIAGNOSIS — R42 Dizziness and giddiness: Secondary | ICD-10-CM | POA: Diagnosis present

## 2015-03-17 DIAGNOSIS — E118 Type 2 diabetes mellitus with unspecified complications: Secondary | ICD-10-CM | POA: Diagnosis not present

## 2015-03-17 DIAGNOSIS — Z8249 Family history of ischemic heart disease and other diseases of the circulatory system: Secondary | ICD-10-CM | POA: Diagnosis not present

## 2015-03-17 DIAGNOSIS — R0600 Dyspnea, unspecified: Secondary | ICD-10-CM

## 2015-03-17 DIAGNOSIS — R791 Abnormal coagulation profile: Secondary | ICD-10-CM | POA: Diagnosis not present

## 2015-03-17 DIAGNOSIS — G43709 Chronic migraine without aura, not intractable, without status migrainosus: Secondary | ICD-10-CM | POA: Diagnosis present

## 2015-03-17 DIAGNOSIS — I10 Essential (primary) hypertension: Secondary | ICD-10-CM | POA: Diagnosis present

## 2015-03-17 DIAGNOSIS — R079 Chest pain, unspecified: Secondary | ICD-10-CM | POA: Diagnosis present

## 2015-03-17 DIAGNOSIS — G43909 Migraine, unspecified, not intractable, without status migrainosus: Secondary | ICD-10-CM | POA: Diagnosis present

## 2015-03-17 LAB — BASIC METABOLIC PANEL
Anion gap: 12 (ref 5–15)
BUN: 12 mg/dL (ref 6–20)
CO2: 22 mmol/L (ref 22–32)
Calcium: 8.8 mg/dL — ABNORMAL LOW (ref 8.9–10.3)
Chloride: 97 mmol/L — ABNORMAL LOW (ref 101–111)
Creatinine, Ser: 0.8 mg/dL (ref 0.44–1.00)
GFR calc Af Amer: >60 mL/min (ref >60)
GFR calc non Af Amer: >60 mL/min (ref >60)
Glucose, Bld: 234 mg/dL — ABNORMAL HIGH (ref 65–99)
Potassium: 4.3 mmol/L (ref 3.5–5.1)
Sodium: 131 mmol/L — ABNORMAL LOW (ref 135–145)

## 2015-03-17 LAB — HEPATIC FUNCTION PANEL
ALT: 22 U/L (ref 14–54)
AST: 19 U/L (ref 15–41)
Albumin: 2.9 g/dL — ABNORMAL LOW (ref 3.5–5.0)
Alkaline Phosphatase: 64 U/L (ref 38–126)
Bilirubin, Direct: 0.3 mg/dL (ref 0.1–0.5)
Indirect Bilirubin: 1.1 mg/dL — ABNORMAL HIGH (ref 0.3–0.9)
Total Bilirubin: 1.4 mg/dL — ABNORMAL HIGH (ref 0.3–1.2)
Total Protein: 6.5 g/dL (ref 6.5–8.1)

## 2015-03-17 LAB — CBC
HCT: 46.2 % — ABNORMAL HIGH (ref 36.0–46.0)
Hemoglobin: 16.2 g/dL — ABNORMAL HIGH (ref 12.0–15.0)
MCH: 30.4 pg (ref 26.0–34.0)
MCHC: 35.1 g/dL (ref 30.0–36.0)
MCV: 86.7 fL (ref 78.0–100.0)
Platelets: 425 10*3/uL — ABNORMAL HIGH (ref 150–400)
RBC: 5.33 MIL/uL — ABNORMAL HIGH (ref 3.87–5.11)
RDW: 13.3 % (ref 11.5–15.5)
WBC: 8.8 10*3/uL (ref 4.0–10.5)

## 2015-03-17 LAB — I-STAT CG4 LACTIC ACID, ED: Lactic Acid, Venous: 2.73 mmol/L (ref 0.5–2.0)

## 2015-03-17 LAB — D-DIMER, QUANTITATIVE: D-Dimer, Quant: >20 ug/mL-FEU — ABNORMAL HIGH (ref 0.00–0.50)

## 2015-03-17 LAB — LIPASE, BLOOD: Lipase: 17 U/L (ref 11–51)

## 2015-03-17 LAB — I-STAT TROPONIN, ED: Troponin i, poc: 0 ng/mL (ref 0.00–0.08)

## 2015-03-17 LAB — CBG MONITORING, ED: Glucose-Capillary: 195 mg/dL — ABNORMAL HIGH (ref 65–99)

## 2015-03-17 MED ORDER — HEPARIN BOLUS VIA INFUSION
5000.0000 [IU] | Freq: Once | INTRAVENOUS | Status: AC
  Administered 2015-03-17: 5000 [IU] via INTRAVENOUS
  Filled 2015-03-17: qty 5000

## 2015-03-17 MED ORDER — ONDANSETRON HCL 4 MG/2ML IJ SOLN: 4.0000 mg | Freq: Four times a day (QID) | INTRAMUSCULAR | Status: DC | PRN

## 2015-03-17 MED ORDER — DIPHENHYDRAMINE HCL 50 MG/ML IJ SOLN
50.0000 mg | Freq: Once | INTRAMUSCULAR | Status: AC
  Administered 2015-03-17: 50 mg via INTRAVENOUS
  Filled 2015-03-17: qty 1

## 2015-03-17 MED ORDER — PROPRANOLOL HCL 60 MG PO TABS
60.0000 mg | ORAL_TABLET | Freq: Two times a day (BID) | ORAL | Status: DC
  Administered 2015-03-18 (×2): 60 mg via ORAL
  Filled 2015-03-17 (×3): qty 1

## 2015-03-17 MED ORDER — MORPHINE SULFATE (PF) 4 MG/ML IV SOLN
4.0000 mg | Freq: Once | INTRAVENOUS | Status: AC
  Administered 2015-03-17: 4 mg via INTRAVENOUS
  Filled 2015-03-17: qty 1

## 2015-03-17 MED ORDER — ALBUTEROL SULFATE (2.5 MG/3ML) 0.083% IN NEBU: 2.5000 mg | INHALATION_SOLUTION | Freq: Three times a day (TID) | RESPIRATORY_TRACT | Status: DC | PRN

## 2015-03-17 MED ORDER — ALBUTEROL SULFATE HFA 108 (90 BASE) MCG/ACT IN AERS: 2.0000 | INHALATION_SPRAY | Freq: Three times a day (TID) | RESPIRATORY_TRACT | Status: DC | PRN

## 2015-03-17 MED ORDER — ASPIRIN 81 MG PO CHEW
324.0000 mg | CHEWABLE_TABLET | Freq: Once | ORAL | Status: AC
  Administered 2015-03-17: 324 mg via ORAL
  Filled 2015-03-17: qty 4

## 2015-03-17 MED ORDER — DIPHENHYDRAMINE HCL 50 MG/ML IJ SOLN
25.0000 mg | Freq: Four times a day (QID) | INTRAMUSCULAR | Status: DC | PRN
  Administered 2015-03-18: 25 mg via INTRAVENOUS
  Filled 2015-03-17: qty 1

## 2015-03-17 MED ORDER — HEPARIN (PORCINE) IN NACL 100-0.45 UNIT/ML-% IJ SOLN
1900.0000 [IU]/h | INTRAMUSCULAR | Status: DC
  Administered 2015-03-17: 1900 [IU]/h via INTRAVENOUS
  Filled 2015-03-17 (×2): qty 250

## 2015-03-17 MED ORDER — INSULIN ASPART 100 UNIT/ML ~~LOC~~ SOLN
0.0000 [IU] | Freq: Three times a day (TID) | SUBCUTANEOUS | Status: DC
Start: 2015-03-18 — End: 2015-03-18
  Administered 2015-03-18: 2 [IU] via SUBCUTANEOUS
  Administered 2015-03-18 (×2): 5 [IU] via SUBCUTANEOUS

## 2015-03-17 MED ORDER — METHYLPREDNISOLONE SODIUM SUCC 125 MG IJ SOLR
125.0000 mg | Freq: Once | INTRAMUSCULAR | Status: AC
  Administered 2015-03-17: 125 mg via INTRAVENOUS
  Filled 2015-03-17: qty 2

## 2015-03-17 MED ORDER — PANTOPRAZOLE SODIUM 40 MG IV SOLR
40.0000 mg | Freq: Every day | INTRAVENOUS | Status: DC
  Administered 2015-03-18: 40 mg via INTRAVENOUS
  Filled 2015-03-17: qty 40

## 2015-03-17 MED ORDER — ENOXAPARIN SODIUM 40 MG/0.4ML ~~LOC~~ SOLN
40.0000 mg | Freq: Every day | SUBCUTANEOUS | Status: DC
  Administered 2015-03-18: 40 mg via SUBCUTANEOUS
  Filled 2015-03-17 (×2): qty 0.4

## 2015-03-17 MED ORDER — SODIUM CHLORIDE 0.9 % IV SOLN
INTRAVENOUS | Status: DC
  Administered 2015-03-18 (×2): via INTRAVENOUS

## 2015-03-17 MED ORDER — IOHEXOL 350 MG/ML SOLN
100.0000 mL | Freq: Once | INTRAVENOUS | Status: AC | PRN
  Administered 2015-03-17: 100 mL via INTRAVENOUS

## 2015-03-17 MED ORDER — INSULIN GLARGINE 100 UNIT/ML ~~LOC~~ SOLN
24.0000 [IU] | Freq: Every day | SUBCUTANEOUS | Status: DC
  Administered 2015-03-18: 24 [IU] via SUBCUTANEOUS
  Filled 2015-03-17 (×3): qty 0.24

## 2015-03-17 MED ORDER — SODIUM CHLORIDE 0.9 % IV BOLUS (SEPSIS)
1000.0000 mL | Freq: Once | INTRAVENOUS | Status: AC
  Administered 2015-03-17: 1000 mL via INTRAVENOUS

## 2015-03-17 NOTE — Progress Notes (Signed)
ANTICOAGULATION CONSULT NOTE - Initial Consult  Pharmacy Consult for heparin Indication: DVT (r/o)  No Known Allergies  Patient Measurements: Height: 6\' 3"  (190.5 cm) Weight: (!) 344 lb (156.037 kg) IBW/kg (Calculated) : 80 Heparin Dosing Weight: 116  Vital Signs: Temp: 98.1 F (36.7 C) (02/05 1630) Temp Source: Oral (02/05 1630) BP: 109/60 mmHg (02/05 1800) Pulse Rate: 119 (02/05 1800)  Labs:  Recent Labs  03/17/15 1639  HGB 16.2*  HCT 46.2*  PLT 425*  CREATININE 0.80   Assessment: 14 yoF admitted with tachypnea, tachycardia and found to have elevated D-Dimer Pharmacy consulted to start heparin while PE being r/o. CT px. No anticoagulation PTA.  Goal of Therapy:  Heparin level 0.3-0.7 units/ml Monitor platelets by anticoagulation protocol: Yes   Plan:  1. Give 5000 units bolus x 1 2. Start heparin infusion at 1900 units/hr 3. Check anti-Xa level in 6 hours and daily while on heparin 4. Continue to monitor H&H and platelets   Vincenza Hews, PharmD, BCPS 03/17/2015, 6:43 PM Pager: [phone]

## 2015-03-17 NOTE — ED Notes (Signed)
Admitting MD at bedside.

## 2015-03-17 NOTE — ED Notes (Signed)
Wes, RR nurse, cleared pt for bed assignment

## 2015-03-17 NOTE — ED Notes (Signed)
Pt reports improvement of itching after benadryl, hives appear to be improving. Pt denies any chest pain, pressure, or shortness of breath

## 2015-03-17 NOTE — H&P (Signed)
History and Physical  Sarah Goodman [id number] DOB: January 04, 1966 DOA: 03/17/2015  PCP: Benito Mccreedy, MD   Chief Complaint: nausea and vomiting   History of Present Illness:  Patient is a 50 year old female with history of DMII, HTN, asthma who came with cc of nausea and vomiting ( non bloody, bilious, #2) that started yesterday morning with no abdominal pain,diarrhea or constipation. But she has complained of chest pain ( as someone is sitting on my chest) with mild dyspnea with exertion for the past two days. No cough, wheezing, fever or chills. She also had hives starting Sat morning with mild swelling in her lower lip. Now she feels vertigo and "high" ( she took Benadryl and steroids earlier). No sensory/motor deficits complaints.    Review of Systems:  CONSTITUTIONAL:  No night sweats.  No fatigue, malaise, lethargy.  No fever or chills. Eyes:  No visual changes.  No eye pain.  No eye discharge.   ENT:    No epistaxis.  No sinus pain.  No sore throat.  No ear pain.  No congestion. RESPIRATORY:  No cough.  No wheeze.  No hemoptysis.  No shortness of breath. CARDIOVASCULAR:  +chest pains.  No palpitations. GASTROINTESTINAL:  No abdominal pain.  +nausea +vomiting.  No diarrhea or constipation.  No hematemesis.  No hematochezia.  No melena. GENITOURINARY:  No urgency.  No frequency.  No dysuria.  No hematuria.  No obstructive symptoms.  No discharge.  No pain.  No significant abnormal bleeding. MUSCULOSKELETAL:  No musculoskeletal pain.  No joint swelling.  No arthritis. NEUROLOGICAL:  No confusion.  No weakness. No headache. No seizure. PSYCHIATRIC:  No depression. No anxiety. No suicidal ideation. SKIN:  +rashes.  No lesions.  No wounds. ENDOCRINE:  No unexplained weight loss.  No polydipsia.  No polyuria.  No polyphagia. HEMATOLOGIC:  No anemia.  No purpura.  No petechiae.  No bleeding.  ALLERGIC AND IMMUNOLOGIC:  N+o pruritus.  +swelling Other:  Past Medical and  Surgical History:   Past Medical History  Diagnosis Date  . Diabetes mellitus   . Headache(784.0)     migraines  . GERD (gastroesophageal reflux disease)   . Hypertension     Patient denies, no BP med , d/c'd by MD  . Migraine   . Memory difficulty    Past Surgical History  Procedure Laterality Date  . Hysteroscopy w/d&c  12/18/2011    Procedure: DILATATION AND CURETTAGE /HYSTEROSCOPY;  Surgeon: Lahoma Crocker, MD;  Location: [facility];  Service: Gynecology;  Laterality: N/A;  . Colposcopy  12/18/2011    Procedure: COLPOSCOPY;  Surgeon: Lahoma Crocker, MD;  Location: [facility];  Service: Gynecology;  Laterality: N/A;  . Left heart catheterization with coronary angiogram N/A 05/26/2012    Procedure: LEFT HEART CATHETERIZATION WITH CORONARY ANGIOGRAM;  Surgeon: Sinclair Grooms, MD;  Location: [facility] C[facility] LAB;  Service: Cardiovascular;  Laterality: N/A;    Social History:   reports that she has never smoked. She does not have any smokeless tobacco history on file. She reports that she does not drink alcohol or use illicit drugs.   No Known Allergies  Family History  Problem Relation Age of Onset  . Diabetes Mother   . Hypertension Mother   . CAD Mother 22    Early onset CAD, with "blockage in artery and stent in heart"      Prior to Admission medications   Medication Sig Start Date End Date Taking? Authorizing Provider  albuterol (PROVENTIL HFA;VENTOLIN  HFA) 108 (90 BASE) MCG/ACT inhaler Inhale 2 puffs into the lungs 3 (three) times daily as needed for wheezing or shortness of breath.    Yes Historical Provider, MD  exenatide (BYETTA) 10 MCG/0.04ML SOPN injection Inject 10 mcg into the skin 2 (two) times daily with a meal.   Yes Historical Provider, MD  metFORMIN (GLUCOPHAGE) 850 MG tablet Take 1 tablet (850 mg total) by mouth 2 (two) times daily with a meal. 05/29/12  Yes [name], NP  metoCLOPramide (REGLAN) 10 MG tablet Take 1 tablet (10 mg total) by mouth every 8  (eight) hours as needed (headache). 03/06/15  Yes [name], MD  nortriptyline (PAMELOR) 50 MG capsule 2 tabs po qhs 02/27/15  Yes [name], MD  NOVOLOG MIX 70/30 FLEXPEN (70-30) 100 UNIT/ML FlexPen INJECT 20 UNITS SUBCUTANEOUSLY BID 12/19/14  Yes Historical Provider, MD  omeprazole (PRILOSEC) 20 MG capsule TK ONE C PO  QD 02/13/15  Yes Historical Provider, MD  propranolol (INDERAL) 60 MG tablet Take 1 tablet (60 mg total) by mouth 2 (two) times daily. 02/28/15  Yes [name], MD  SUMAtriptan (IMITREX) 25 MG tablet Take 1 tablet (25 mg total) by mouth every 2 (two) hours as needed for migraine. May repeat in 2 hours if headache persists or recurs. 02/27/15  Yes [name], MD    Physical Exam: BP 114/73 mmHg  Pulse 120  Temp(Src) 98.1 F (36.7 C) (Oral)  Resp 34  Ht 6\' 3"  (1.905 m)  Wt 156.037 kg (344 lb)  BMI 43.00 kg/m2  SpO2 100%  GENERAL : Well developed, well nourished, alert and cooperative, and appears to be in no acute distress. HEAD: normocephalic. EYES: PERRL, EOMI.  NOSE: No nasal discharge. THROAT: Oral cavity and pharynx normal.  NECK: Neck supple CARDIAC: Normal S1 and S2. No S3, S4 or murmurs. Rhythm is regular. There is no peripheral edema, cyanosis or pallor. LUNGS: Clear to auscultation  ABDOMEN: Positive bowel sounds. Soft, nondistended, nontender. No guarding or rebound. No masses. NEUROLOGICAL: The mental examination revealed the patient was oriented to person, place, and time.CN II-XII intact.  SKIN: hives on thighs /chest.  PSYCHIATRIC:  The patient was able to demonstrate good judgement and reason, without hallucinations, abnormal affect or abnormal behaviors during the examination. Patient is not suicidal.          Labs on Admission:  Reviewed.   Radiological Exams on Admission: Dg Chest 2 View  03/17/2015  CLINICAL DATA:  Initial evaluation for acute chest pain. EXAM: CHEST  2 VIEW COMPARISON:  Prior study from 05/23/2012. FINDINGS: Cardiac and mediastinal  silhouettes are stable in size and contour, and remain within normal limits. Mild elevation the right hemidiaphragm, stable. No focal infiltrate, pulmonary edema, or pleural effusion. No pneumothorax. No acute osseus abnormality. IMPRESSION: 1. Mild chronic elevation of the right hemidiaphragm, stable. 2. No other active cardiopulmonary disease. Electronically Signed   By: Jeannine Boga M.D.   On: 03/17/2015 17:11   Ct Angio Chest Pe W/cm &/or Wo Cm  03/17/2015  CLINICAL DATA:  Pt reports nausea and vomiting that began this morning along with dizziness. Rash. Pt reports she has been unable to hold anything down today. Central chest pressure. EXAM: CT ANGIOGRAPHY CHEST WITH CONTRAST TECHNIQUE: Multidetector CT imaging of the chest was performed using the standard protocol during bolus administration of intravenous contrast. Multiplanar CT image reconstructions and MIPs were obtained to evaluate the vascular anatomy. CONTRAST:  126mL OMNIPAQUE IOHEXOL 350 MG/ML SOLN COMPARISON:  Chest x-ray 03/17/2015 and chest CT 05/23/2012 FINDINGS: Heart: Heart size is normal. No imaged pericardial effusion or significant coronary artery calcifications. Vascular structures: Pulmonary arteries are well opacified. There is no evidence for acute pulmonary embolus. Thoracic aorta is not aneurysmal. Mediastinum/thyroid: The visualized portion of the thyroid gland has a normal appearance. Small calcified mediastinal lymph nodes are identified, measuring no more than 1.5 cm in diameter. Findings are consistent with chronic granulomatous disease. No suspicious noncalcified adenopathy. Lungs/Airways: There are mild airspace filling opacities in the lungs bilaterally, predominantly at the bases consistent with small airways disease. No focal consolidations or pleural effusions. Upper abdomen: Unremarkable. Chest wall/osseous structures: Unremarkable. Review of the MIP images confirms the above findings. IMPRESSION: 1. Technically  adequate exam showing no pulmonary embolus. 2. Calcified mediastinal lymph nodes are consistent chronic granulomatous disease. 3. Mild parenchymal changes consistent with small airways disease, nonspecific in appearance. Electronically Signed   By: Nolon Nations M.D.   On: 03/17/2015 19:38    EKG:  Independently reviewed. Sinus tachycardia.   Assessment/Plan  Chest pain: Will need to r/o ACS due to multiple CAD risk factors Will check serial trops/EKGs  N/V/vertigo:  Could be related to above  Will check AXR Lipase and liver enzymes WNL No abd pain Zofran prn Will check UDS/UA  Elevated D dimers: Unclear etiology, CTPE was neg, will check LE [place] for DVT. ACS may cause elevated DD. Stroke may cause it too but no focal symptoms ( old tingling in left big toe due to peripheral neuropathy). Need to think about anaphylaxis ( esp with hives/angioedema) but BP is normal.    DVT prophylaxis: Baxter enoxaparin GI prophylaxis:PPI Code Status: Full      Gennaro Africa M.D [facility]

## 2015-03-17 NOTE — ED Notes (Signed)
Attempted to call report

## 2015-03-17 NOTE — ED Provider Notes (Signed)
CSN: [id number]     Arrival date & time 03/17/15  1622 History   First MD Initiated Contact with Patient 03/17/15 1623     Chief Complaint  Patient presents with  . Emesis     (Consider location/radiation/quality/duration/timing/severity/associated sxs/prior Treatment) HPI Comments: 50 year old female with history of diabetes, high blood pressure, reflux, obesity presents with nausea vomiting lightheadedness and chest pressure. This morning patient had 2 episodes of nonbloody vomiting and then had constant chest pressure since. Nonradiating. No history of chest pain or cardiac disease known. Patient denies classic blood clot risk factors. No sick contacts no recent travel. Nothing specifically improves or worsens her symptoms. No recent exertional symptoms.  Patient is a 50 y.o. female presenting with vomiting. The history is provided by the patient.  Emesis Associated symptoms: no abdominal pain, no chills and no headaches     Past Medical History  Diagnosis Date  . Diabetes mellitus   . Headache(784.0)     migraines  . GERD (gastroesophageal reflux disease)   . Hypertension     Patient denies, no BP med , d/c'd by MD  . Migraine   . Memory difficulty    Past Surgical History  Procedure Laterality Date  . Hysteroscopy w/d&c  12/18/2011    Procedure: DILATATION AND CURETTAGE /HYSTEROSCOPY;  Surgeon: Lahoma Crocker, MD;  Location: [facility];  Service: Gynecology;  Laterality: N/A;  . Colposcopy  12/18/2011    Procedure: COLPOSCOPY;  Surgeon: Lahoma Crocker, MD;  Location: [facility];  Service: Gynecology;  Laterality: N/A;  . Left heart catheterization with coronary angiogram N/A 05/26/2012    Procedure: LEFT HEART CATHETERIZATION WITH CORONARY ANGIOGRAM;  Surgeon: Sinclair Grooms, MD;  Location: [facility] CATH LAB;  Service: Cardiovascular;  Laterality: N/A;   Family History  Problem Relation Age of Onset  . Diabetes Mother   . Hypertension Mother   . CAD Mother 77    Early onset  CAD, with "blockage in artery and stent in heart"   Social History  Substance Use Topics  . Smoking status: Never Smoker   . Smokeless tobacco: None  . Alcohol Use: No   OB History    Gravida Para Term Preterm AB TAB SAB Ectopic Multiple Living   4 4        4      Review of Systems  Constitutional: Positive for appetite change. Negative for fever and chills.  HENT: Negative for congestion.   Eyes: Negative for visual disturbance.  Respiratory: Negative for shortness of breath.   Cardiovascular: Positive for chest pain. Negative for leg swelling.  Gastrointestinal: Positive for nausea and vomiting. Negative for abdominal pain.  Genitourinary: Negative for dysuria and flank pain.  Musculoskeletal: Negative for back pain, neck pain and neck stiffness.  Skin: Negative for rash.  Neurological: Positive for light-headedness. Negative for headaches.      Allergies  Review of patient's allergies indicates no known allergies.  Home Medications   Prior to Admission medications   Medication Sig Start Date End Date Taking? Authorizing Provider  albuterol (PROVENTIL HFA;VENTOLIN HFA) 108 (90 BASE) MCG/ACT inhaler Inhale 2 puffs into the lungs 3 (three) times daily as needed for wheezing or shortness of breath.    Yes Historical Provider, MD  exenatide (BYETTA) 10 MCG/0.04ML SOPN injection Inject 10 mcg into the skin 2 (two) times daily with a meal.   Yes Historical Provider, MD  metFORMIN (GLUCOPHAGE) 850 MG tablet Take 1 tablet (850 mg total) by mouth 2 (two) times daily  with a meal. 05/29/12  Yes [name], NP  metoCLOPramide (REGLAN) 10 MG tablet Take 1 tablet (10 mg total) by mouth every 8 (eight) hours as needed (headache). 03/06/15  Yes [name], MD  nortriptyline (PAMELOR) 50 MG capsule 2 tabs po qhs 02/27/15  Yes [name], MD  NOVOLOG MIX 70/30 FLEXPEN (70-30) 100 UNIT/ML FlexPen INJECT 20 UNITS SUBCUTANEOUSLY BID 12/19/14  Yes Historical Provider, MD  omeprazole (PRILOSEC)  20 MG capsule TK ONE C PO  QD 02/13/15  Yes Historical Provider, MD  propranolol (INDERAL) 60 MG tablet Take 1 tablet (60 mg total) by mouth 2 (two) times daily. 02/28/15  Yes [name], MD  SUMAtriptan (IMITREX) 25 MG tablet Take 1 tablet (25 mg total) by mouth every 2 (two) hours as needed for migraine. May repeat in 2 hours if headache persists or recurs. 02/27/15  Yes [name], MD   BP 114/73 mmHg  Pulse 120  Temp(Src) 98.1 F (36.7 C) (Oral)  Resp 34  Ht 6\' 3"  (1.905 m)  Wt 344 lb (156.037 kg)  BMI 43.00 kg/m2  SpO2 100% Physical Exam  Constitutional: She is oriented to person, place, and time. She appears well-developed and well-nourished. No distress.  HENT:  Head: Normocephalic and atraumatic.   mild dry mucous membranes no angioedema  Eyes: Conjunctivae are normal. Right eye exhibits no discharge. Left eye exhibits no discharge.  Neck: Normal range of motion. Neck supple. No tracheal deviation present.  Cardiovascular: Regular rhythm.  Tachycardia present.   Pulmonary/Chest: Effort normal and breath sounds normal.  Abdominal: Soft. She exhibits no distension. There is no tenderness. There is no guarding.  Musculoskeletal: She exhibits edema. Tenderness: mild bilateral lower extremities.  Neurological: She is alert and oriented to person, place, and time.  Skin: Skin is warm. Rash (hives right posterior thigh no signs of abscess or infection) noted.  Psychiatric: She has a normal mood and affect.  Nursing note and vitals reviewed.   ED Course  Procedures (including critical care time) Labs Review Labs Reviewed  BASIC METABOLIC PANEL - Abnormal; Notable for the following:    Sodium 131 (*)    Chloride 97 (*)    Glucose, Bld 234 (*)    Calcium 8.8 (*)    All other components within normal limits  CBC - Abnormal; Notable for the following:    RBC 5.33 (*)    Hemoglobin 16.2 (*)    HCT 46.2 (*)    Platelets 425 (*)    All other components within normal limits  D-DIMER,  QUANTITATIVE (NOT AT [facility]) - Abnormal; Notable for the following:    D-Dimer, Quant >20.00 (*)    All other components within normal limits  HEPATIC FUNCTION PANEL - Abnormal; Notable for the following:    Albumin 2.9 (*)    Total Bilirubin 1.4 (*)    Indirect Bilirubin 1.1 (*)    All other components within normal limits  I-STAT CG4 LACTIC ACID, ED - Abnormal; Notable for the following:    Lactic Acid, Venous 2.73 (*)    All other components within normal limits  LIPASE, BLOOD  URINALYSIS, ROUTINE W REFLEX MICROSCOPIC (NOT AT [facility])  URINE RAPID DRUG SCREEN, HOSP PERFORMED  I-STAT TROPOININ, ED    Imaging Review Dg Chest 2 View  03/17/2015  CLINICAL DATA:  Initial evaluation for acute chest pain. EXAM: CHEST  2 VIEW COMPARISON:  Prior study from 05/23/2012. FINDINGS: Cardiac and mediastinal silhouettes are stable in size and contour, and  remain within normal limits. Mild elevation the right hemidiaphragm, stable. No focal infiltrate, pulmonary edema, or pleural effusion. No pneumothorax. No acute osseus abnormality. IMPRESSION: 1. Mild chronic elevation of the right hemidiaphragm, stable. 2. No other active cardiopulmonary disease. Electronically Signed   By: Jeannine Boga M.D.   On: 03/17/2015 17:11   Ct Angio Chest Pe W/cm &/or Wo Cm  03/17/2015  CLINICAL DATA:  Pt reports nausea and vomiting that began this morning along with dizziness. Rash. Pt reports she has been unable to hold anything down today. Central chest pressure. EXAM: CT ANGIOGRAPHY CHEST WITH CONTRAST TECHNIQUE: Multidetector CT imaging of the chest was performed using the standard protocol during bolus administration of intravenous contrast. Multiplanar CT image reconstructions and MIPs were obtained to evaluate the vascular anatomy. CONTRAST:  163mL OMNIPAQUE IOHEXOL 350 MG/ML SOLN COMPARISON:  Chest x-ray 03/17/2015 and chest CT 05/23/2012 FINDINGS: Heart: Heart size is normal. No imaged pericardial effusion or  significant coronary artery calcifications. Vascular structures: Pulmonary arteries are well opacified. There is no evidence for acute pulmonary embolus. Thoracic aorta is not aneurysmal. Mediastinum/thyroid: The visualized portion of the thyroid gland has a normal appearance. Small calcified mediastinal lymph nodes are identified, measuring no more than 1.5 cm in diameter. Findings are consistent with chronic granulomatous disease. No suspicious noncalcified adenopathy. Lungs/Airways: There are mild airspace filling opacities in the lungs bilaterally, predominantly at the bases consistent with small airways disease. No focal consolidations or pleural effusions. Upper abdomen: Unremarkable. Chest wall/osseous structures: Unremarkable. Review of the MIP images confirms the above findings. IMPRESSION: 1. Technically adequate exam showing no pulmonary embolus. 2. Calcified mediastinal lymph nodes are consistent chronic granulomatous disease. 3. Mild parenchymal changes consistent with small airways disease, nonspecific in appearance. Electronically Signed   By: Nolon Nations M.D.   On: 03/17/2015 19:38   I have personally reviewed and evaluated these images and lab results as part of my medical decision-making.   EKG Interpretation   Date/Time:  Sunday March 17 2015 16:29:08 EST Ventricular Rate:  119 PR Interval:  134 QRS Duration: 75 QT Interval:  298 QTC Calculation: 419 R Axis:   51 Text Interpretation:  Sinus tachycardia Consider right atrial enlargement  Flattening aVL Confirmed by Laurelyn Terrero  MD, Dayani Winbush ([id number]) on 03/17/2015 4:39:29  PM      MDM   Final diagnoses:  Hives  Chest pressure  Dyspnea  Sinus tachycardia ([place])   Patient presents with chest pressure vomiting and lightheadedness. Patient does have multiple cardiac risk factors. Patient is tachycardic possibly from dehydration with vomiting versus other pathology such as pulmonary embolism. Patient is low risk PE d-dimer sent.  Cardiac screen and abdominal labs sent. No abdominal pain on exam no known gallbladder history.  D-dimer significantly positive, patient persistently tachycardic and tachypnea. Concern for pulmonary embolism. Heparin drip ordered CT angina ordered. Discussed with CT tech to come and get the patient. Patient persistently tachycardic on exam. CT scan fortunately no blood clot. Patient symptoms improved on reassessment. On repeat evaluation more detail obtained regarding mild itchy rash on her back, patient does have hives in the right posterior thigh. Patient is not on ACE inhibitor. Plan for steroids and Benadryl. Patient needs continued workup in the hospital. The patients results and plan were reviewed and discussed.   Any x-rays performed were independently reviewed by myself.   Differential diagnosis were considered with the presenting HPI.  Medications  sodium chloride 0.9 % bolus 1,000 mL (not administered)  methylPREDNISolone sodium succinate (SOLU-MEDROL)  125 mg/2 mL injection 125 mg (not administered)  diphenhydrAMINE (BENADRYL) injection 50 mg (not administered)  aspirin chewable tablet 324 mg (324 mg Oral Given 03/17/15 1725)  morphine 4 MG/ML injection 4 mg (4 mg Intravenous Given 03/17/15 1725)  sodium chloride 0.9 % bolus 1,000 mL (1,000 mLs Intravenous New Bag/Given 03/17/15 1724)  heparin bolus via infusion 5,000 Units (5,000 Units Intravenous Given 03/17/15 1919)  iohexol (OMNIPAQUE) 350 MG/ML injection 100 mL (100 mLs Intravenous Contrast Given 03/17/15 1850)    Filed Vitals:   03/17/15 1830 03/17/15 1916 03/17/15 1930 03/17/15 2000  BP: 129/72 117/81 109/80 114/73  Pulse: 124 117 117 120  Temp:      TempSrc:      Resp: 24 34 20 34  Height:      Weight:      SpO2: 99% 98% 99% 100%    Final diagnoses:  Hives  Chest pressure  Dyspnea  Sinus tachycardia ([place])    Admission/ observation were discussed with the admitting physician, patient and/or family and they are  comfortable with the plan.     Elnora Morrison, MD 03/17/15 2029

## 2015-03-17 NOTE — ED Notes (Signed)
CBG 195; Rapid Response contacted for appropriate bed placement

## 2015-03-17 NOTE — ED Notes (Signed)
Pt reports nausea and vomiting that began this morning along with dizziness.  Pt reports she has been unable to hold anything down today.  Pt also reports an itchy rash to her back that began last night.  Pt also c/o central chest pressure.

## 2015-03-18 ENCOUNTER — Encounter (HOSPITAL_COMMUNITY): Payer: Self-pay | Admitting: General Practice

## 2015-03-18 ENCOUNTER — Inpatient Hospital Stay (HOSPITAL_COMMUNITY): Payer: Medicaid Other

## 2015-03-18 DIAGNOSIS — R079 Chest pain, unspecified: Secondary | ICD-10-CM

## 2015-03-18 DIAGNOSIS — R791 Abnormal coagulation profile: Secondary | ICD-10-CM

## 2015-03-18 DIAGNOSIS — Z6841 Body Mass Index (BMI) 40.0 and over, adult: Secondary | ICD-10-CM

## 2015-03-18 DIAGNOSIS — G43709 Chronic migraine without aura, not intractable, without status migrainosus: Secondary | ICD-10-CM

## 2015-03-18 DIAGNOSIS — L509 Urticaria, unspecified: Secondary | ICD-10-CM | POA: Diagnosis present

## 2015-03-18 DIAGNOSIS — Z794 Long term (current) use of insulin: Secondary | ICD-10-CM

## 2015-03-18 DIAGNOSIS — R7989 Other specified abnormal findings of blood chemistry: Secondary | ICD-10-CM | POA: Diagnosis present

## 2015-03-18 DIAGNOSIS — E1165 Type 2 diabetes mellitus with hyperglycemia: Secondary | ICD-10-CM

## 2015-03-18 DIAGNOSIS — E118 Type 2 diabetes mellitus with unspecified complications: Secondary | ICD-10-CM

## 2015-03-18 LAB — CBC WITH DIFFERENTIAL/PLATELET
Basophils Absolute: 0 10*3/uL (ref 0.0–0.1)
Basophils Relative: 0 %
Eosinophils Absolute: 0 10*3/uL (ref 0.0–0.7)
Eosinophils Relative: 0 %
HCT: 37.5 % (ref 36.0–46.0)
Hemoglobin: 13.1 g/dL (ref 12.0–15.0)
Lymphocytes Relative: 22 %
Lymphs Abs: 1.4 10*3/uL (ref 0.7–4.0)
MCH: 30.5 pg (ref 26.0–34.0)
MCHC: 34.9 g/dL (ref 30.0–36.0)
MCV: 87.2 fL (ref 78.0–100.0)
Monocytes Absolute: 0.1 10*3/uL (ref 0.1–1.0)
Monocytes Relative: 1 %
Neutro Abs: 4.8 10*3/uL (ref 1.7–7.7)
Neutrophils Relative %: 77 %
Platelets: 375 10*3/uL (ref 150–400)
RBC: 4.3 MIL/uL (ref 3.87–5.11)
RDW: 13.3 % (ref 11.5–15.5)
WBC: 6.3 10*3/uL (ref 4.0–10.5)

## 2015-03-18 LAB — PROTIME-INR
INR: 1.15 (ref 0.00–1.49)
Prothrombin Time: 14.9 seconds (ref 11.6–15.2)

## 2015-03-18 LAB — COMPREHENSIVE METABOLIC PANEL
ALT: 19 U/L (ref 14–54)
AST: 13 U/L — ABNORMAL LOW (ref 15–41)
Albumin: 2.5 g/dL — ABNORMAL LOW (ref 3.5–5.0)
Alkaline Phosphatase: 52 U/L (ref 38–126)
Anion gap: 9 (ref 5–15)
BUN: 10 mg/dL (ref 6–20)
CO2: 23 mmol/L (ref 22–32)
Calcium: 8.3 mg/dL — ABNORMAL LOW (ref 8.9–10.3)
Chloride: 101 mmol/L (ref 101–111)
Creatinine, Ser: 0.81 mg/dL (ref 0.44–1.00)
GFR calc Af Amer: >60 mL/min (ref >60)
GFR calc non Af Amer: >60 mL/min (ref >60)
Glucose, Bld: 291 mg/dL — ABNORMAL HIGH (ref 65–99)
Potassium: 4.2 mmol/L (ref 3.5–5.1)
Sodium: 133 mmol/L — ABNORMAL LOW (ref 135–145)
Total Bilirubin: 0.9 mg/dL (ref 0.3–1.2)
Total Protein: 6 g/dL — ABNORMAL LOW (ref 6.5–8.1)

## 2015-03-18 LAB — RAPID URINE DRUG SCREEN, HOSP PERFORMED
Amphetamines: NOT DETECTED
Barbiturates: NOT DETECTED
Benzodiazepines: NOT DETECTED
Cocaine: NOT DETECTED
Opiates: POSITIVE — AB
Tetrahydrocannabinol: NOT DETECTED

## 2015-03-18 LAB — URINALYSIS, ROUTINE W REFLEX MICROSCOPIC
Bilirubin Urine: NEGATIVE
Glucose, UA: >1000 mg/dL — AB
Hgb urine dipstick: NEGATIVE
Ketones, ur: 15 mg/dL — AB
Leukocytes, UA: NEGATIVE
Nitrite: NEGATIVE
Protein, ur: NEGATIVE mg/dL
Specific Gravity, Urine: >1.046 — ABNORMAL HIGH (ref 1.005–1.030)
pH: 6 (ref 5.0–8.0)

## 2015-03-18 LAB — TROPONIN I: Troponin I: <0.03 ng/mL (ref <0.031)

## 2015-03-18 LAB — GLUCOSE, CAPILLARY
Glucose-Capillary: 255 mg/dL — ABNORMAL HIGH (ref 65–99)
Glucose-Capillary: 260 mg/dL — ABNORMAL HIGH (ref 65–99)
Glucose-Capillary: 196 mg/dL — ABNORMAL HIGH (ref 65–99)

## 2015-03-18 LAB — URINE MICROSCOPIC-ADD ON: RBC / HPF: NONE SEEN RBC/hpf (ref 0–5)

## 2015-03-18 MED ORDER — PERFLUTREN LIPID MICROSPHERE
INTRAVENOUS | Status: AC
  Filled 2015-03-18: qty 10

## 2015-03-18 MED ORDER — PERFLUTREN LIPID MICROSPHERE
1.0000 mL | INTRAVENOUS | Status: AC | PRN
  Administered 2015-03-18: 2 mL via INTRAVENOUS
  Filled 2015-03-18: qty 10

## 2015-03-18 NOTE — Progress Notes (Signed)
   03/17/15 2317  Vitals  Temp 97.6 F (36.4 C)  Temp Source Oral  BP 133/74 mmHg  BP Location Right Arm  BP Method Automatic  Patient Position (if appropriate) Sitting  Pulse Rate (!) 110  Resp 18  Oxygen Therapy  SpO2 99 %  O2 Device Room Air  Height and Weight  Height 5\' 9"  (1.753 m)  Weight (!) 154.858 kg (341 lb 6.4 oz)  Type of Scale Used Standing (scale A)  Type of Weight Actual  BSA (Calculated - sq m) 2.75 sq meters  BMI (Calculated) 50.5  Weight in (lb) to have BMI = 25 168.9  Admitted pt to rm 3E07 from ED, pt alert and oriented, denied pain at this time, oriented to room, call bell placed within reach, admission orders carried out. Will continue to monitor.

## 2015-03-18 NOTE — Progress Notes (Signed)
Inpatient Diabetes Program Recommendations  AACE/ADA: New Consensus Statement on Inpatient Glycemic Control (2015)  Target Ranges:  Prepandial:   less than 140 mg/dL      Peak postprandial:   less than 180 mg/dL (1-2 hours)      Critically ill patients:  140 - 180 mg/dL   Review of Glycemic Control  Diabetes history: DM2 Outpatient Diabetes medications: Byetta 10 mcg BID with meals, Metformin 850 mg BID, 70/30 20 units BID with meals Current orders for Inpatient glycemic control: Lantus 24 units QHS, Novolog 0-9 units TID with meals  Inpatient Diabetes Program Recommendations: Correction (SSI): If patient will remain NPO, please consider changing frequeny of CBGs and Novolog correction to Q4H. However, if diet is resumed, please consider adding Novolog bedtime correction scale.  Note: In reviewing chart, noted patient received a one time dose of Solumedrol 125 mg at 20:32 on 03/17/15 which is contributing to hyperglycemia. No other steroids are ordered at this time so anticipate glucose to improve once steroids wear off.   Thanks, Barnie Alderman, RN, MSN, CDE Diabetes Coordinator Inpatient Diabetes Program [phone] (Team Pager from [place] to [place]) [phone] ([facility] office) [phone] [facility] office) [phone] [facility] office)

## 2015-03-18 NOTE — Progress Notes (Signed)
[facility] PROGRESS NOTE   ANDI ORDERS [id number] DOB: 06/23/1965 DOA: 03/17/2015 PCP: Benito Mccreedy, MD  HPI/Subjective: Presented with hives in her lower extremities, SOB and lips swelling.  Assessment/Plan: Principal Problem:   Chest pain Active Problems:   Diabetes mellitus type II, uncontrolled (HCC)   Morbid obesity with body mass index of 50.0-59.9 in adult [facility])   Chronic migraine   Hive   Positive D dimer    Probable hypersensitivity reaction Patient is admitted to the hospital with hives, shortness of breath, lip swelling and sweating. All the symptoms likely secondary to hypersensitivity reaction. Unclear what exactly triggered the anaphylaxis, patient started on Inderal and Imitrex recently. Received a steroids, famotidine, Benadryl with resolution of symptoms, still has some hives in her left thigh. Hold Inderal and Imitrex, follow-up with neurology as soon as possible.  Chest pain. Patient mentioned SOB and chest pain, also has positive D-dimer test. CT angiography done showed no evidence of PE. UDS is negative -2 sets of cardiac enzymes without evidence of ischemia in the EKG as well.  Positive D-dimer test Patient has significantly positive D-dimer, >20 mcg/mL. No evidence of clotting, has negative CT angiography for PE, lower extremity Doppler is pending. Normal INR and platelets, no evidence of sepsis or infection. This could be secondary to the hypersensitivity reaction.  Chronic migraine No headache now, follow with neurology, off of intraoral and Imitrex.  Diabetes mellitus type 2 Uncontrolled diabetes mellitus type 2, glucose in the hospital were really high secondary to steroids. No recent hemoglobin A1c.    Code Status: Full Code Family Communication: Plan discussed with the patient. Disposition Plan: Remains inpatient Diet: Diet Carb Modified Fluid consistency:: Thin; Room service appropriate?:  Yes  Consultants:  None  Procedures:  None  Antibiotics:  None (indicate start date, and stop date if known)   Objective: Filed Vitals:   03/18/15 1138 03/18/15 1535  BP: 121/72 102/69  Pulse: 92 92  Temp: 97.3 F (36.3 C) 98.1 F (36.7 C)  Resp: 18 24    Intake/Output Summary (Last 24 hours) at 03/18/15 1600 Last data filed at 03/18/15 1351  Gross per 24 hour  Intake 2362.08 ml  Output    400 ml  Net 1962.08 ml   Filed Weights   03/17/15 1630 03/17/15 2317  Weight: 156.037 kg (344 lb) 154.858 kg (341 lb 6.4 oz)    Exam: General: Alert and awake, oriented x3, not in any acute distress. HEENT: anicteric sclera, pupils reactive to light and accommodation, EOMI CVS: S1-S2 clear, no murmur rubs or gallops Chest: clear to auscultation bilaterally, no wheezing, rales or rhonchi Abdomen: soft nontender, nondistended, normal bowel sounds, no organomegaly Extremities: no cyanosis, clubbing or edema noted bilaterally Neuro: Cranial nerves II-XII intact, no focal neurological deficits  Data Reviewed: Basic Metabolic Panel:  Recent Labs Lab 03/17/15 1639 03/18/15 0529  NA 131* 133*  K 4.3 4.2  CL 97* 101  CO2 22 23  GLUCOSE 234* 291*  BUN 12 10  CREATININE 0.80 0.81  CALCIUM 8.8* 8.3*   Liver Function Tests:  Recent Labs Lab 03/17/15 1639 03/18/15 0529  AST 19 13*  ALT 22 19  ALKPHOS 64 52  BILITOT 1.4* 0.9  PROT 6.5 6.0*  ALBUMIN 2.9* 2.5*    Recent Labs Lab 03/17/15 1639  LIPASE 17   No results for input(s): AMMONIA in the last 168 hours. CBC:  Recent Labs Lab 03/17/15 1639 03/18/15 0529  WBC 8.8 6.3  NEUTROABS  --  4.8  HGB 16.2* 13.1  HCT 46.2* 37.5  MCV 86.7 87.2  PLT 425* 375   Cardiac Enzymes:  Recent Labs Lab 03/18/15 0529  TROPONINI <0.03   BNP (last 3 results) No results for input(s): BNP in the last 8760 hours.  ProBNP (last 3 results) No results for input(s): PROBNP in the last 8760 hours.  CBG:  Recent  Labs Lab 03/17/15 2220 03/18/15 0616 03/18/15 1136  GLUCAP 195* 255* 260*    Micro No results found for this or any previous visit (from the past 240 hour(s)).   Studies: Dg Chest 2 View  03/17/2015  CLINICAL DATA:  Initial evaluation for acute chest pain. EXAM: CHEST  2 VIEW COMPARISON:  Prior study from 05/23/2012. FINDINGS: Cardiac and mediastinal silhouettes are stable in size and contour, and remain within normal limits. Mild elevation the right hemidiaphragm, stable. No focal infiltrate, pulmonary edema, or pleural effusion. No pneumothorax. No acute osseus abnormality. IMPRESSION: 1. Mild chronic elevation of the right hemidiaphragm, stable. 2. No other active cardiopulmonary disease. Electronically Signed   By: Jeannine Boga M.D.   On: 03/17/2015 17:11   Ct Angio Chest Pe W/cm &/or Wo Cm  03/17/2015  CLINICAL DATA:  Pt reports nausea and vomiting that began this morning along with dizziness. Rash. Pt reports she has been unable to hold anything down today. Central chest pressure. EXAM: CT ANGIOGRAPHY CHEST WITH CONTRAST TECHNIQUE: Multidetector CT imaging of the chest was performed using the standard protocol during bolus administration of intravenous contrast. Multiplanar CT image reconstructions and MIPs were obtained to evaluate the vascular anatomy. CONTRAST:  145mL OMNIPAQUE IOHEXOL 350 MG/ML SOLN COMPARISON:  Chest x-ray 03/17/2015 and chest CT 05/23/2012 FINDINGS: Heart: Heart size is normal. No imaged pericardial effusion or significant coronary artery calcifications. Vascular structures: Pulmonary arteries are well opacified. There is no evidence for acute pulmonary embolus. Thoracic aorta is not aneurysmal. Mediastinum/thyroid: The visualized portion of the thyroid gland has a normal appearance. Small calcified mediastinal lymph nodes are identified, measuring no more than 1.5 cm in diameter. Findings are consistent with chronic granulomatous disease. No suspicious noncalcified  adenopathy. Lungs/Airways: There are mild airspace filling opacities in the lungs bilaterally, predominantly at the bases consistent with small airways disease. No focal consolidations or pleural effusions. Upper abdomen: Unremarkable. Chest wall/osseous structures: Unremarkable. Review of the MIP images confirms the above findings. IMPRESSION: 1. Technically adequate exam showing no pulmonary embolus. 2. Calcified mediastinal lymph nodes are consistent chronic granulomatous disease. 3. Mild parenchymal changes consistent with small airways disease, nonspecific in appearance. Electronically Signed   By: Nolon Nations M.D.   On: 03/17/2015 19:38   Acute Abdominal Series  03/17/2015  CLINICAL DATA:  Per patient centralized chest pain, nausea, and vomiting X 1 day. HX HTN, left heart catheterization, diabetes EXAM: DG ABDOMEN ACUTE W/ 1V CHEST COMPARISON:  03/17/2015 FINDINGS: No focal consolidations or pleural effusions. No free intraperitoneal air beneath diaphragm. Bowel gas pattern is nonobstructive. There is residual contrast in the bladder following recent CT exam of chest. IMPRESSION: Negative abdominal radiographs.  No acute cardiopulmonary disease. Electronically Signed   By: Nolon Nations M.D.   On: 03/17/2015 22:04    Scheduled Meds: . enoxaparin (LOVENOX) injection  40 mg Subcutaneous Daily  . insulin aspart  0-9 Units Subcutaneous TID WC  . insulin glargine  24 Units Subcutaneous QHS  . pantoprazole (PROTONIX) IV  40 mg Intravenous QHS  . perflutren lipid microspheres (DEFINITY) IV suspension      . propranolol  60 mg Oral BID   Continuous Infusions: . sodium chloride Stopped (03/18/15 1440)       Time spent: 35 minutes    [facility]  [facility] Pager [phone] If 7PM-7AM, please contact night-coverage at [email or web address], password [facility] 03/18/2015, 4:00 PM  LOS: 1 day             \

## 2015-03-18 NOTE — Progress Notes (Signed)
*  PRELIMINARY RESULTS* Vascular Ultrasound Lower extremity venous duplex has been completed.  Preliminary findings: No obvious evidence of DVT or baker's cyst.   Landry Mellow, RDMS, RVT  03/18/2015, 4:38 PM

## 2015-03-18 NOTE — Discharge Summary (Signed)
Physician Discharge Summary  Sarah Goodman [id number] DOB: 03/25/1965 DOA: 03/17/2015  PCP: Benito Mccreedy, MD  Admit date: 03/17/2015 Discharge date: 03/18/2015  Time spent: 40 minutes  Recommendations for Outpatient Follow-up:  1. Follow-up with primary care physician within one week. 2. Check hemoglobin A1c. 3. Follow-up with neurology, off of Imitrex and Inderal, consider alternative medications for migraine.   Discharge Diagnoses:  Principal Problem:   Chest pain Active Problems:   Diabetes mellitus type II, uncontrolled (HCC)   Morbid obesity with body mass index of 50.0-59.9 in adult [facility])   Chronic migraine   Hive   Positive D dimer   Discharge Condition: Stable  Diet recommendation: Carb modified diet  Filed Weights   03/17/15 1630 03/17/15 2317  Weight: 156.037 kg (344 lb) 154.858 kg (341 lb 6.4 oz)    History of present illness:  Patient is a 50 year old female with history of DMII, HTN, asthma who came with cc of nausea and vomiting ( non bloody, bilious, #2) that started yesterday morning with no abdominal pain,diarrhea or constipation. But she has complained of chest pain ( as someone is sitting on my chest) with mild dyspnea with exertion for the past two days. No cough, wheezing, fever or chills. She also had hives starting Sat morning with mild swelling in her lower lip. Now she feels vertigo and "high" ( she took Benadryl and steroids earlier). No sensory/motor deficits complaints.   Hospital Course:   Probable hypersensitivity reaction Patient is admitted to the hospital with hives, shortness of breath, lip swelling and sweating. All the symptoms likely secondary to hypersensitivity reaction. Unclear what exactly triggered the anaphylaxis, patient started on Inderal and Imitrex recently. Received a steroids, famotidine, Benadryl with resolution of symptoms, still has some hives in her left thigh. Hold Inderal and Imitrex, follow-up with neurology  as soon as possible.  Chest pain. Patient mentioned SOB and chest pain, also has positive D-dimer test. CT angiography done showed no evidence of PE. UDS is negative -2 sets of cardiac enzymes without evidence of ischemia in the EKG as well.  Positive D-dimer test Patient has significantly positively elevated D-dimer, >20 mcg/mL (reference range less than 0.5 mcg/mL). No evidence of clotting, has negative CT angiography for PE, lower extremity Doppler preliminary report showed no evidence of DVT Normal INR and platelets, no evidence of sepsis or infection. This could be secondary to the hypersensitivity reaction.  Chronic migraine No headache now, follow with neurology, off of intraoral and Imitrex.  Diabetes mellitus type 2 Uncontrolled diabetes mellitus type 2, glucose in the hospital were really high secondary to steroids. No recent hemoglobin A1c.   Procedures:  2-D echo done on 03/18/2015. Study Conclusions  - Procedure narrative: Transthoracic echocardiography. Technically difficult study with reduced echocardiographic windows. Intravenous contrast (Definity) was administered. - Left ventricle: The cavity size was normal. There was moderate concentric hypertrophy. Systolic function was normal. The estimated ejection fraction was in the range of 60% to 65%. Wall motion was normal; there were no regional wall motion abnormalities. Doppler parameters are consistent with abnormal left ventricular relaxation (grade 1 diastolic dysfunction). The E/e&' ratio is between 8-15, suggesting normal LV filling pressure. - Left atrium: The atrium was normal in size. Impressions: - Compared to a prior study in 2014, the LVEF remains 60-65% however, there is now moderate LVH. Wall motion is normal.   Doppler ultrasound of both legs, but preliminary report showed no evidence of DVT.  Consultations:  None  Discharge Exam: Filed  Vitals:   03/18/15 1138  03/18/15 1535  BP: 121/72 102/69  Pulse: 92 92  Temp: 97.3 F (36.3 C) 98.1 F (36.7 C)  Resp: 18 24   General: Alert and awake, oriented x3, not in any acute distress. HEENT: anicteric sclera, pupils reactive to light and accommodation, EOMI CVS: S1-S2 clear, no murmur rubs or gallops Chest: clear to auscultation bilaterally, no wheezing, rales or rhonchi Abdomen: soft nontender, nondistended, normal bowel sounds, no organomegaly Extremities: no cyanosis, clubbing or edema noted bilaterally Neuro: Cranial nerves II-XII intact, no focal neurological deficits  Discharge Instructions   Discharge Instructions    Diet - low sodium heart healthy    Complete by:  As directed      Increase activity slowly    Complete by:  As directed           Current Discharge Medication List    CONTINUE these medications which have NOT CHANGED   Details  albuterol (PROVENTIL HFA;VENTOLIN HFA) 108 (90 BASE) MCG/ACT inhaler Inhale 2 puffs into the lungs 3 (three) times daily as needed for wheezing or shortness of breath.     exenatide (BYETTA) 10 MCG/0.04ML SOPN injection Inject 10 mcg into the skin 2 (two) times daily with a meal.    metFORMIN (GLUCOPHAGE) 850 MG tablet Take 1 tablet (850 mg total) by mouth 2 (two) times daily with a meal.    metoCLOPramide (REGLAN) 10 MG tablet Take 1 tablet (10 mg total) by mouth every 8 (eight) hours as needed (headache). Qty: 10 tablet, Refills: 0    nortriptyline (PAMELOR) 50 MG capsule 2 tabs po qhs Qty: 60 capsule, Refills: 11    NOVOLOG MIX 70/30 FLEXPEN (70-30) 100 UNIT/ML FlexPen INJECT 20 UNITS SUBCUTANEOUSLY BID Refills: 3    omeprazole (PRILOSEC) 20 MG capsule TK ONE C PO  QD Refills: 0      STOP taking these medications     propranolol (INDERAL) 60 MG tablet      SUMAtriptan (IMITREX) 25 MG tablet        No Known Allergies Follow-up Information    Follow up with OSEI-BONSU,GEORGE, MD In 1 week.   Specialty:  Internal Medicine    Contact information:   [id number] [place] [address] [phone]        The results of significant diagnostics from this hospitalization (including imaging, microbiology, ancillary and laboratory) are listed below for reference.    Significant Diagnostic Studies: Dg Chest 2 View  03/17/2015  CLINICAL DATA:  Initial evaluation for acute chest pain. EXAM: CHEST  2 VIEW COMPARISON:  Prior study from 05/23/2012. FINDINGS: Cardiac and mediastinal silhouettes are stable in size and contour, and remain within normal limits. Mild elevation the right hemidiaphragm, stable. No focal infiltrate, pulmonary edema, or pleural effusion. No pneumothorax. No acute osseus abnormality. IMPRESSION: 1. Mild chronic elevation of the right hemidiaphragm, stable. 2. No other active cardiopulmonary disease. Electronically Signed   By: Jeannine Boga M.D.   On: 03/17/2015 17:11   Ct Angio Chest Pe W/cm &/or Wo Cm  03/17/2015  CLINICAL DATA:  Pt reports nausea and vomiting that began this morning along with dizziness. Rash. Pt reports she has been unable to hold anything down today. Central chest pressure. EXAM: CT ANGIOGRAPHY CHEST WITH CONTRAST TECHNIQUE: Multidetector CT imaging of the chest was performed using the standard protocol during bolus administration of intravenous contrast. Multiplanar CT image reconstructions and MIPs were obtained to evaluate the vascular anatomy. CONTRAST:  124mL  OMNIPAQUE IOHEXOL 350 MG/ML SOLN COMPARISON:  Chest x-ray 03/17/2015 and chest CT 05/23/2012 FINDINGS: Heart: Heart size is normal. No imaged pericardial effusion or significant coronary artery calcifications. Vascular structures: Pulmonary arteries are well opacified. There is no evidence for acute pulmonary embolus. Thoracic aorta is not aneurysmal. Mediastinum/thyroid: The visualized portion of the thyroid gland has a normal appearance. Small calcified mediastinal lymph nodes are identified, measuring no  more than 1.5 cm in diameter. Findings are consistent with chronic granulomatous disease. No suspicious noncalcified adenopathy. Lungs/Airways: There are mild airspace filling opacities in the lungs bilaterally, predominantly at the bases consistent with small airways disease. No focal consolidations or pleural effusions. Upper abdomen: Unremarkable. Chest wall/osseous structures: Unremarkable. Review of the MIP images confirms the above findings. IMPRESSION: 1. Technically adequate exam showing no pulmonary embolus. 2. Calcified mediastinal lymph nodes are consistent chronic granulomatous disease. 3. Mild parenchymal changes consistent with small airways disease, nonspecific in appearance. Electronically Signed   By: Nolon Nations M.D.   On: 03/17/2015 19:38   Acute Abdominal Series  03/17/2015  CLINICAL DATA:  Per patient centralized chest pain, nausea, and vomiting X 1 day. HX HTN, left heart catheterization, diabetes EXAM: DG ABDOMEN ACUTE W/ 1V CHEST COMPARISON:  03/17/2015 FINDINGS: No focal consolidations or pleural effusions. No free intraperitoneal air beneath diaphragm. Bowel gas pattern is nonobstructive. There is residual contrast in the bladder following recent CT exam of chest. IMPRESSION: Negative abdominal radiographs.  No acute cardiopulmonary disease. Electronically Signed   By: Nolon Nations M.D.   On: 03/17/2015 22:04    Microbiology: No results found for this or any previous visit (from the past 240 hour(s)).   Labs: Basic Metabolic Panel:  Recent Labs Lab 03/17/15 1639 03/18/15 0529  NA 131* 133*  K 4.3 4.2  CL 97* 101  CO2 22 23  GLUCOSE 234* 291*  BUN 12 10  CREATININE 0.80 0.81  CALCIUM 8.8* 8.3*   Liver Function Tests:  Recent Labs Lab 03/17/15 1639 03/18/15 0529  AST 19 13*  ALT 22 19  ALKPHOS 64 52  BILITOT 1.4* 0.9  PROT 6.5 6.0*  ALBUMIN 2.9* 2.5*    Recent Labs Lab 03/17/15 1639  LIPASE 17   No results for input(s): AMMONIA in the last  168 hours. CBC:  Recent Labs Lab 03/17/15 1639 03/18/15 0529  WBC 8.8 6.3  NEUTROABS  --  4.8  HGB 16.2* 13.1  HCT 46.2* 37.5  MCV 86.7 87.2  PLT 425* 375   Cardiac Enzymes:  Recent Labs Lab 03/18/15 0529  TROPONINI <0.03   BNP: BNP (last 3 results) No results for input(s): BNP in the last 8760 hours.  ProBNP (last 3 results) No results for input(s): PROBNP in the last 8760 hours.  CBG:  Recent Labs Lab 03/17/15 2220 03/18/15 0616 03/18/15 1136  GLUCAP 195* 255* 260*       Signed:  Verlee Monte A MD.  [facility] 03/18/2015, 4:50 PM

## 2015-03-18 NOTE — Progress Notes (Signed)
Echocardiogram 2D Echocardiogram with Definity has been performed.  Tresa Res 03/18/2015, 2:54 PM

## 2015-03-18 NOTE — Progress Notes (Signed)
Orders received for pt discharge.  Discharge summary printed and reviewed with pt.  Explained medication regimen, and pt had no further questions at this time.  IV removed and site remains clean, dry, intact.  Telemetry removed.  Pt in stable condition and awaiting transport. 

## 2015-03-19 ENCOUNTER — Encounter (HOSPITAL_COMMUNITY): Payer: Self-pay | Admitting: Nurse Practitioner

## 2015-03-19 ENCOUNTER — Observation Stay (HOSPITAL_COMMUNITY)
Admission: EM | Admit: 2015-03-19 | Discharge: 2015-03-21 | Disposition: A | Discharge status: Home / Self Care | Payer: Medicaid Other | Attending: Internal Medicine | Admitting: Internal Medicine

## 2015-03-19 ENCOUNTER — Emergency Department (HOSPITAL_COMMUNITY): Payer: Medicaid Other

## 2015-03-19 DIAGNOSIS — Z8249 Family history of ischemic heart disease and other diseases of the circulatory system: Secondary | ICD-10-CM | POA: Insufficient documentation

## 2015-03-19 DIAGNOSIS — Z9114 Patient's other noncompliance with medication regimen: Secondary | ICD-10-CM | POA: Diagnosis not present

## 2015-03-19 DIAGNOSIS — E876 Hypokalemia: Secondary | ICD-10-CM | POA: Diagnosis not present

## 2015-03-19 DIAGNOSIS — Z794 Long term (current) use of insulin: Secondary | ICD-10-CM | POA: Diagnosis not present

## 2015-03-19 DIAGNOSIS — E1165 Type 2 diabetes mellitus with hyperglycemia: Secondary | ICD-10-CM

## 2015-03-19 DIAGNOSIS — Z79899 Other long term (current) drug therapy: Secondary | ICD-10-CM | POA: Insufficient documentation

## 2015-03-19 DIAGNOSIS — R110 Nausea: Secondary | ICD-10-CM

## 2015-03-19 DIAGNOSIS — D649 Anemia, unspecified: Secondary | ICD-10-CM | POA: Insufficient documentation

## 2015-03-19 DIAGNOSIS — G43909 Migraine, unspecified, not intractable, without status migrainosus: Secondary | ICD-10-CM | POA: Diagnosis not present

## 2015-03-19 DIAGNOSIS — N939 Abnormal uterine and vaginal bleeding, unspecified: Secondary | ICD-10-CM

## 2015-03-19 DIAGNOSIS — I10 Essential (primary) hypertension: Secondary | ICD-10-CM | POA: Insufficient documentation

## 2015-03-19 DIAGNOSIS — R079 Chest pain, unspecified: Secondary | ICD-10-CM | POA: Diagnosis present

## 2015-03-19 DIAGNOSIS — R0789 Other chest pain: Secondary | ICD-10-CM

## 2015-03-19 DIAGNOSIS — I160 Hypertensive urgency: Secondary | ICD-10-CM | POA: Diagnosis not present

## 2015-03-19 DIAGNOSIS — K219 Gastro-esophageal reflux disease without esophagitis: Secondary | ICD-10-CM | POA: Diagnosis not present

## 2015-03-19 DIAGNOSIS — Z6841 Body Mass Index (BMI) 40.0 and over, adult: Secondary | ICD-10-CM | POA: Insufficient documentation

## 2015-03-19 DIAGNOSIS — E119 Type 2 diabetes mellitus without complications: Secondary | ICD-10-CM

## 2015-03-19 DIAGNOSIS — IMO0002 Reserved for concepts with insufficient information to code with codable children: Secondary | ICD-10-CM | POA: Diagnosis present

## 2015-03-19 HISTORY — DX: Morbid (severe) obesity due to excess calories: E66.01

## 2015-03-19 HISTORY — DX: Chest pain, unspecified: R07.9

## 2015-03-19 HISTORY — DX: Type 2 diabetes mellitus without complications: E11.9

## 2015-03-19 LAB — CBC WITH DIFFERENTIAL/PLATELET
Basophils Absolute: 0 10*3/uL (ref 0.0–0.1)
Basophils Relative: 0 %
Eosinophils Absolute: 0.2 10*3/uL (ref 0.0–0.7)
Eosinophils Relative: 3 %
HCT: 33.7 % — ABNORMAL LOW (ref 36.0–46.0)
Hemoglobin: 11.5 g/dL — ABNORMAL LOW (ref 12.0–15.0)
Lymphocytes Relative: 51 %
Lymphs Abs: 3.8 10*3/uL (ref 0.7–4.0)
MCH: 30 pg (ref 26.0–34.0)
MCHC: 34.1 g/dL (ref 30.0–36.0)
MCV: 88 fL (ref 78.0–100.0)
Monocytes Absolute: 0.5 10*3/uL (ref 0.1–1.0)
Monocytes Relative: 7 %
Neutro Abs: 2.8 10*3/uL (ref 1.7–7.7)
Neutrophils Relative %: 39 %
Platelets: 408 10*3/uL — ABNORMAL HIGH (ref 150–400)
RBC: 3.83 MIL/uL — ABNORMAL LOW (ref 3.87–5.11)
RDW: 13.2 % (ref 11.5–15.5)
WBC: 7.3 10*3/uL (ref 4.0–10.5)

## 2015-03-19 LAB — I-STAT TROPONIN, ED: Troponin i, poc: 0 ng/mL (ref 0.00–0.08)

## 2015-03-19 LAB — BASIC METABOLIC PANEL
Anion gap: 14 (ref 5–15)
BUN: 12 mg/dL (ref 6–20)
CO2: 23 mmol/L (ref 22–32)
Calcium: 9 mg/dL (ref 8.9–10.3)
Chloride: 101 mmol/L (ref 101–111)
Creatinine, Ser: 0.49 mg/dL (ref 0.44–1.00)
GFR calc Af Amer: >60 mL/min (ref >60)
GFR calc non Af Amer: >60 mL/min (ref >60)
Glucose, Bld: 171 mg/dL — ABNORMAL HIGH (ref 65–99)
Potassium: 3.3 mmol/L — ABNORMAL LOW (ref 3.5–5.1)
Sodium: 138 mmol/L (ref 135–145)

## 2015-03-19 LAB — CBG MONITORING, ED: Glucose-Capillary: 153 mg/dL — ABNORMAL HIGH (ref 65–99)

## 2015-03-19 LAB — PROTIME-INR
INR: 1.05 (ref 0.00–1.49)
Prothrombin Time: 13.5 seconds (ref 11.6–15.2)

## 2015-03-19 LAB — HEMOGLOBIN A1C
Hgb A1c MFr Bld: 8.2 % — ABNORMAL HIGH (ref 4.8–5.6)
Mean Plasma Glucose: 189 mg/dL

## 2015-03-19 LAB — TROPONIN I: Troponin I: <0.03 ng/mL (ref <0.031)

## 2015-03-19 MED ORDER — SODIUM CHLORIDE 0.9 % IV SOLN: INTRAVENOUS | Status: DC

## 2015-03-19 MED ORDER — OXYCODONE-ACETAMINOPHEN 5-325 MG PO TABS
1.0000 | ORAL_TABLET | Freq: Once | ORAL | Status: AC
  Administered 2015-03-19: 1 via ORAL
  Filled 2015-03-19: qty 1

## 2015-03-19 NOTE — ED Provider Notes (Signed)
CSN: [id number]     Arrival date & time 03/19/15  2130 History  By signing my name below, I, Sarah Goodman, attest that this documentation has been prepared under the direction and in the presence of [name], MD. Electronically Signed: Doran Goodman, ED Scribe. 03/19/2015. 11:35 PM.   Chief Complaint  Patient presents with  . Shortness of Breath  . Chest Pain   The history is provided by the patient. No language interpreter was used.   HPI Comments: Sarah Goodman is a 50 y.o. female with a h/o DM and HTN who presents to the Emergency Department complaining of constant burning chest pain radiating to her abdomen. Pt rates her chest pain a 10/10. Pt also reports SOB and BLE swelling. Pts SOB is exacerbated with ambulation. Pt reports that she was seen on 03/17/15. She states they did not find a "blood clot" however she had a positive blood work. Pt denies any fever, cough, nausea, vomiting, diarrhea. Pt denies sick contacts. Pt has a maternal family history of early CAD. Pt did not take full dose aspirin today.  Of note, patient was admitted 2 days ago. Had a negative CTA, negative bilateral lower extremity Dopplers, repeat troponins that were negative and an echo that was reassuring. She has not followed up with cardiology.  Past Medical History  Diagnosis Date  . Diabetes mellitus   . Headache(784.0)     migraines  . GERD (gastroesophageal reflux disease)   . Hypertension     Patient denies, no BP med , d/c'd by MD  . Migraine   . Memory difficulty   . Positive D dimer 03/2015   Past Surgical History  Procedure Laterality Date  . Hysteroscopy w/d&c  12/18/2011    Procedure: DILATATION AND CURETTAGE /HYSTEROSCOPY;  Surgeon: Lahoma Crocker, MD;  Location: [facility];  Service: Gynecology;  Laterality: N/A;  . Colposcopy  12/18/2011    Procedure: COLPOSCOPY;  Surgeon: Lahoma Crocker, MD;  Location: [facility];  Service: Gynecology;  Laterality: N/A;  . Left heart  catheterization with coronary angiogram N/A 05/26/2012    Procedure: LEFT HEART CATHETERIZATION WITH CORONARY ANGIOGRAM;  Surgeon: Sinclair Grooms, MD;  Location: [facility] C[facility] LAB;  Service: Cardiovascular;  Laterality: N/A;   Family History  Problem Relation Age of Onset  . Diabetes Mother   . Hypertension Mother   . CAD Mother 48    Early onset CAD, with "blockage in artery and stent in heart"   Social History  Substance Use Topics  . Smoking status: Never Smoker   . Smokeless tobacco: Never Used  . Alcohol Use: No   OB History    Gravida Para Term Preterm AB TAB SAB Ectopic Multiple Living   4 4        4      Review of Systems  Constitutional: Negative for fever.  Respiratory: Positive for shortness of breath. Negative for cough.   Cardiovascular: Positive for chest pain and leg swelling.  Gastrointestinal: Negative for nausea, vomiting and diarrhea.  All other systems reviewed and are negative.  Allergies  Review of patient's allergies indicates no known allergies.  Home Medications   Prior to Admission medications   Medication Sig Start Date End Date Taking? Authorizing Provider  albuterol (PROVENTIL HFA;VENTOLIN HFA) 108 (90 BASE) MCG/ACT inhaler Inhale 2 puffs into the lungs 3 (three) times daily as needed for wheezing or shortness of breath.    Yes Historical Provider, MD  exenatide (BYETTA) 10 MCG/0.04ML SOPN injection  Inject 10 mcg into the skin 2 (two) times daily with a meal.   Yes Historical Provider, MD  metFORMIN (GLUCOPHAGE) 850 MG tablet Take 1 tablet (850 mg total) by mouth 2 (two) times daily with a meal. 05/29/12  Yes [name], NP  metoCLOPramide (REGLAN) 10 MG tablet Take 1 tablet (10 mg total) by mouth every 8 (eight) hours as needed (headache). 03/06/15  Yes [name], MD  nortriptyline (PAMELOR) 50 MG capsule 2 tabs po qhs Patient taking differently: Take 100 mg by mouth at bedtime.  02/27/15  Yes [name], MD  NOVOLOG MIX 70/30 FLEXPEN (70-30) 100  UNIT/ML FlexPen INJECT 20 UNITS SUBCUTANEOUSLY BID 12/19/14  Yes Historical Provider, MD  omeprazole (PRILOSEC) 20 MG capsule Take 20 mg by mouth daily 02/13/15  Yes Historical Provider, MD   BP 172/82 mmHg  Pulse 80  Temp(Src) 98 F (36.7 C) (Oral)  Resp 21  SpO2 100% Physical Exam  Constitutional: She is oriented to person, place, and time. No distress.  Morbidly obese  HENT:  Head: Normocephalic and atraumatic.  Cardiovascular: Normal rate, regular rhythm and normal heart sounds.   Pulmonary/Chest: Effort normal and breath sounds normal. No respiratory distress. She has no wheezes.  Limited by body habitus  Abdominal: Soft. Bowel sounds are normal. There is no tenderness. There is no rebound.  Musculoskeletal: She exhibits edema.  1+ bilateral lower extremity edema  Neurological: She is alert and oriented to person, place, and time.  Skin: Skin is warm and dry.  Psychiatric: She has a normal mood and affect.  Nursing note and vitals reviewed.   ED Course  Procedures  DIAGNOSTIC STUDIES: Oxygen Saturation is 96% on room air, normal by my interpretation.    COORDINATION OF CARE: 11:05 PM Will order blood work, CXR, and EKG. Will give fluids and pain medication. Discussed treatment plan with pt at bedside and pt agreed to plan.   Labs Review Labs Reviewed  CBC WITH DIFFERENTIAL/PLATELET - Abnormal; Notable for the following:    RBC 3.83 (*)    Hemoglobin 11.5 (*)    HCT 33.7 (*)    Platelets 408 (*)    All other components within normal limits  BASIC METABOLIC PANEL - Abnormal; Notable for the following:    Potassium 3.3 (*)    Glucose, Bld 171 (*)    All other components within normal limits  CBG MONITORING, ED - Abnormal; Notable for the following:    Glucose-Capillary 153 (*)    All other components within normal limits  TROPONIN I  PROTIME-INR  BRAIN NATRIURETIC PEPTIDE  I-STAT TROPOININ, ED  [name], ED    Imaging Review Dg Chest 2 View  03/19/2015   CLINICAL DATA:  50 year old female with shortness of breath EXAM: CHEST  2 VIEW COMPARISON:  Radiograph dated 03/17/2015 FINDINGS: The heart size and mediastinal contours are within normal limits. Both lungs are clear. The visualized skeletal structures are unremarkable. IMPRESSION: No active cardiopulmonary disease. Electronically Signed   By: Anner Crete M.D.   On: 03/19/2015 22:40   I have personally reviewed and evaluated these images and lab results as part of my medical decision-making.   EKG Interpretation   Date/Time:  Tuesday March 19 2015 21:36:29 EST Ventricular Rate:  89 PR Interval:  140 QRS Duration: 82 QT Interval:  335 QTC Calculation: 408 R Axis:   36 Text Interpretation:  Sinus rhythm Abnormal R-wave progression, early  transition Confirmed by Teancum Brule  MD, Saleen Peden ([id number]) on 03/19/2015  11:03:29  PM      MDM   Final diagnoses:  Hypertensive urgency  Other chest pain    Patient presents with chest pain. Recent admission for chest pain or she had multiple studies and serial troponins. She is morbidly obese. Notably hypertensive here. It appears previously she has been normotensive. She also has a history of diabetes. EKG is nonischemic. Delta troponin is negative.  Chest pain not improve with blood pressure control or nitroglycerin paste. Patient was given 5 mg IV hydralazine for her blood pressure.  On recheck, patient reports persistent and unchanged pain. She states "I feel like him to die."    Discussed the patient with hospitalist regarding admission for possible hypertensive urgency and ongoing chest pain. He is requested I consult cardiology. Discussed with Dr. Claiborne Billings. He feels it's reasonable to admit for ongoing chest pain. Recommend stay time cardiology consult for possible inpatient stress evaluation.  Discussed this with the admitting hospitalist.  Patient to be admitted to telemetry.  I personally performed the services described in this documentation,  which was scribed in my presence. The recorded information has been reviewed and is accurate.    [name], MD 03/20/15 [phone]

## 2015-03-19 NOTE — ED Notes (Signed)
Pt c/o sudden onset of shOB, with chest pain, discharged last night after hospitalization for blood clots, anxious and fidgety during this initial assessment.

## 2015-03-20 ENCOUNTER — Encounter (HOSPITAL_COMMUNITY): Payer: Self-pay | Admitting: *Deleted

## 2015-03-20 ENCOUNTER — Observation Stay (HOSPITAL_COMMUNITY): Payer: Medicaid Other

## 2015-03-20 DIAGNOSIS — R072 Precordial pain: Secondary | ICD-10-CM

## 2015-03-20 DIAGNOSIS — E118 Type 2 diabetes mellitus with unspecified complications: Secondary | ICD-10-CM

## 2015-03-20 DIAGNOSIS — K219 Gastro-esophageal reflux disease without esophagitis: Secondary | ICD-10-CM | POA: Diagnosis not present

## 2015-03-20 DIAGNOSIS — R110 Nausea: Secondary | ICD-10-CM | POA: Diagnosis not present

## 2015-03-20 DIAGNOSIS — R079 Chest pain, unspecified: Secondary | ICD-10-CM | POA: Diagnosis not present

## 2015-03-20 DIAGNOSIS — Z794 Long term (current) use of insulin: Secondary | ICD-10-CM

## 2015-03-20 DIAGNOSIS — E1165 Type 2 diabetes mellitus with hyperglycemia: Secondary | ICD-10-CM

## 2015-03-20 DIAGNOSIS — E119 Type 2 diabetes mellitus without complications: Secondary | ICD-10-CM

## 2015-03-20 LAB — URINALYSIS, ROUTINE W REFLEX MICROSCOPIC
Bilirubin Urine: NEGATIVE
Glucose, UA: NEGATIVE mg/dL
Ketones, ur: NEGATIVE mg/dL
Nitrite: NEGATIVE
Protein, ur: NEGATIVE mg/dL
Specific Gravity, Urine: 1.011 (ref 1.005–1.030)
pH: 6.5 (ref 5.0–8.0)

## 2015-03-20 LAB — CREATININE, SERUM
Creatinine, Ser: 0.58 mg/dL (ref 0.44–1.00)
GFR calc Af Amer: >60 mL/min (ref >60)
GFR calc non Af Amer: >60 mL/min (ref >60)

## 2015-03-20 LAB — GLUCOSE, CAPILLARY
Glucose-Capillary: 194 mg/dL — ABNORMAL HIGH (ref 65–99)
Glucose-Capillary: 135 mg/dL — ABNORMAL HIGH (ref 65–99)
Glucose-Capillary: 129 mg/dL — ABNORMAL HIGH (ref 65–99)
Glucose-Capillary: 143 mg/dL — ABNORMAL HIGH (ref 65–99)

## 2015-03-20 LAB — TROPONIN I
Troponin I: <0.03 ng/mL (ref <0.031)
Troponin I: <0.03 ng/mL (ref <0.031)
Troponin I: <0.03 ng/mL (ref <0.031)

## 2015-03-20 LAB — HEPATIC FUNCTION PANEL
ALT: 18 U/L (ref 14–54)
AST: 18 U/L (ref 15–41)
Albumin: 3 g/dL — ABNORMAL LOW (ref 3.5–5.0)
Alkaline Phosphatase: 47 U/L (ref 38–126)
Bilirubin, Direct: <0.1 mg/dL — ABNORMAL LOW (ref 0.1–0.5)
Total Bilirubin: 0.3 mg/dL (ref 0.3–1.2)
Total Protein: 6.4 g/dL — ABNORMAL LOW (ref 6.5–8.1)

## 2015-03-20 LAB — CBC
HCT: 32.3 % — ABNORMAL LOW (ref 36.0–46.0)
Hemoglobin: 11 g/dL — ABNORMAL LOW (ref 12.0–15.0)
MCH: 30.1 pg (ref 26.0–34.0)
MCHC: 34.1 g/dL (ref 30.0–36.0)
MCV: 88.5 fL (ref 78.0–100.0)
Platelets: 388 10*3/uL (ref 150–400)
RBC: 3.65 MIL/uL — ABNORMAL LOW (ref 3.87–5.11)
RDW: 13.2 % (ref 11.5–15.5)
WBC: 6.4 10*3/uL (ref 4.0–10.5)

## 2015-03-20 LAB — LIPASE, BLOOD: Lipase: 20 U/L (ref 11–51)

## 2015-03-20 LAB — URINE MICROSCOPIC-ADD ON: Bacteria, UA: NONE SEEN

## 2015-03-20 LAB — BRAIN NATRIURETIC PEPTIDE: B Natriuretic Peptide: 69.5 pg/mL (ref 0.0–100.0)

## 2015-03-20 LAB — PREGNANCY, URINE: Preg Test, Ur: NEGATIVE

## 2015-03-20 LAB — I-STAT TROPONIN, ED: Troponin i, poc: 0 ng/mL (ref 0.00–0.08)

## 2015-03-20 MED ORDER — ACETAMINOPHEN 325 MG PO TABS: 650.0000 mg | ORAL_TABLET | Freq: Four times a day (QID) | ORAL | Status: DC | PRN

## 2015-03-20 MED ORDER — NITROGLYCERIN 0.4 MG SL SUBL: 0.4000 mg | SUBLINGUAL_TABLET | SUBLINGUAL | Status: DC | PRN

## 2015-03-20 MED ORDER — SUCRALFATE 1 GM/10ML PO SUSP: 1.0000 g | Freq: Three times a day (TID) | ORAL | Status: DC

## 2015-03-20 MED ORDER — FAMOTIDINE 20 MG PO TABS
20.0000 mg | ORAL_TABLET | Freq: Once | ORAL | Status: AC
  Administered 2015-03-20: 20 mg via ORAL
  Filled 2015-03-20: qty 1

## 2015-03-20 MED ORDER — NITROGLYCERIN 2 % TD OINT
1.0000 [in_us] | TOPICAL_OINTMENT | Freq: Once | TRANSDERMAL | Status: AC
  Administered 2015-03-20: 1 [in_us] via TOPICAL
  Filled 2015-03-20: qty 1

## 2015-03-20 MED ORDER — ACETAMINOPHEN 650 MG RE SUPP: 650.0000 mg | Freq: Four times a day (QID) | RECTAL | Status: DC | PRN

## 2015-03-20 MED ORDER — ENOXAPARIN SODIUM 80 MG/0.8ML ~~LOC~~ SOLN
80.0000 mg | Freq: Every day | SUBCUTANEOUS | Status: DC
  Administered 2015-03-20: 80 mg via SUBCUTANEOUS
  Filled 2015-03-20 (×2): qty 0.8

## 2015-03-20 MED ORDER — INSULIN ASPART 100 UNIT/ML ~~LOC~~ SOLN
0.0000 [IU] | Freq: Three times a day (TID) | SUBCUTANEOUS | Status: DC
  Administered 2015-03-20: 2 [IU] via SUBCUTANEOUS
  Administered 2015-03-20 – 2015-03-21 (×2): 1 [IU] via SUBCUTANEOUS

## 2015-03-20 MED ORDER — OMEPRAZOLE 20 MG PO CPDR: 20.0000 mg | DELAYED_RELEASE_CAPSULE | Freq: Two times a day (BID) | ORAL | Status: DC

## 2015-03-20 MED ORDER — INSULIN ASPART PROT & ASPART (70-30 MIX) 100 UNIT/ML PEN
20.0000 [IU] | PEN_INJECTOR | Freq: Two times a day (BID) | SUBCUTANEOUS | Status: DC
  Filled 2015-03-20: qty 3

## 2015-03-20 MED ORDER — HYDRALAZINE HCL 20 MG/ML IJ SOLN
5.0000 mg | Freq: Once | INTRAMUSCULAR | Status: AC
  Administered 2015-03-20: 5 mg via INTRAVENOUS
  Filled 2015-03-20: qty 1

## 2015-03-20 MED ORDER — POTASSIUM CHLORIDE CRYS ER 20 MEQ PO TBCR
40.0000 meq | EXTENDED_RELEASE_TABLET | Freq: Once | ORAL | Status: AC
  Administered 2015-03-20: 40 meq via ORAL
  Filled 2015-03-20: qty 2

## 2015-03-20 MED ORDER — MORPHINE SULFATE (PF) 2 MG/ML IV SOLN
1.0000 mg | INTRAVENOUS | Status: DC | PRN
  Administered 2015-03-20: 1 mg via INTRAVENOUS
  Administered 2015-03-20: 0.5 mg via INTRAVENOUS
  Administered 2015-03-21: 1 mg via INTRAVENOUS
  Filled 2015-03-20 (×3): qty 1

## 2015-03-20 MED ORDER — ONDANSETRON HCL 4 MG/2ML IJ SOLN
4.0000 mg | Freq: Four times a day (QID) | INTRAMUSCULAR | Status: DC | PRN
  Administered 2015-03-20 (×2): 4 mg via INTRAVENOUS
  Filled 2015-03-20 (×2): qty 2

## 2015-03-20 MED ORDER — EXENATIDE 10 MCG/0.04ML ~~LOC~~ SOPN: 10.0000 ug | PEN_INJECTOR | Freq: Two times a day (BID) | SUBCUTANEOUS | Status: DC

## 2015-03-20 MED ORDER — ONDANSETRON HCL 4 MG PO TABS: 4.0000 mg | ORAL_TABLET | Freq: Four times a day (QID) | ORAL | Status: DC | PRN

## 2015-03-20 MED ORDER — LISINOPRIL 10 MG PO TABS
5.0000 mg | ORAL_TABLET | Freq: Every day | ORAL | Status: DC
  Administered 2015-03-20 – 2015-03-21 (×2): 5 mg via ORAL
  Filled 2015-03-20 (×2): qty 1

## 2015-03-20 MED ORDER — NORTRIPTYLINE HCL 25 MG PO CAPS
100.0000 mg | ORAL_CAPSULE | Freq: Every day | ORAL | Status: DC
  Administered 2015-03-20: 100 mg via ORAL
  Filled 2015-03-20 (×2): qty 4

## 2015-03-20 MED ORDER — METOCLOPRAMIDE HCL 10 MG PO TABS: 10.0000 mg | ORAL_TABLET | Freq: Three times a day (TID) | ORAL | Status: DC | PRN

## 2015-03-20 MED ORDER — HYDRALAZINE HCL 20 MG/ML IJ SOLN: 10.0000 mg | INTRAMUSCULAR | Status: DC | PRN

## 2015-03-20 MED ORDER — HYDROCODONE-ACETAMINOPHEN 5-325 MG PO TABS
1.0000 | ORAL_TABLET | ORAL | Status: DC | PRN
  Administered 2015-03-20: 2 via ORAL
  Administered 2015-03-21: 1 via ORAL
  Filled 2015-03-20 (×2): qty 2

## 2015-03-20 MED ORDER — PANTOPRAZOLE SODIUM 40 MG PO TBEC
40.0000 mg | DELAYED_RELEASE_TABLET | Freq: Every day | ORAL | Status: DC
  Administered 2015-03-20: 40 mg via ORAL
  Filled 2015-03-20: qty 1

## 2015-03-20 MED ORDER — GI COCKTAIL ~~LOC~~
30.0000 mL | Freq: Once | ORAL | Status: AC
  Administered 2015-03-20: 30 mL via ORAL
  Filled 2015-03-20: qty 30

## 2015-03-20 MED ORDER — ENOXAPARIN SODIUM 40 MG/0.4ML ~~LOC~~ SOLN: 40.0000 mg | SUBCUTANEOUS | Status: DC

## 2015-03-20 MED ORDER — ALBUTEROL SULFATE (2.5 MG/3ML) 0.083% IN NEBU: 3.0000 mL | INHALATION_SOLUTION | Freq: Three times a day (TID) | RESPIRATORY_TRACT | Status: DC | PRN

## 2015-03-20 MED ORDER — ASPIRIN EC 325 MG PO TBEC
325.0000 mg | DELAYED_RELEASE_TABLET | Freq: Every day | ORAL | Status: DC
  Administered 2015-03-20: 325 mg via ORAL
  Filled 2015-03-20: qty 1

## 2015-03-20 MED ORDER — PANTOPRAZOLE SODIUM 40 MG PO TBEC
40.0000 mg | DELAYED_RELEASE_TABLET | Freq: Two times a day (BID) | ORAL | Status: DC
  Administered 2015-03-20 – 2015-03-21 (×2): 40 mg via ORAL
  Filled 2015-03-20 (×2): qty 1

## 2015-03-20 NOTE — Progress Notes (Signed)
Rx Brief note:  Lovenox  Pt wt=154 kg CrCl~139 ml/min BMI=50.4  rx adjusted Lovenox to 80mg  (~0.5 mg/kg) in pt with BMI>30  Thanks Dorrene German 03/20/2015 6:12 AM

## 2015-03-20 NOTE — Consult Note (Signed)
Cardiology Consult    Patient ID: QUINNISHA EUSTAQUIO MRN: [id number], DOB/AGE: February 04, 1966   Admit date: 03/19/2015 Date of Consult: 03/20/2015  Primary Physician: Benito Mccreedy, MD Primary Cardiologist: Linard Millers, MD (2014) Requesting Provider: Leland Her  Patient Profile    50 year old female with a history of chest pain, hypertension, obesity, and diabetes who presents for the second time this week secondary to ongoing chest pain.   Past Medical History   Past Medical History  Diagnosis Date  . Type II diabetes mellitus ([place])   . Headache(784.0)     migraines  . GERD (gastroesophageal reflux disease)   . Hypertension     Patient denies, no BP med , d/c'd by MD  . Migraine   . Memory difficulty   . Positive D dimer 03/2015  . Chest pain     a. 05/2012 Cath: LM nl, LAD nl, LCX nl, RCA nl, EF 65%;  b. 02/2015 Echo: EF 60-65%, no rwma, Gr 1 DD, nl LA size; c. 02/2015 CTA chest: No PE.  . Morbid obesity [facility])     Past Surgical History  Procedure Laterality Date  . Hysteroscopy w/d&c  12/18/2011    Procedure: DILATATION AND CURETTAGE /HYSTEROSCOPY;  Surgeon: Lahoma Crocker, MD;  Location: [facility];  Service: Gynecology;  Laterality: N/A;  . Colposcopy  12/18/2011    Procedure: COLPOSCOPY;  Surgeon: Lahoma Crocker, MD;  Location: [place] [facility];  Service: Gynecology;  Laterality: N/A;  . Left heart catheterization with coronary angiogram N/A 05/26/2012    Procedure: LEFT HEART CATHETERIZATION WITH CORONARY ANGIOGRAM;  Surgeon: Sinclair Grooms, MD;  Location: [facility];  Service: Cardiovascular;  Laterality: N/A;     Allergies  No Known Allergies  History of Present Illness    50 year old female with the above, plus past medical history. She is morbidly obese and has a history of hypertension, diabetes, and migraine headaches. She also has a history of chest pain dating back at least to April 2014. At that time, she underwent diagnostic catheterization revealing normal  coronary arteries. Approximately 3 weeks ago, she began to experience constant 3 out of 10 retrosternal chest discomfort that frequently worsened to 10 out of 10 without any provocation. Symptoms occur and worsen both at rest and with exertion. She has also had increased headaches over that period of time. She reports a prior history of migraine headaches, which she says were reasonably well controlled until recently. She also has been having dyspnea exertion, which again she says is only been going on for about 3 weeks. Because of these symptoms, she was observed at [facility] a few days ago. She ruled out for MI. Echo showed normal LV function. CT angiography of the chest was normal. She was placed on medication for migraines. Unfortunately, she continued to have persistent chest discomfort with intermittent worsening and presented to [facility] on February 7. Again, troponins were normal. ECG nonacute. Chest pain has not been worse with palpation, deep breathing, position changes, or activity.  Inpatient Medications    . aspirin EC  325 mg Oral Daily  . enoxaparin (LOVENOX) injection  80 mg Subcutaneous Daily  . exenatide  10 mcg Subcutaneous BID WC  . insulin aspart  0-9 Units Subcutaneous TID WC  . insulin aspart protamine - aspart  20 Units Subcutaneous BID WC  . nortriptyline  100 mg Oral QHS  . pantoprazole  40 mg Oral Daily    Family History    Family History  Problem Relation Age  of Onset  . Diabetes Mother   . Hypertension Mother   . CAD Mother 56    Early onset CAD, with "blockage in artery and stent in heart"    Social History    Social History   Social History  . Marital Status: Single    Spouse Name: N/A  . Number of Children: 4  . Years of Education: 12   Occupational History  . Unemployed    Social History Main Topics  . Smoking status: Never Smoker   . Smokeless tobacco: Never Used  . Alcohol Use: No  . Drug Use: No  . Sexual Activity:    Partners: Male     Birth Control/ Protection: None   Other Topics Concern  . Not on file   Social History Narrative   Lives at home with her 2 sons.  Grown dtr out of the house.   Right-handed.   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   Retired Quarry manager.     Review of Systems    General:  No chills, fever, night sweats or weight changes.  Cardiovascular:  +++ chest pain, +++ dyspnea on exertion, no edema, orthopnea, palpitations, paroxysmal nocturnal dyspnea. Dermatological: No rash, lesions/masses Respiratory: No cough, +++ dyspnea Urologic: No hematuria, dysuria Abdominal:   No nausea, vomiting, diarrhea, bright red blood per rectum, melena, or hematemesis Neurologic: +++ h/a, no visual changes, wkns, changes in mental status. All other systems reviewed and are otherwise negative except as noted above.  Physical Exam    Blood pressure 150/78, pulse 86, temperature 98.2 F (36.8 C), temperature source Oral, resp. rate 16, height 5\' 9"  (1.753 m), weight 341 lb 4.4 oz (154.8 kg), SpO2 100 %.  General: Pleasant, NAD Psych: Normal affect. Neuro: Alert and oriented X 3. Moves all extremities spontaneously. HEENT: Normal  Neck: Supple without bruits or JVD. Lungs:  Resp regular and unlabored, CTA. Heart: RRR no s3, s4, or murmurs. Abdomen: Soft, non-tender, non-distended, BS + x 4.  Extremities: No clubbing, cyanosis or edema. DP/PT/Radials 2+ and equal bilaterally.  Labs    Troponin [facility] Test)  Recent Labs  03/20/15 0147  TROPIPOC 0.00    Recent Labs  03/18/15 0529 03/19/15 2207 03/20/15 0830  TROPONINI <0.03 <0.03 <0.03   Lab Results  Component Value Date   WBC 6.4 03/20/2015   HGB 11.0* 03/20/2015   HCT 32.3* 03/20/2015   MCV 88.5 03/20/2015   PLT 388 03/20/2015    Recent Labs Lab 03/19/15 2207 03/20/15 0830  NA 138  --   K 3.3*  --   CL 101  --   CO2 23  --   BUN 12  --   CREATININE 0.49 0.58  CALCIUM 9.0  --   PROT  --  6.4*  BILITOT  --  0.3  ALKPHOS  --  47  ALT  --  18    AST  --  18  GLUCOSE 171*  --    Lab Results  Component Value Date   DDIMER >20.00* 03/17/2015    Radiology Studies    Dg Chest 2 View  03/19/2015  CLINICAL DATA:  50 year old female with shortness of breath EXAM: CHEST  2 VIEW COMPARISON:  Radiograph dated 03/17/2015 FINDINGS: The heart size and mediastinal contours are within normal limits. Both lungs are clear. The visualized skeletal structures are unremarkable. IMPRESSION: No active cardiopulmonary disease. Electronically Signed   By: Anner Crete M.D.   On: 03/19/2015 22:40   Dg Chest 2 View  03/17/2015  CLINICAL DATA:  Initial evaluation for acute chest pain. EXAM: CHEST  2 VIEW COMPARISON:  Prior study from 05/23/2012. FINDINGS: Cardiac and mediastinal silhouettes are stable in size and contour, and remain within normal limits. Mild elevation the right hemidiaphragm, stable. No focal infiltrate, pulmonary edema, or pleural effusion. No pneumothorax. No acute osseus abnormality. IMPRESSION: 1. Mild chronic elevation of the right hemidiaphragm, stable. 2. No other active cardiopulmonary disease. Electronically Signed   By: Jeannine Boga M.D.   On: 03/17/2015 17:11   Ct Angio Chest Pe W/cm &/or Wo Cm  03/17/2015  CLINICAL DATA:  Pt reports nausea and vomiting that began this morning along with dizziness. Rash. Pt reports she has been unable to hold anything down today. Central chest pressure. EXAM: CT ANGIOGRAPHY CHEST WITH CONTRAST TECHNIQUE: Multidetector CT imaging of the chest was performed using the standard protocol during bolus administration of intravenous contrast. Multiplanar CT image reconstructions and MIPs were obtained to evaluate the vascular anatomy. CONTRAST:  157mL OMNIPAQUE IOHEXOL 350 MG/ML SOLN COMPARISON:  Chest x-ray 03/17/2015 and chest CT 05/23/2012 FINDINGS: Heart: Heart size is normal. No imaged pericardial effusion or significant coronary artery calcifications. Vascular structures: Pulmonary arteries are  well opacified. There is no evidence for acute pulmonary embolus. Thoracic aorta is not aneurysmal. Mediastinum/thyroid: The visualized portion of the thyroid gland has a normal appearance. Small calcified mediastinal lymph nodes are identified, measuring no more than 1.5 cm in diameter. Findings are consistent with chronic granulomatous disease. No suspicious noncalcified adenopathy. Lungs/Airways: There are mild airspace filling opacities in the lungs bilaterally, predominantly at the bases consistent with small airways disease. No focal consolidations or pleural effusions. Upper abdomen: Unremarkable. Chest wall/osseous structures: Unremarkable. Review of the MIP images confirms the above findings. IMPRESSION: 1. Technically adequate exam showing no pulmonary embolus. 2. Calcified mediastinal lymph nodes are consistent chronic granulomatous disease. 3. Mild parenchymal changes consistent with small airways disease, nonspecific in appearance. Electronically Signed   By: Nolon Nations M.D.   On: 03/17/2015 19:38   US Abdomen Complete  03/20/2015  CLINICAL DATA:  Mid abdomen pain and nausea for 3 weeks EXAM: ABDOMEN ULTRASOUND COMPLETE COMPARISON:  None. FINDINGS: Gallbladder: No gallstones or wall thickening visualized. No sonographic Murphy sign noted by sonographer. Common bile duct: Diameter: 5.7 mm Liver: No focal lesion identified. There is diffuse increased echotexture of liver. IVC: No abnormality visualized. Pancreas: Visualized portion unremarkable. Spleen: Size and appearance within normal limits. Right Kidney: Length: 12 cm. Echogenicity within normal limits. No mass or hydronephrosis visualized. Left Kidney: Length: 12 cm. Echogenicity within normal limits. No mass or hydronephrosis visualized. Abdominal aorta: No aneurysm visualized. Other findings: None. IMPRESSION: Diffuse increased echotexture of the liver, nonspecific but can be seen in fatty infiltration of liver. No acute abnormality  identified. Electronically Signed   By: Abelardo Diesel M.D.   On: 03/20/2015 09:44   Acute Abdominal Series  03/17/2015  CLINICAL DATA:  Per patient centralized chest pain, nausea, and vomiting X 1 day. HX HTN, left heart catheterization, diabetes EXAM: DG ABDOMEN ACUTE W/ 1V CHEST COMPARISON:  03/17/2015 FINDINGS: No focal consolidations or pleural effusions. No free intraperitoneal air beneath diaphragm. Bowel gas pattern is nonobstructive. There is residual contrast in the bladder following recent CT exam of chest. IMPRESSION: Negative abdominal radiographs.  No acute cardiopulmonary disease. Electronically Signed   By: Nolon Nations M.D.   On: 03/17/2015 22:04    ECG & Cardiac Imaging    Regular sinus rhythm, 79, no  acute ST or T changes.  Assessment & Plan    1. Retrosternal chest pain: Patient presents with a three-week history of constant 3-10/10 retrosternal chest discomfort that is intermittently and unpredictably worse at times. She has also been noticing more dyspnea over the past 3 weeks as well as migraine headaches. She was recently observed at [facility] and was found to have normal troponins, elevated d-dimer with normal CTA of the chest, CTA of the chest that was negative for PE, and echo cardiac showed normal LV function. She was discharged home but returned to [facility] October 7 due to ongoing chest pain. Pain is not reproducible with palpation, positioning, or deep breathing. In the setting of 3 weeks of chest pain without objective evidence of ischemia, it does not appear that this is cardiac in origin. She also had a normal catheterization in 2014. Continue antacid therapy and consider switching from narcotics to NSAIDs for possible muscular skeletal chest pain.  2. Essential hypertension: Blood pressure has been elevated since admission. Given history of diabetes, she should be on ACE inhibitor therapy. Renal function is stable.  3. Migraine headaches: These have increased  in frequency and intensity over the past 3 weeks. Management per internal medicine. She says that she has outpatient follow-up with neurology scheduled.  4. Morbid obesity: She would benefit from aggressive outpatient nutritional counseling. Consider sleep study.  5. Hypokalemia: Supplement.  6.  Lipids:  LDL 77 in 2014. Given DM, she should be on at least low to mod dose statin.  LFT's recently nl.  Defer to IM/PCP.  Signed, Murray Hodgkins, NP 03/20/2015, 11:38 AM As above, patient seen and examined.  Briefly she is a 50 year old female with past medical history of hypertension, diabetes mellitus, obesity for evaluation of chest pain. Patient had cardiac catheterization in 2014 showing no coronary disease. She developed recurrent chest pressure 3 weeks ago. It has been continuous without completely resolving.  It occasionally radiates to back and stomach. The back pain increases with certain movements. Her chest pain increases with palpation. She was evaluated at [facility] with normal troponins. She had an elevated d-dimer but CTA showed no pulmonary embolus. Echocardiogram showed normal LV function.  She now is readmitted. Electrocardiogram shows no ST changes and enzymes negative. Abdominal ultrasound unremarkable.   Symptoms are not consistent with cardiac etiology. She has had an extensive evaluation previously and based on history I do not think further cardiac workup is indicated.  Would evaluate further for noncardiac causes of chest pain. We will sign off. Please call with questions. Kirk Ruths

## 2015-03-20 NOTE — Discharge Summary (Signed)
Physician Discharge Summary  Sarah Goodman [id number] DOB: April 09, 1965 DOA: 03/19/2015  PCP: Benito Mccreedy, MD  Admit date: 03/19/2015 Discharge date: 03/20/2015  Recommendations for Outpatient Follow-up:  1. Follow-up with primary care doctor in 1 week. If symptoms persist, consider referral to gastroenterology  Discharge Diagnoses:  Principal Problem:   Chest pain Active Problems:   Diabetes mellitus type II, uncontrolled (HCC)   Nausea   Morbid obesity ([place])   Type II diabetes mellitus ([place])   Discharge Condition: Stable, improved  Diet recommendation: Healthy heart  Wt Readings from Last 3 Encounters:  03/20/15 154.8 kg (341 lb 4.4 oz)  03/17/15 154.858 kg (341 lb 6.4 oz)  03/05/15 157.398 kg (347 lb)    History of present illness:  The patient is a 50 year old female with history of morbid obesity, diabetes mellitus, who presented to the emergency department because of chest pain. She had recently been admitted for observation for chest pain at which time she had a CT scan of chest which was negative for pulmonary embolism, obvious dissection, pneumonia, coronary calcifications, pericardial effusion.  Her echocardiogram demonstrated no evidence of wall motion abnormality. Since discharge, she had been noncompliant with her PPI and continued to have some epigastric pain with radiation to her back that occasionally would go upper chest with a burning sensation. Her symptoms were somewhat relieved by burping or belching and were worse at night. In the emergency permit, she was found to have elevated blood pressure and the on-call cardiologist recommended additional observation for chest pain. She had a cardiac catheterization one year prior to admission by Dr. Daneen Schick which was unremarkable.  Hospital Course:   Chest pain, unlikely to be due to acute coronary syndrome. She had a negative coronary catheterization 1 year ago. Her lack of elevated troponins, EKG changes,  and atypical nature of her chest pain limited to believe that this is not unstable angina. Most likely, her symptoms sound like they are secondary to acid reflux and indigestion. She had some improvement with GI cocktail. She was given a new prescription for higher dose PPI and some Carafate to use for the next 10 days. Recommend that she follow-up with her primary care doctor to assess whether this combination improves her symptoms. Encouraged her to try to lose a little bit of weight which may help with some of her acid reflux symptoms. Additionally, if she continues to have symptoms, would recommend gastric emptying study given her diabetes mellitus.    Elevated blood pressure which improved somewhat with IV hydralazine and nitroglycerin. Her blood pressure trended down with improvement in her pain.  BP during her recent observation a few days ago was wnl/low normal.  I did not start any routine blood pressure medications and recommended follow-up with her primary care doctor as an outpatient.  Diabetes mellitus type 2, recommend that she continue her NovoLog 70/30.  A1c 8.2, slightly high.  F/u with her PCP for ongoing management.    Normocytic anemia, chronic problem, hgb near baseline 11-13.  Recommend f/u with PCP for further work up if not already complete.     Procedures:  None  Consultations:  none  Discharge Exam: Filed Vitals:   03/20/15 0536 03/20/15 1335  BP: 150/78 158/88  Pulse: 86 75  Temp: 98.2 F (36.8 C) 98 F (36.7 C)  Resp: 16 16   Filed Vitals:   03/20/15 0359 03/20/15 0400 03/20/15 0536 03/20/15 1335  BP: 172/82 172/82 150/78 158/88  Pulse: 80 78 86 75  Temp:  98.2 F (36.8 C) 98 F (36.7 C)  TempSrc:   Oral Oral  Resp: 21 11 16 16   Height:   5\' 9"  (1.753 m)   Weight:   154.8 kg (341 lb 4.4 oz)   SpO2: 100% 100% 100% 99%    General: Obese female, NAD Cardiovascular: RRR, no mrg Respiratory: distant breath sounds, no obvious rales, rhonchi, or  wheeze ABD:  NABS, soft, ND, mild TTP in the epigastrium without rebound or guarding MSK:  Trace bilateral lower extremity pitting edema, normal tone and bulk  Discharge Instructions     Medication List    ASK your doctor about these medications        albuterol 108 (90 Base) MCG/ACT inhaler  Commonly known as:  PROVENTIL HFA;VENTOLIN HFA  Inhale 2 puffs into the lungs 3 (three) times daily as needed for wheezing or shortness of breath.     exenatide 10 MCG/0.04ML Sopn injection  Commonly known as:  BYETTA  Inject 10 mcg into the skin 2 (two) times daily with a meal.     metFORMIN 850 MG tablet  Commonly known as:  GLUCOPHAGE  Take 1 tablet (850 mg total) by mouth 2 (two) times daily with a meal.     metoCLOPramide 10 MG tablet  Commonly known as:  REGLAN  Take 1 tablet (10 mg total) by mouth every 8 (eight) hours as needed (headache).     nortriptyline 50 MG capsule  Commonly known as:  PAMELOR  2 tabs po qhs     NOVOLOG MIX 70/30 FLEXPEN (70-30) 100 UNIT/ML FlexPen  Generic drug:  insulin aspart protamine - aspart  INJECT 20 UNITS SUBCUTANEOUSLY BID     omeprazole 20 MG capsule  Commonly known as:  PRILOSEC  Take 20 mg by mouth daily          The results of significant diagnostics from this hospitalization (including imaging, microbiology, ancillary and laboratory) are listed below for reference.    Significant Diagnostic Studies: Dg Chest 2 View  03/19/2015  CLINICAL DATA:  50 year old female with shortness of breath EXAM: CHEST  2 VIEW COMPARISON:  Radiograph dated 03/17/2015 FINDINGS: The heart size and mediastinal contours are within normal limits. Both lungs are clear. The visualized skeletal structures are unremarkable. IMPRESSION: No active cardiopulmonary disease. Electronically Signed   By: Anner Crete M.D.   On: 03/19/2015 22:40   Dg Chest 2 View  03/17/2015  CLINICAL DATA:  Initial evaluation for acute chest pain. EXAM: CHEST  2 VIEW COMPARISON:   Prior study from 05/23/2012. FINDINGS: Cardiac and mediastinal silhouettes are stable in size and contour, and remain within normal limits. Mild elevation the right hemidiaphragm, stable. No focal infiltrate, pulmonary edema, or pleural effusion. No pneumothorax. No acute osseus abnormality. IMPRESSION: 1. Mild chronic elevation of the right hemidiaphragm, stable. 2. No other active cardiopulmonary disease. Electronically Signed   By: Jeannine Boga M.D.   On: 03/17/2015 17:11   Ct Angio Chest Pe W/cm &/or Wo Cm  03/17/2015  CLINICAL DATA:  Pt reports nausea and vomiting that began this morning along with dizziness. Rash. Pt reports she has been unable to hold anything down today. Central chest pressure. EXAM: CT ANGIOGRAPHY CHEST WITH CONTRAST TECHNIQUE: Multidetector CT imaging of the chest was performed using the standard protocol during bolus administration of intravenous contrast. Multiplanar CT image reconstructions and MIPs were obtained to evaluate the vascular anatomy. CONTRAST:  17mL OMNIPAQUE IOHEXOL 350 MG/ML SOLN COMPARISON:  Chest x-ray 03/17/2015 and chest  CT 05/23/2012 FINDINGS: Heart: Heart size is normal. No imaged pericardial effusion or significant coronary artery calcifications. Vascular structures: Pulmonary arteries are well opacified. There is no evidence for acute pulmonary embolus. Thoracic aorta is not aneurysmal. Mediastinum/thyroid: The visualized portion of the thyroid gland has a normal appearance. Small calcified mediastinal lymph nodes are identified, measuring no more than 1.5 cm in diameter. Findings are consistent with chronic granulomatous disease. No suspicious noncalcified adenopathy. Lungs/Airways: There are mild airspace filling opacities in the lungs bilaterally, predominantly at the bases consistent with small airways disease. No focal consolidations or pleural effusions. Upper abdomen: Unremarkable. Chest wall/osseous structures: Unremarkable. Review of the MIP  images confirms the above findings. IMPRESSION: 1. Technically adequate exam showing no pulmonary embolus. 2. Calcified mediastinal lymph nodes are consistent chronic granulomatous disease. 3. Mild parenchymal changes consistent with small airways disease, nonspecific in appearance. Electronically Signed   By: Nolon Nations M.D.   On: 03/17/2015 19:38   US Abdomen Complete  03/20/2015  CLINICAL DATA:  Mid abdomen pain and nausea for 3 weeks EXAM: ABDOMEN ULTRASOUND COMPLETE COMPARISON:  None. FINDINGS: Gallbladder: No gallstones or wall thickening visualized. No sonographic Murphy sign noted by sonographer. Common bile duct: Diameter: 5.7 mm Liver: No focal lesion identified. There is diffuse increased echotexture of liver. IVC: No abnormality visualized. Pancreas: Visualized portion unremarkable. Spleen: Size and appearance within normal limits. Right Kidney: Length: 12 cm. Echogenicity within normal limits. No mass or hydronephrosis visualized. Left Kidney: Length: 12 cm. Echogenicity within normal limits. No mass or hydronephrosis visualized. Abdominal aorta: No aneurysm visualized. Other findings: None. IMPRESSION: Diffuse increased echotexture of the liver, nonspecific but can be seen in fatty infiltration of liver. No acute abnormality identified. Electronically Signed   By: Abelardo Diesel M.D.   On: 03/20/2015 09:44   Acute Abdominal Series  03/17/2015  CLINICAL DATA:  Per patient centralized chest pain, nausea, and vomiting X 1 day. HX HTN, left heart catheterization, diabetes EXAM: DG ABDOMEN ACUTE W/ 1V CHEST COMPARISON:  03/17/2015 FINDINGS: No focal consolidations or pleural effusions. No free intraperitoneal air beneath diaphragm. Bowel gas pattern is nonobstructive. There is residual contrast in the bladder following recent CT exam of chest. IMPRESSION: Negative abdominal radiographs.  No acute cardiopulmonary disease. Electronically Signed   By: Nolon Nations M.D.   On: 03/17/2015 22:04     Microbiology: No results found for this or any previous visit (from the past 240 hour(s)).   Labs: Basic Metabolic Panel:  Recent Labs Lab 03/17/15 1639 03/18/15 0529 03/19/15 2207 03/20/15 0830  NA 131* 133* 138  --   K 4.3 4.2 3.3*  --   CL 97* 101 101  --   CO2 22 23 23   --   GLUCOSE 234* 291* 171*  --   BUN 12 10 12   --   CREATININE 0.80 0.81 0.49 0.58  CALCIUM 8.8* 8.3* 9.0  --    Liver Function Tests:  Recent Labs Lab 03/17/15 1639 03/18/15 0529 03/20/15 0830  AST 19 13* 18  ALT 22 19 18   ALKPHOS 64 52 47  BILITOT 1.4* 0.9 0.3  PROT 6.5 6.0* 6.4*  ALBUMIN 2.9* 2.5* 3.0*    Recent Labs Lab 03/17/15 1639 03/20/15 0830  LIPASE 17 20   No results for input(s): AMMONIA in the last 168 hours. CBC:  Recent Labs Lab 03/17/15 1639 03/18/15 0529 03/19/15 2207 03/20/15 0830  WBC 8.8 6.3 7.3 6.4  NEUTROABS  --  4.8 2.8  --  HGB 16.2* 13.1 11.5* 11.0*  HCT 46.2* 37.5 33.7* 32.3*  MCV 86.7 87.2 88.0 88.5  PLT 425* 375 408* 388   Cardiac Enzymes:  Recent Labs Lab 03/18/15 0529 03/19/15 2207 03/20/15 0830  TROPONINI <0.03 <0.03 <0.03   BNP: BNP (last 3 results)  Recent Labs  03/19/15 2343  BNP 69.5    ProBNP (last 3 results) No results for input(s): PROBNP in the last 8760 hours.  CBG:  Recent Labs Lab 03/18/15 1136 03/18/15 1645 03/19/15 2148 03/20/15 0728 03/20/15 1149  GLUCAP 260* 196* 153* 194* 135*    Time coordinating discharge: 35 minutes  Signed:  Raphael Fitzpatrick  [facility] 03/20/2015, 2:16 PM

## 2015-03-20 NOTE — H&P (Signed)
[facility] History and Physical  Sarah Goodman [id number] DOB: Oct 07, 1965 DOA: 03/19/2015  Referring physician: Dr. Dina Rich. PCP: Benito Mccreedy, MD  Specialists: None.  Chief Complaint: Chest pain.  HPI: Sarah Goodman is a 50 y.o. female with history of morbid obesity diabetes mellitus presents to the ER because of chest pain. Patient was just discharged 2 days ago after being admitted for observation for chest pain. At that time patient had CT and exam of the chest which was negative for PE and 2-D echocardiogram did not show any wall motion abnormality. Patient states since going on patient has persistent chest pain. Chest pain is retrosternal radiating to the back. Increases on exertion has some nausea along with it. Denies any vomiting abdominal pain or diarrhea. In the ER patient was found to have elevated blood pressure and was given hydralazine IV with nitroglycerin glycerin patch. On call cardiologist Dr. Jules Husbands was consulted by ER physician and at this time given patient's risk factors cardiologist has recommended admission for observation. Patient has had cardiac cath last year by Dr. Daneen Schick which was unremarkable.   Review of Systems: As presented in the history of presenting illness, rest negative.  Past Medical History  Diagnosis Date  . Diabetes mellitus   . Headache(784.0)     migraines  . GERD (gastroesophageal reflux disease)   . Hypertension     Patient denies, no BP med , d/c'd by MD  . Migraine   . Memory difficulty   . Positive D dimer 03/2015   Past Surgical History  Procedure Laterality Date  . Hysteroscopy w/d&c  12/18/2011    Procedure: DILATATION AND CURETTAGE /HYSTEROSCOPY;  Surgeon: Lahoma Crocker, MD;  Location: [facility];  Service: Gynecology;  Laterality: N/A;  . Colposcopy  12/18/2011    Procedure: COLPOSCOPY;  Surgeon: Lahoma Crocker, MD;  Location: [facility];  Service: Gynecology;  Laterality: N/A;  . Left heart  catheterization with coronary angiogram N/A 05/26/2012    Procedure: LEFT HEART CATHETERIZATION WITH CORONARY ANGIOGRAM;  Surgeon: Sinclair Grooms, MD;  Location: [facility] C[facility];  Service: Cardiovascular;  Laterality: N/A;   Social History:  reports that she has never smoked. She has never used smokeless tobacco. She reports that she does not drink alcohol or use illicit drugs. Where does patient live Home. Can patient participate in ADLs? Yes.  No Known Allergies  Family History:  Family History  Problem Relation Age of Onset  . Diabetes Mother   . Hypertension Mother   . CAD Mother 92    Early onset CAD, with "blockage in artery and stent in heart"      Prior to Admission medications   Medication Sig Start Date End Date Taking? Authorizing Provider  albuterol (PROVENTIL HFA;VENTOLIN HFA) 108 (90 BASE) MCG/ACT inhaler Inhale 2 puffs into the lungs 3 (three) times daily as needed for wheezing or shortness of breath.    Yes Historical Provider, MD  exenatide (BYETTA) 10 MCG/0.04ML SOPN injection Inject 10 mcg into the skin 2 (two) times daily with a meal.   Yes Historical Provider, MD  metFORMIN (GLUCOPHAGE) 850 MG tablet Take 1 tablet (850 mg total) by mouth 2 (two) times daily with a meal. 05/29/12  Yes [name], NP  metoCLOPramide (REGLAN) 10 MG tablet Take 1 tablet (10 mg total) by mouth every 8 (eight) hours as needed (headache). 03/06/15  Yes [name], MD  nortriptyline (PAMELOR) 50 MG capsule 2 tabs po qhs Patient taking differently: Take 100  mg by mouth at bedtime.  02/27/15  Yes [name], MD  NOVOLOG MIX 70/30 FLEXPEN (70-30) 100 UNIT/ML FlexPen INJECT 20 UNITS SUBCUTANEOUSLY BID 12/19/14  Yes Historical Provider, MD  omeprazole (PRILOSEC) 20 MG capsule Take 20 mg by mouth daily 02/13/15  Yes Historical Provider, MD    Physical Exam: Filed Vitals:   03/20/15 0240 03/20/15 0359 03/20/15 0400 03/20/15 0536  BP: 160/74 172/82 172/82   Pulse: 71 80 78   Temp:      TempSrc:       Resp: 20 21 11    Height:    5\' 9"  (1.753 m)  Weight:    154.8 kg (341 lb 4.4 oz)  SpO2: 98% 100% 100%      General:  Obese not in distress.  Eyes: Anicteric no pallor.  ENT: No discharge from the ears eyes nose.   Neck: No JVD appreciated.  Cardiovascular: S1 and S2 heard.  Respiratory: No rhonchi or crepitations.  Abdomen: Soft nontender bowel sounds present.  Skin: No rash.  Musculoskeletal: No edema.  Psychiatric: Appears normal.  Neurologic: Alert and awake oriented to time place and person. Moves all extremities.  Labs on Admission:  Basic Metabolic Panel:  Recent Labs Lab 03/17/15 1639 03/18/15 0529 03/19/15 2207  NA 131* 133* 138  K 4.3 4.2 3.3*  CL 97* 101 101  CO2 22 23 23   GLUCOSE 234* 291* 171*  BUN 12 10 12   CREATININE 0.80 0.81 0.49  CALCIUM 8.8* 8.3* 9.0   Liver Function Tests:  Recent Labs Lab 03/17/15 1639 03/18/15 0529  AST 19 13*  ALT 22 19  ALKPHOS 64 52  BILITOT 1.4* 0.9  PROT 6.5 6.0*  ALBUMIN 2.9* 2.5*    Recent Labs Lab 03/17/15 1639  LIPASE 17   No results for input(s): AMMONIA in the last 168 hours. CBC:  Recent Labs Lab 03/17/15 1639 03/18/15 0529 03/19/15 2207  WBC 8.8 6.3 7.3  NEUTROABS  --  4.8 2.8  HGB 16.2* 13.1 11.5*  HCT 46.2* 37.5 33.7*  MCV 86.7 87.2 88.0  PLT 425* 375 408*   Cardiac Enzymes:  Recent Labs Lab 03/18/15 0529 03/19/15 2207  TROPONINI <0.03 <0.03    BNP (last 3 results)  Recent Labs  03/19/15 2343  BNP 69.5    ProBNP (last 3 results) No results for input(s): PROBNP in the last 8760 hours.  CBG:  Recent Labs Lab 03/17/15 2220 03/18/15 0616 03/18/15 1136 03/18/15 1645 03/19/15 2148  GLUCAP 195* 255* 260* 196* 153*    Radiological Exams on Admission: Dg Chest 2 View  03/19/2015  CLINICAL DATA:  50 year old female with shortness of breath EXAM: CHEST  2 VIEW COMPARISON:  Radiograph dated 03/17/2015 FINDINGS: The heart size and mediastinal contours are  within normal limits. Both lungs are clear. The visualized skeletal structures are unremarkable. IMPRESSION: No active cardiopulmonary disease. Electronically Signed   By: Anner Crete M.D.   On: 03/19/2015 22:40    EKG: Independently reviewed. Normal sinus rhythm.  Assessment/Plan Principal Problem:   Chest pain Active Problems:   Diabetes mellitus type II, uncontrolled (HCC)   Nausea   1. Chest pain - cycle cardiac markers. Patient has had cardiac cath last year which was unremarkable. Since patient has been having persistent just been for last 3 weeks and this is the second admission in the last 1 week we will request cardiology input. 2. Elevated blood pressure - patient was given IV hydralazine 1 dose in the ER with nitroglycerin  patch. Closely follow Lopressor trends. I have placed patient on when necessary IV hydralazine. 3. Diabetes mellitus type 2 uncontrolled - continue patient's NovoLog 70/30 mix with sliding scale coverage. Closely follow CBGs. 4. Anemia - follow CBC.   DVT Prophylaxis Lovenox.  Code Status: Full code.  Family Communication: Discussed with patient.  Disposition Plan: Admit for observation.    Khrystyne Arpin N. [facility] Pager [phone].  If 7PM-7AM, please contact night-coverage [email or web address] Password [email or web address] 03/20/2015, 6:00 AM

## 2015-03-20 NOTE — Progress Notes (Signed)
Patient developed vaginal bleeding and has not had a period in several years.  Had an abnormal pap smear 1-2 years ago.  Pelvic exam (just bimanual, not with speculum) felt no masses and had some dark blood on glove.  Ordered pelvic ultrasound and B-HCG.  May need gastric emptying study or CT abd.  No obvious stones.

## 2015-03-21 ENCOUNTER — Other Ambulatory Visit: Payer: Medicaid Other

## 2015-03-21 ENCOUNTER — Observation Stay (HOSPITAL_COMMUNITY): Payer: Medicaid Other

## 2015-03-21 DIAGNOSIS — N939 Abnormal uterine and vaginal bleeding, unspecified: Secondary | ICD-10-CM

## 2015-03-21 DIAGNOSIS — R110 Nausea: Secondary | ICD-10-CM | POA: Diagnosis not present

## 2015-03-21 DIAGNOSIS — R072 Precordial pain: Secondary | ICD-10-CM | POA: Diagnosis not present

## 2015-03-21 DIAGNOSIS — K219 Gastro-esophageal reflux disease without esophagitis: Secondary | ICD-10-CM | POA: Diagnosis not present

## 2015-03-21 DIAGNOSIS — E118 Type 2 diabetes mellitus with unspecified complications: Secondary | ICD-10-CM | POA: Diagnosis not present

## 2015-03-21 LAB — CBC
HCT: 32.7 % — ABNORMAL LOW (ref 36.0–46.0)
Hemoglobin: 11.1 g/dL — ABNORMAL LOW (ref 12.0–15.0)
MCH: 30.2 pg (ref 26.0–34.0)
MCHC: 33.9 g/dL (ref 30.0–36.0)
MCV: 89.1 fL (ref 78.0–100.0)
Platelets: 397 10*3/uL (ref 150–400)
RBC: 3.67 MIL/uL — ABNORMAL LOW (ref 3.87–5.11)
RDW: 13.3 % (ref 11.5–15.5)
WBC: 6.3 10*3/uL (ref 4.0–10.5)

## 2015-03-21 LAB — URINE CULTURE

## 2015-03-21 LAB — BASIC METABOLIC PANEL
Anion gap: 8 (ref 5–15)
BUN: 7 mg/dL (ref 6–20)
CO2: 27 mmol/L (ref 22–32)
Calcium: 8.6 mg/dL — ABNORMAL LOW (ref 8.9–10.3)
Chloride: 100 mmol/L — ABNORMAL LOW (ref 101–111)
Creatinine, Ser: 0.73 mg/dL (ref 0.44–1.00)
GFR calc Af Amer: >60 mL/min (ref >60)
GFR calc non Af Amer: >60 mL/min (ref >60)
Glucose, Bld: 132 mg/dL — ABNORMAL HIGH (ref 65–99)
Potassium: 3.5 mmol/L (ref 3.5–5.1)
Sodium: 135 mmol/L (ref 135–145)

## 2015-03-21 LAB — GLUCOSE, CAPILLARY
Glucose-Capillary: 129 mg/dL — ABNORMAL HIGH (ref 65–99)
Glucose-Capillary: 100 mg/dL — ABNORMAL HIGH (ref 65–99)

## 2015-03-21 MED ORDER — HYDROCODONE-ACETAMINOPHEN 5-325 MG PO TABS: 1.0000 | ORAL_TABLET | Freq: Four times a day (QID) | ORAL | Status: DC | PRN

## 2015-03-21 MED ORDER — SUCRALFATE 1 GM/10ML PO SUSP
1.0000 g | Freq: Three times a day (TID) | ORAL | Status: DC
  Administered 2015-03-21 (×2): 1 g via ORAL
  Filled 2015-03-21 (×2): qty 10

## 2015-03-21 MED ORDER — FERROUS SULFATE 325 (65 FE) MG PO TBEC: 325.0000 mg | DELAYED_RELEASE_TABLET | Freq: Three times a day (TID) | ORAL | Status: DC

## 2015-03-21 MED ORDER — INSULIN ASPART PROT & ASPART (70-30 MIX) 100 UNIT/ML ~~LOC~~ SUSP
20.0000 [IU] | Freq: Two times a day (BID) | SUBCUTANEOUS | Status: DC
  Administered 2015-03-21: 20 [IU] via SUBCUTANEOUS

## 2015-03-21 NOTE — Discharge Summary (Addendum)
Physician Discharge Summary  Sarah Goodman [id number] DOB: 05/13/1965 DOA: 03/19/2015  PCP: Benito Mccreedy, MD  Admit date: 03/19/2015 Discharge date: 03/21/2015  Recommendations for Outpatient Follow-up:  1. Follow-up with gynecologist at already scheduled appointment in one and half weeks 2. Given prescription for iron tabs and advised to take any blood thinners or NSAIDs 3. Given prescription for twice a day PPI and Carafate 4. Follow-up with primary care doctor in 1 month or sooner as needed.  If abdominal pain recurs, consider gastric emptying study given history of diabetes versus CT scan  Discharge Diagnoses:  Principal Problem:   GERD (gastroesophageal reflux disease) Active Problems:   Abnormal uterine bleeding   Diabetes mellitus type II, uncontrolled (HCC)   Chest pain   Nausea   Morbid obesity ([place])   Type II diabetes mellitus (Sarah Goodman)   Discharge Condition: Stable, improved  Diet recommendation: Diabetic  Wt Readings from Last 3 Encounters:  03/20/15 154.8 kg (341 lb 4.4 oz)  03/17/15 154.858 kg (341 lb 6.4 oz)  03/05/15 157.398 kg (347 lb)    History of present illness:  The patient is a 50 year old female with history of morbid obesity, diabetes mellitus, who presented to the emergency department because of chest pain. She had recently been admitted for observation for chest pain at which time she had a CT scan of chest which was negative for pulmonary embolism, obvious dissection, pneumonia, coronary calcifications, pericardial effusion. Her echocardiogram demonstrated no evidence of wall motion abnormality. Since discharge, she had been noncompliant with her PPI and continued to have some epigastric pain with radiation to her back that occasionally would go upper chest with a burning sensation. Her symptoms were somewhat relieved by burping or belching and were worse at night. In the emergency permit, she was found to have elevated blood pressure and the  on-call cardiologist recommended additional observation for chest pain. She had a cardiac catheterization one year prior to admission by Dr. Daneen Schick which was unremarkable.  Hospital Course:   Chest pain, unlikely to be due to acute coronary syndrome. She had a negative coronary catheterization 1 year ago. Her lack of elevated troponins, EKG changes, and atypical nature of her chest pain limited to believe that this is not unstable angina. Most likely, her symptoms sound like they are secondary to acid reflux and indigestion. She had some improvement with GI cocktail and complete improvement after starting Carafate. She was given a new prescription for higher dose PPI and some Carafate to use for the next 10 days. Recommend that she follow-up with her primary care doctor to assess whether this combination improves her symptoms. Encouraged her to try to lose a little bit of weight which may help with some of her acid reflux symptoms. Additionally, if she continues to have symptoms, would recommend gastric emptying study given her diabetes mellitus. Finally, another thought was that she was having symptomatic nephrolithiasis and she found the past or stone and therefore had sudden improvement in her symptoms.  Elevated blood pressure which improved somewhat with IV hydralazine and nitroglycerin. Her blood pressure trended down with improvement in her pain. BP during her recent observation a few days ago was wnl/low normal. I did not start any routine blood pressure medications and recommended follow-up with her primary care doctor as an outpatient.  Diabetes mellitus type 2, recommend that she continue her NovoLog 70/30. A1c 8.2, slightly high. F/u with her PCP for ongoing management.   Normocytic anemia, chronic problem, hgb near baseline 11-13. Because  of her new vaginal bleeding, I have given a prescription for iron tabs. Recommend f/u with PCP.   New onset vaginal bleeding in a  postmenopausal patient. She stated that she had  an abnormal Pap smear previously and had to have a D&C by her gynecologist a year or 2 ago. Her urine pregnancy test was negative. Pelvic ultrasound demonstrated focal thickening of the endometrial stripe up to 13.7 mm. She will need repeat D&C by her gynecologist who is aware of these findings and is already arranged follow-up.  Procedures:  Abdominal ultrasound  Pelvic ultrasound  Consultations:  None  Discharge Exam: Filed Vitals:   03/20/15 1335 03/21/15 0531  BP: 158/88 98/63  Pulse: 75 108  Temp: 98 F (36.7 C) 98.2 F (36.8 C)  Resp: 16 17   Filed Vitals:   03/20/15 0400 03/20/15 0536 03/20/15 1335 03/21/15 0531  BP: 172/82 150/78 158/88 98/63  Pulse: 78 86 75 108  Temp:  98.2 F (36.8 C) 98 F (36.7 C) 98.2 F (36.8 C)  TempSrc:  Oral Oral Oral  Resp: 11 16 16 17   Height:  5\' 9"  (1.753 m)    Weight:  154.8 kg (341 lb 4.4 oz)    SpO2: 100% 100% 99% 99%    General: Obese female, NAD Cardiovascular: RRR, no mrg Respiratory: distant breath sounds, no obvious rales, rhonchi, or wheeze ABD: NABS, soft, ND, nontender MSK: Trace bilateral lower extremity pitting edema, normal tone and bulk  Discharge Instructions      Discharge Instructions    Call MD for:  difficulty breathing, headache or visual disturbances    Complete by:  As directed      Call MD for:  difficulty breathing, headache or visual disturbances    Complete by:  As directed      Call MD for:  extreme fatigue    Complete by:  As directed      Call MD for:  extreme fatigue    Complete by:  As directed      Call MD for:  hives    Complete by:  As directed      Call MD for:  hives    Complete by:  As directed      Call MD for:  persistant dizziness or light-headedness    Complete by:  As directed      Call MD for:  persistant dizziness or light-headedness    Complete by:  As directed      Call MD for:  persistant nausea and vomiting     Complete by:  As directed      Call MD for:  persistant nausea and vomiting    Complete by:  As directed      Call MD for:  severe uncontrolled pain    Complete by:  As directed      Call MD for:  severe uncontrolled pain    Complete by:  As directed      Call MD for:  temperature >100.4    Complete by:  As directed      Call MD for:  temperature >100.4    Complete by:  As directed      Diet Carb Modified    Complete by:  As directed      Diet Carb Modified    Complete by:  As directed      Discharge instructions    Complete by:  As directed   Please take omeprazole twice a day for the  next month and use carafate before meals and bedtime for the next ten days.  See if this reduces the frequency and severity of your pains.  If you continue to have pain, the next test I would recommend would be a stomach emptying study which can be ordered by either your primary care doctor or by a gastroenterologist.  I have referred this information to your primary care doctor who you should see in the next 1-2 weeks to discuss your symptoms again.     Increase activity slowly    Complete by:  As directed      Increase activity slowly    Complete by:  As directed             Medication List    TAKE these medications        albuterol 108 (90 Base) MCG/ACT inhaler  Commonly known as:  PROVENTIL HFA;VENTOLIN HFA  Inhale 2 puffs into the lungs 3 (three) times daily as needed for wheezing or shortness of breath.     exenatide 10 MCG/0.04ML Sopn injection  Commonly known as:  BYETTA  Inject 10 mcg into the skin 2 (two) times daily with a meal.     ferrous sulfate 325 (65 FE) MG EC tablet  Take 1 tablet (325 mg total) by mouth 3 (three) times daily with meals.     HYDROcodone-acetaminophen 5-325 MG tablet  Commonly known as:  NORCO/VICODIN  Take 1 tablet by mouth every 6 (six) hours as needed for severe pain.     metFORMIN 850 MG tablet  Commonly known as:  GLUCOPHAGE  Take 1 tablet (850 mg  total) by mouth 2 (two) times daily with a meal.     metoCLOPramide 10 MG tablet  Commonly known as:  REGLAN  Take 1 tablet (10 mg total) by mouth every 8 (eight) hours as needed (headache).     nortriptyline 50 MG capsule  Commonly known as:  PAMELOR  2 tabs po qhs     NOVOLOG MIX 70/30 FLEXPEN (70-30) 100 UNIT/ML FlexPen  Generic drug:  insulin aspart protamine - aspart  INJECT 20 UNITS SUBCUTANEOUSLY BID     omeprazole 20 MG capsule  Commonly known as:  PRILOSEC  Take 1 capsule (20 mg total) by mouth 2 (two) times daily before a meal.     sucralfate 1 GM/10ML suspension  Commonly known as:  CARAFATE  Take 10 mLs (1 g total) by mouth 4 (four) times daily -  with meals and at bedtime.       Follow-up Information    Follow up with OSEI-BONSU,GEORGE, MD. Schedule an appointment as soon as possible for a visit in 1 week.   Specialty:  Internal Medicine   Contact information:   [id number] [place] [address] [phone]       Follow up with Morene Crocker, CNM On 04/02/2015.   Specialty:  Certified Nurse Midwife   Why:  9:30AM   Contact information:   [address] [place] [address] [phone]        The results of significant diagnostics from this hospitalization (including imaging, microbiology, ancillary and laboratory) are listed below for reference.    Significant Diagnostic Studies: Dg Chest 2 View  03/19/2015  CLINICAL DATA:  50 year old female with shortness of breath EXAM: CHEST  2 VIEW COMPARISON:  Radiograph dated 03/17/2015 FINDINGS: The heart size and mediastinal contours are within normal limits. Both lungs are clear. The visualized  skeletal structures are unremarkable. IMPRESSION: No active cardiopulmonary disease. Electronically Signed   By: Anner Crete M.D.   On: 03/19/2015 22:40   Dg Chest 2 View  03/17/2015  CLINICAL DATA:  Initial evaluation for acute chest pain. EXAM: CHEST  2 VIEW COMPARISON:  Prior  study from 05/23/2012. FINDINGS: Cardiac and mediastinal silhouettes are stable in size and contour, and remain within normal limits. Mild elevation the right hemidiaphragm, stable. No focal infiltrate, pulmonary edema, or pleural effusion. No pneumothorax. No acute osseus abnormality. IMPRESSION: 1. Mild chronic elevation of the right hemidiaphragm, stable. 2. No other active cardiopulmonary disease. Electronically Signed   By: Jeannine Boga M.D.   On: 03/17/2015 17:11   Ct Angio Chest Pe W/cm &/or Wo Cm  03/17/2015  CLINICAL DATA:  Pt reports nausea and vomiting that began this morning along with dizziness. Rash. Pt reports she has been unable to hold anything down today. Central chest pressure. EXAM: CT ANGIOGRAPHY CHEST WITH CONTRAST TECHNIQUE: Multidetector CT imaging of the chest was performed using the standard protocol during bolus administration of intravenous contrast. Multiplanar CT image reconstructions and MIPs were obtained to evaluate the vascular anatomy. CONTRAST:  116mL OMNIPAQUE IOHEXOL 350 MG/ML SOLN COMPARISON:  Chest x-ray 03/17/2015 and chest CT 05/23/2012 FINDINGS: Heart: Heart size is normal. No imaged pericardial effusion or significant coronary artery calcifications. Vascular structures: Pulmonary arteries are well opacified. There is no evidence for acute pulmonary embolus. Thoracic aorta is not aneurysmal. Mediastinum/thyroid: The visualized portion of the thyroid gland has a normal appearance. Small calcified mediastinal lymph nodes are identified, measuring no more than 1.5 cm in diameter. Findings are consistent with chronic granulomatous disease. No suspicious noncalcified adenopathy. Lungs/Airways: There are mild airspace filling opacities in the lungs bilaterally, predominantly at the bases consistent with small airways disease. No focal consolidations or pleural effusions. Upper abdomen: Unremarkable. Chest wall/osseous structures: Unremarkable. Review of the MIP images  confirms the above findings. IMPRESSION: 1. Technically adequate exam showing no pulmonary embolus. 2. Calcified mediastinal lymph nodes are consistent chronic granulomatous disease. 3. Mild parenchymal changes consistent with small airways disease, nonspecific in appearance. Electronically Signed   By: Nolon Nations M.D.   On: 03/17/2015 19:38   US Abdomen Complete  03/20/2015  CLINICAL DATA:  Mid abdomen pain and nausea for 3 weeks EXAM: ABDOMEN ULTRASOUND COMPLETE COMPARISON:  None. FINDINGS: Gallbladder: No gallstones or wall thickening visualized. No sonographic Murphy sign noted by sonographer. Common bile duct: Diameter: 5.7 mm Liver: No focal lesion identified. There is diffuse increased echotexture of liver. IVC: No abnormality visualized. Pancreas: Visualized portion unremarkable. Spleen: Size and appearance within normal limits. Right Kidney: Length: 12 cm. Echogenicity within normal limits. No mass or hydronephrosis visualized. Left Kidney: Length: 12 cm. Echogenicity within normal limits. No mass or hydronephrosis visualized. Abdominal aorta: No aneurysm visualized. Other findings: None. IMPRESSION: Diffuse increased echotexture of the liver, nonspecific but can be seen in fatty infiltration of liver. No acute abnormality identified. Electronically Signed   By: Abelardo Diesel M.D.   On: 03/20/2015 09:44   US Transvaginal Non-ob  03/21/2015  CLINICAL DATA:  Vaginal bleeding.  Postmenopausal. EXAM: TRANSABDOMINAL AND TRANSVAGINAL ULTRASOUND OF PELVIS TECHNIQUE: Both transabdominal and transvaginal ultrasound examinations of the pelvis were performed. Transabdominal technique was performed for global imaging of the pelvis including uterus, ovaries, adnexal regions, and pelvic cul-de-sac. It was necessary to proceed with endovaginal exam following the transabdominal exam to visualize the endometrium and ovaries. COMPARISON:  05/04/2013 FINDINGS: Uterus Measurements: 10.1  x 6.3 x 6.7 cm. No fibroids or  other mass visualized. Endometrium Thickness: The fundal endometrium measures 6.3 mm. In the lower uterine segment, there is focal thickening of the endometrium, up to 13.7 mm. Within this portion of the endometrium there is vascularity on Doppler evaluation. Right ovary Measurements: 3.1 x 2.0 x 2.6 cm. Normal appearance/no adnexal mass. Left ovary Measurements: 2.9 x 1.2 x 1.6 cm. Normal appearance/no adnexal mass. Other findings Trace free pelvic fluid noted. Study quality is degraded by patient body habitus. IMPRESSION: 1. Focal thickening of the endometrial stripe. In the setting of post-menopausal bleeding, endometrial sampling is indicated to exclude carcinoma. If results are benign, sonohysterogram should be considered for focal lesion work-up prior to hysteroscopy. (Ref: Radiological Reasoning: Algorithmic Workup of Abnormal Vaginal Bleeding with Endovaginal Sonography and Sonohysterography. AJR 2008; [id number]) 2. Normal appearance of the ovaries. Electronically Signed   By: Nolon Nations M.D.   On: 03/21/2015 11:25   US Pelvis Complete  03/21/2015  CLINICAL DATA:  Vaginal bleeding.  Postmenopausal. EXAM: TRANSABDOMINAL AND TRANSVAGINAL ULTRASOUND OF PELVIS TECHNIQUE: Both transabdominal and transvaginal ultrasound examinations of the pelvis were performed. Transabdominal technique was performed for global imaging of the pelvis including uterus, ovaries, adnexal regions, and pelvic cul-de-sac. It was necessary to proceed with endovaginal exam following the transabdominal exam to visualize the endometrium and ovaries. COMPARISON:  05/04/2013 FINDINGS: Uterus Measurements: 10.1 x 6.3 x 6.7 cm. No fibroids or other mass visualized. Endometrium Thickness: The fundal endometrium measures 6.3 mm. In the lower uterine segment, there is focal thickening of the endometrium, up to 13.7 mm. Within this portion of the endometrium there is vascularity on Doppler evaluation. Right ovary Measurements: 3.1 x 2.0 x  2.6 cm. Normal appearance/no adnexal mass. Left ovary Measurements: 2.9 x 1.2 x 1.6 cm. Normal appearance/no adnexal mass. Other findings Trace free pelvic fluid noted. Study quality is degraded by patient body habitus. IMPRESSION: 1. Focal thickening of the endometrial stripe. In the setting of post-menopausal bleeding, endometrial sampling is indicated to exclude carcinoma. If results are benign, sonohysterogram should be considered for focal lesion work-up prior to hysteroscopy. (Ref: Radiological Reasoning: Algorithmic Workup of Abnormal Vaginal Bleeding with Endovaginal Sonography and Sonohysterography. AJR 2008; [id number]) 2. Normal appearance of the ovaries. Electronically Signed   By: Nolon Nations M.D.   On: 03/21/2015 11:25   Acute Abdominal Series  03/17/2015  CLINICAL DATA:  Per patient centralized chest pain, nausea, and vomiting X 1 day. HX HTN, left heart catheterization, diabetes EXAM: DG ABDOMEN ACUTE W/ 1V CHEST COMPARISON:  03/17/2015 FINDINGS: No focal consolidations or pleural effusions. No free intraperitoneal air beneath diaphragm. Bowel gas pattern is nonobstructive. There is residual contrast in the bladder following recent CT exam of chest. IMPRESSION: Negative abdominal radiographs.  No acute cardiopulmonary disease. Electronically Signed   By: Nolon Nations M.D.   On: 03/17/2015 22:04    Microbiology: Recent Results (from the past 240 hour(s))  Culture, Urine     Status: None   Collection Time: 03/20/15  1:51 PM  Result Value Ref Range Status   Specimen Description URINE, CLEAN CATCH  Final   Special Requests NONE  Final   Culture   Final    MULTIPLE SPECIES PRESENT, SUGGEST RECOLLECTION Performed at [facility]    Report Status 03/21/2015 FINAL  Final     Labs: Basic Metabolic Panel:  Recent Labs Lab 03/17/15 1639 03/18/15 0529 03/19/15 2207 03/20/15 0830 03/21/15 0503  NA 131* 133* 138  --  135  K 4.3 4.2 3.3*  --  3.5  CL 97* 101 101  --   100*  CO2 22 23 23   --  27  GLUCOSE 234* 291* 171*  --  132*  BUN 12 10 12   --  7  CREATININE 0.80 0.81 0.49 0.58 0.73  CALCIUM 8.8* 8.3* 9.0  --  8.6*   Liver Function Tests:  Recent Labs Lab 03/17/15 1639 03/18/15 0529 03/20/15 0830  AST 19 13* 18  ALT 22 19 18   ALKPHOS 64 52 47  BILITOT 1.4* 0.9 0.3  PROT 6.5 6.0* 6.4*  ALBUMIN 2.9* 2.5* 3.0*    Recent Labs Lab 03/17/15 1639 03/20/15 0830  LIPASE 17 20   No results for input(s): AMMONIA in the last 168 hours. CBC:  Recent Labs Lab 03/17/15 1639 03/18/15 0529 03/19/15 2207 03/20/15 0830 03/21/15 0503  WBC 8.8 6.3 7.3 6.4 6.3  NEUTROABS  --  4.8 2.8  --   --   HGB 16.2* 13.1 11.5* 11.0* 11.1*  HCT 46.2* 37.5 33.7* 32.3* 32.7*  MCV 86.7 87.2 88.0 88.5 89.1  PLT 425* 375 408* 388 397   Cardiac Enzymes:  Recent Labs Lab 03/18/15 0529 03/19/15 2207 03/20/15 0830 03/20/15 1430 03/20/15 2115  TROPONINI <0.03 <0.03 <0.03 <0.03 <0.03   BNP: BNP (last 3 results)  Recent Labs  03/19/15 2343  BNP 69.5    ProBNP (last 3 results) No results for input(s): PROBNP in the last 8760 hours.  CBG:  Recent Labs Lab 03/20/15 1149 03/20/15 1630 03/20/15 2129 03/21/15 0720 03/21/15 1157  GLUCAP 135* 129* 143* 129* 100*    Time coordinating discharge: 35 minutes  Signed:  Senai Ramnath  [facility] 03/21/2015, 6:15 PM

## 2015-03-22 ENCOUNTER — Inpatient Hospital Stay: Admit: 2015-03-22 | Payer: Medicaid Other

## 2015-03-24 ENCOUNTER — Other Ambulatory Visit: Payer: Medicaid Other

## 2015-03-29 ENCOUNTER — Ambulatory Visit
Admission: RE | Admit: 2015-03-29 | Discharge: 2015-03-29 | Disposition: A | Discharge status: Home / Self Care | Payer: Medicaid Other | Source: Ambulatory Visit | Attending: Neurology | Admitting: Neurology

## 2015-03-29 DIAGNOSIS — G43709 Chronic migraine without aura, not intractable, without status migrainosus: Secondary | ICD-10-CM | POA: Diagnosis not present

## 2015-04-01 ENCOUNTER — Telehealth: Payer: Self-pay | Admitting: Neurology

## 2015-04-01 NOTE — Telephone Encounter (Signed)
Please call patient, there was no significant abnormality on MRI of the brain, I will review detail at her next follow-up visit   IMPRESSION:  Equivocal MRI brain (without) demonstrating: 1. 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 Findings are nonspecific but can be seen in association with idiopathic intracranial hypertension. 2. Few scattered periventricular and subcortical foci of non-specific gliosis.  3. No acute findings.

## 2015-04-01 NOTE — Telephone Encounter (Signed)
Patient aware of results and will keep pending appt. 

## 2015-04-02 ENCOUNTER — Ambulatory Visit (INDEPENDENT_AMBULATORY_CARE_PROVIDER_SITE_OTHER): Payer: Medicaid Other | Admitting: Certified Nurse Midwife

## 2015-04-02 ENCOUNTER — Other Ambulatory Visit: Payer: Self-pay | Admitting: Certified Nurse Midwife

## 2015-04-02 ENCOUNTER — Encounter: Payer: Self-pay | Admitting: Certified Nurse Midwife

## 2015-04-02 VITALS — BP 129/91 | HR 95 | Temp 98.3°F | Wt 336.0 lb

## 2015-04-02 DIAGNOSIS — N950 Postmenopausal bleeding: Secondary | ICD-10-CM

## 2015-04-02 DIAGNOSIS — Z01419 Encounter for gynecological examination (general) (routine) without abnormal findings: Secondary | ICD-10-CM | POA: Diagnosis not present

## 2015-04-02 DIAGNOSIS — B356 Tinea cruris: Secondary | ICD-10-CM

## 2015-04-02 DIAGNOSIS — Z0000 Encounter for general adult medical examination without abnormal findings: Secondary | ICD-10-CM

## 2015-04-02 MED ORDER — NYSTATIN POWD: 1.0000 "application " | Freq: Three times a day (TID) | Status: DC

## 2015-04-02 NOTE — Progress Notes (Signed)
Patient ID: Sarah Goodman, female   DOB: 16-Nov-1965, 50 y.o.   MRN: [id number]   Chief Complaint  Patient presents with  . Gynecologic Exam    HPI [name] is a 50 y.o. female.  Here for f/u with annual exam and pap smear today. H/O D&C with colpo in 2013. Reports amenorrhea this last year.  Then reports having a very heavy period at the start of February when she was admitted for r/o aneurism, was on blood thinners at that time for increased D-dimer blood level.     States she has been cleared by neurology.  Has also had recent chest pain.  Discussed need for gastric bypass surgery, patient agrees.  Reviewed ultrasound results with patient, normal except for endometrial thickening noted on 03/21/15.  Patient is not sexually active.  Previously was having irregular periods, had been postmenopausal.    The fundal endometrium measures 6.3 mm. In the lower uterine segment, there is focal thickening of the endometrium, up to 13.7 mm. Within this portion of the endometrium there is vascularity on Doppler evaluation.  Discussed with patient the need for endometrial biopsy and sonohysterography.  Patient agreed with the plan.  Scheduled for sonohysterography with endometrial biopsy at [facility] due to the patients weight.     HPI  Past Medical History  Diagnosis Date  . Type II diabetes mellitus ([place])   . Headache(784.0)     migraines  . GERD (gastroesophageal reflux disease)   . Hypertension     Patient denies, no BP med , d/c'd by MD  . Migraine   . Memory difficulty   . Positive D dimer 03/2015  . Chest pain     a. 05/2012 Cath: LM nl, LAD nl, LCX nl, RCA nl, EF 65%;  b. 02/2015 Echo: EF 60-65%, no rwma, Gr 1 DD, nl LA size; c. 02/2015 CTA chest: No PE.  . Morbid obesity [facility])     Past Surgical History  Procedure Laterality Date  . Hysteroscopy w/d&c  12/18/2011    Procedure: DILATATION AND CURETTAGE /HYSTEROSCOPY;  Surgeon: Lahoma Crocker, MD;  Location: [place] [facility];   Service: Gynecology;  Laterality: N/A;  . Colposcopy  12/18/2011    Procedure: COLPOSCOPY;  Surgeon: Lahoma Crocker, MD;  Location: [place] [facility];  Service: Gynecology;  Laterality: N/A;  . Left heart catheterization with coronary angiogram N/A 05/26/2012    Procedure: LEFT HEART CATHETERIZATION WITH CORONARY ANGIOGRAM;  Surgeon: Sinclair Grooms, MD;  Location: [facility] CATH LAB;  Service: Cardiovascular;  Laterality: N/A;    Family History  Problem Relation Age of Onset  . Diabetes Mother   . Hypertension Mother   . CAD Mother 29    Early onset CAD, with "blockage in artery and stent in heart"    Social History Social History  Substance Use Topics  . Smoking status: Never Smoker   . Smokeless tobacco: Never Used  . Alcohol Use: No    No Known Allergies  Current Outpatient Prescriptions  Medication Sig Dispense Refill  . albuterol (PROVENTIL HFA;VENTOLIN HFA) 108 (90 BASE) MCG/ACT inhaler Inhale 2 puffs into the lungs 3 (three) times daily as needed for wheezing or shortness of breath.     . exenatide (BYETTA) 10 MCG/0.04ML SOPN injection Inject 10 mcg into the skin 2 (two) times daily with a meal.    . ferrous sulfate 325 (65 FE) MG EC tablet Take 1 tablet (325 mg total) by mouth 3 (three) times daily with meals. 90 tablet  0  . HYDROcodone-acetaminophen (NORCO/VICODIN) 5-325 MG tablet Take 1 tablet by mouth every 6 (six) hours as needed for severe pain. 10 tablet 0  . metFORMIN (GLUCOPHAGE) 850 MG tablet Take 1 tablet (850 mg total) by mouth 2 (two) times daily with a meal.    . metoCLOPramide (REGLAN) 10 MG tablet Take 1 tablet (10 mg total) by mouth every 8 (eight) hours as needed (headache). 10 tablet 0  . nortriptyline (PAMELOR) 50 MG capsule 2 tabs po qhs (Patient taking differently: Take 100 mg by mouth at bedtime. ) 60 capsule 11  . NOVOLOG MIX 70/30 FLEXPEN (70-30) 100 UNIT/ML FlexPen INJECT 20 UNITS SUBCUTANEOUSLY BID  3  . omeprazole (PRILOSEC) 20 MG capsule Take 1 capsule (20 mg  total) by mouth 2 (two) times daily before a meal. 60 capsule 0  . sucralfate (CARAFATE) 1 GM/10ML suspension Take 10 mLs (1 g total) by mouth 4 (four) times daily -  with meals and at bedtime. 420 mL 0   No current facility-administered medications for this visit.    Review of Systems Review of Systems Constitutional: negative for fatigue and weight loss Respiratory: negative for cough and wheezing Cardiovascular: negative for chest pain, fatigue and palpitations Gastrointestinal: + for abdominal pain and change in bowel habits Genitourinary: + postmenopausal bleeding Integument/breast: negative for nipple discharge Musculoskeletal:negative for myalgias Neurological: negative for gait problems and tremors Behavioral/Psych: negative for abusive relationship, depression Endocrine: negative for temperature intolerance     Blood pressure 129/91, pulse 95, temperature 98.3 F (36.8 C), weight 336 lb (152.409 kg).  Physical Exam Physical Exam General:   alert  Skin:   no rash or abnormalities  Lungs:   clear to auscultation bilaterally  Heart:   regular rate and rhythm, S1, S2 normal, no murmur, click, rub or gallop  Breasts:   normal without suspicious masses, skin or nipple changes or axillary nodes  Abdomen:  normal findings: no organomegaly, soft, non-tender and no hernia Difficult to assess d/t morbid obesity  Pelvis:  External genitalia: normal general appearance Urinary system: urethral meatus normal and bladder without fullness, nontender Vaginal: normal without tenderness, induration or masses Cervix: normal appearance, friable on exam Adnexa: normal bimanual exam Uterus: anteverted and non-tender, size difficult to assess d/t body habitus    50% of 30 min visit spent on counseling and coordination of care.   Data Reviewed Previous medical hx, meds, labs, ultrasound  Assessment     Endometrial thickening on [place]  Morbid obesity  Postmenopausal bleeding H/O of DM      Plan    Orders Placed This Encounter  Procedures  . Biopsy endometrial    Standing Status: Future     Number of Occurrences:      Standing Expiration Date: 04/01/2016  . SureSwab, Vaginosis/Vaginitis Plus  . [place] Sonohysterogram    Standing Status: Future     Number of Occurrences:      Standing Expiration Date: 05/30/2016    Order Specific Question:  Reason for Exam (SYMPTOM  OR DIAGNOSIS REQUIRED)    Answer:  endometrial thickning and menses postmenopausal.    Order Specific Question:  Preferred imaging location?    Answer:  [facility]  . Ambulatory referral to Gastroenterology    Referral Priority:  Routine    Referral Type:  Consultation    Referral Reason:  Specialty Services Required    Number of Visits Requested:  1   No orders of the defined types were placed in this encounter.  Possible management options include: Surgical consult for hysterectomy Follow up after sonohysterogram and biopsy.

## 2015-04-03 ENCOUNTER — Telehealth: Payer: Self-pay

## 2015-04-03 NOTE — Telephone Encounter (Signed)
gave patient the number for the [facility] Bariatric Weight Loss seminar of [phone]

## 2015-04-04 LAB — PAP, TP IMAGING W/ HPV RNA, RFLX HPV TYPE 16,18/45: HPV mRNA, High Risk: NOT DETECTED

## 2015-04-05 ENCOUNTER — Telehealth: Payer: Self-pay

## 2015-04-05 ENCOUNTER — Other Ambulatory Visit: Payer: Self-pay | Admitting: Certified Nurse Midwife

## 2015-04-05 LAB — SURESWAB, VAGINOSIS/VAGINITIS PLUS
Atopobium vaginae: NOT DETECTED Log (cells/mL)
C. albicans, DNA: NOT DETECTED
C. glabrata, DNA: NOT DETECTED
C. parapsilosis, DNA: NOT DETECTED
C. trachomatis RNA, TMA: NOT DETECTED
C. tropicalis, DNA: NOT DETECTED
Gardnerella vaginalis: NOT DETECTED Log (cells/mL)
LACTOBACILLUS SPECIES: DETECTED Log (cells/mL)
MEGASPHAERA SPECIES: NOT DETECTED Log (cells/mL)
N. gonorrhoeae RNA, TMA: NOT DETECTED
T. vaginalis RNA, QL TMA: NOT DETECTED

## 2015-04-05 NOTE — Telephone Encounter (Signed)
Called [employer] and tried online to get preauth for sonohystergram being done at [facility] - none required per Tamera Punt, 04/05/15-ref # - also called hosp to see if endometrial biopsy needed to be done as well - left vm with Vivien Rota

## 2015-04-05 NOTE — Telephone Encounter (Signed)
Per u/s dept [phone], they do not do endometrial biopsies same time as sonohysterogram at [facility]

## 2015-04-09 ENCOUNTER — Ambulatory Visit (HOSPITAL_COMMUNITY)
Admission: RE | Admit: 2015-04-09 | Discharge: 2015-04-09 | Disposition: A | Discharge status: Home / Self Care | Payer: Medicaid Other | Source: Ambulatory Visit | Attending: Certified Nurse Midwife | Admitting: Certified Nurse Midwife

## 2015-04-09 DIAGNOSIS — Z01419 Encounter for gynecological examination (general) (routine) without abnormal findings: Secondary | ICD-10-CM

## 2015-04-09 DIAGNOSIS — Z780 Asymptomatic menopausal state: Secondary | ICD-10-CM | POA: Diagnosis not present

## 2015-04-09 DIAGNOSIS — N950 Postmenopausal bleeding: Secondary | ICD-10-CM

## 2015-04-29 ENCOUNTER — Telehealth: Payer: Self-pay | Admitting: Neurology

## 2015-04-29 ENCOUNTER — Telehealth: Payer: Self-pay | Admitting: *Deleted

## 2015-04-29 ENCOUNTER — Ambulatory Visit: Payer: Medicaid Other | Admitting: Neurology

## 2015-04-29 NOTE — Telephone Encounter (Signed)
No show - follow up - called morning of appointment to cancel.

## 2015-04-29 NOTE — Telephone Encounter (Signed)
Pt called in to cancel her appt for today, due to tooth ache. I informed her that it would be considered a no show and after three our practice may dismiss her. Not sure if she hung up on me or if the call dropped, but I was unable to r/s her as asked.

## 2015-04-30 ENCOUNTER — Encounter: Payer: Self-pay | Admitting: Neurology

## 2015-06-03 ENCOUNTER — Ambulatory Visit: Payer: Medicaid Other

## 2015-06-04 ENCOUNTER — Telehealth: Payer: Self-pay | Admitting: *Deleted

## 2015-06-04 ENCOUNTER — Ambulatory Visit: Payer: Medicaid Other | Admitting: Neurology

## 2015-06-04 NOTE — Telephone Encounter (Signed)
Called answering service prior to office opening and stated she had a family member at the ED.

## 2015-06-05 ENCOUNTER — Encounter: Payer: Self-pay | Admitting: Neurology

## 2015-06-06 ENCOUNTER — Ambulatory Visit: Payer: Medicaid Other | Admitting: Certified Nurse Midwife

## 2015-06-19 ENCOUNTER — Ambulatory Visit: Payer: Medicaid Other | Admitting: Certified Nurse Midwife

## 2015-07-13 ENCOUNTER — Encounter (HOSPITAL_COMMUNITY): Payer: Self-pay | Admitting: *Deleted

## 2015-07-13 ENCOUNTER — Ambulatory Visit (HOSPITAL_COMMUNITY)
Admission: EM | Admit: 2015-07-13 | Discharge: 2015-07-13 | Disposition: A | Discharge status: Home / Self Care | Payer: Medicaid Other | Attending: Family Medicine | Admitting: Family Medicine

## 2015-07-13 DIAGNOSIS — K219 Gastro-esophageal reflux disease without esophagitis: Secondary | ICD-10-CM | POA: Insufficient documentation

## 2015-07-13 DIAGNOSIS — Z833 Family history of diabetes mellitus: Secondary | ICD-10-CM | POA: Diagnosis not present

## 2015-07-13 DIAGNOSIS — Z79899 Other long term (current) drug therapy: Secondary | ICD-10-CM | POA: Diagnosis not present

## 2015-07-13 DIAGNOSIS — Z794 Long term (current) use of insulin: Secondary | ICD-10-CM | POA: Insufficient documentation

## 2015-07-13 DIAGNOSIS — L02416 Cutaneous abscess of left lower limb: Secondary | ICD-10-CM | POA: Insufficient documentation

## 2015-07-13 DIAGNOSIS — E119 Type 2 diabetes mellitus without complications: Secondary | ICD-10-CM | POA: Diagnosis not present

## 2015-07-13 DIAGNOSIS — Z8249 Family history of ischemic heart disease and other diseases of the circulatory system: Secondary | ICD-10-CM | POA: Insufficient documentation

## 2015-07-13 DIAGNOSIS — L0291 Cutaneous abscess, unspecified: Secondary | ICD-10-CM | POA: Diagnosis present

## 2015-07-13 MED ORDER — DOXYCYCLINE HYCLATE 100 MG PO CAPS: 100.0000 mg | ORAL_CAPSULE | Freq: Two times a day (BID) | ORAL | Status: DC

## 2015-07-13 NOTE — ED Provider Notes (Signed)
CSN: [id number]     Arrival date & time 07/13/15  1645 History   First MD Initiated Contact with Patient 07/13/15 1658     Chief Complaint  Patient presents with  . Abscess   (Consider location/radiation/quality/duration/timing/severity/associated sxs/prior Treatment) Patient is a 50 y.o. female presenting with abscess. The history is provided by the patient.  Abscess Location:  Leg Leg abscess location:  L upper leg Abscess quality: fluctuance, induration, painful and redness   Red streaking: no   Duration:  2 days Progression:  Improving (self draining purulent fluid.) Chronicity:  New Context: skin injury     Past Medical History  Diagnosis Date  . Type II diabetes mellitus ([place])   . Headache(784.0)     migraines  . GERD (gastroesophageal reflux disease)   . Hypertension     Patient denies, no BP med , d/c'd by MD  . Migraine   . Memory difficulty   . Positive D dimer 03/2015  . Chest pain     a. 05/2012 Cath: LM nl, LAD nl, LCX nl, RCA nl, EF 65%;  b. 02/2015 Echo: EF 60-65%, no rwma, Gr 1 DD, nl LA size; c. 02/2015 CTA chest: No PE.  . Morbid obesity [facility])    Past Surgical History  Procedure Laterality Date  . Hysteroscopy w/d&c  12/18/2011    Procedure: DILATATION AND CURETTAGE /HYSTEROSCOPY;  Surgeon: Lahoma Crocker, MD;  Location: [place] [facility];  Service: Gynecology;  Laterality: N/A;  . Colposcopy  12/18/2011    Procedure: COLPOSCOPY;  Surgeon: Lahoma Crocker, MD;  Location: [place] [facility];  Service: Gynecology;  Laterality: N/A;  . Left heart catheterization with coronary angiogram N/A 05/26/2012    Procedure: LEFT HEART CATHETERIZATION WITH CORONARY ANGIOGRAM;  Surgeon: Sinclair Grooms, MD;  Location: [facility] CATH LAB;  Service: Cardiovascular;  Laterality: N/A;   Family History  Problem Relation Age of Onset  . Diabetes Mother   . Hypertension Mother   . CAD Mother 57    Early onset CAD, with "blockage in artery and stent in heart"   Social History  Substance Use Topics   . Smoking status: Never Smoker   . Smokeless tobacco: Never Used  . Alcohol Use: No   OB History    Gravida Para Term Preterm AB TAB SAB Ectopic Multiple Living   4 4        4      Review of Systems  Constitutional: Negative.   Skin: Positive for wound.  All other systems reviewed and are negative.   Allergies  Review of patient's allergies indicates no known allergies.  Home Medications   Prior to Admission medications   Medication Sig Start Date End Date Taking? Authorizing Provider  exenatide (BYETTA) 10 MCG/0.04ML SOPN injection Inject 10 mcg into the skin 2 (two) times daily with a meal.   Yes Historical Provider, MD  metFORMIN (GLUCOPHAGE) 850 MG tablet Take 1 tablet (850 mg total) by mouth 2 (two) times daily with a meal. 05/29/12  Yes [name], NP  nortriptyline (PAMELOR) 50 MG capsule 2 tabs po qhs Patient taking differently: Take 100 mg by mouth at bedtime.  02/27/15  Yes [name], MD  Nystatin POWD 1 application by Does not apply route 4 (four) times daily -  before meals and at bedtime. 04/02/15  Yes Rachelle A Denney, CNM  omeprazole (PRILOSEC) 20 MG capsule Take 1 capsule (20 mg total) by mouth 2 (two) times daily before a meal. 03/20/15  Yes Janece Canterbury,  MD  SITagliptin Phosphate (JANUVIA PO) Take by mouth.   Yes Historical Provider, MD  albuterol (PROVENTIL HFA;VENTOLIN HFA) 108 (90 BASE) MCG/ACT inhaler Inhale 2 puffs into the lungs 3 (three) times daily as needed for wheezing or shortness of breath.     Historical Provider, MD  doxycycline (VIBRAMYCIN) 100 MG capsule Take 1 capsule (100 mg total) by mouth 2 (two) times daily. 07/13/15   Billy Fischer, MD  ferrous sulfate 325 (65 FE) MG EC tablet Take 1 tablet (325 mg total) by mouth 3 (three) times daily with meals. 03/21/15   Janece Canterbury, MD  HYDROcodone-acetaminophen (NORCO/VICODIN) 5-325 MG tablet Take 1 tablet by mouth every 6 (six) hours as needed for severe pain. 03/21/15   Janece Canterbury, MD   metoCLOPramide (REGLAN) 10 MG tablet Take 1 tablet (10 mg total) by mouth every 8 (eight) hours as needed (headache). 03/06/15   [name], MD  NOVOLOG MIX 70/30 FLEXPEN (70-30) 100 UNIT/ML FlexPen INJECT 20 UNITS SUBCUTANEOUSLY BID 12/19/14   Historical Provider, MD  sucralfate (CARAFATE) 1 GM/10ML suspension Take 10 mLs (1 g total) by mouth 4 (four) times daily -  with meals and at bedtime. 03/20/15   Janece Canterbury, MD   Meds Ordered and Administered this Visit  Medications - No data to display  BP 132/88 mmHg  Pulse 112  Temp(Src) 99.3 F (37.4 C) (Oral)  SpO2 97% No data found.   Physical Exam  Constitutional: She is oriented to person, place, and time. She appears well-developed and well-nourished. No distress.  Musculoskeletal: She exhibits tenderness.  Neurological: She is alert and oriented to person, place, and time.  Skin: Skin is warm and dry. There is erythema.  2cm indurated lesion with sl purulent fluid expressed, cultured., wound care.  Nursing note and vitals reviewed.   ED Course  Procedures (including critical care time)  Labs Review Labs Reviewed  AEROBIC CULTURE (SUPERFICIAL SPECIMEN) (NOT AT [facility])    Imaging Review No results found.   Visual Acuity Review  Right Eye Distance:   Left Eye Distance:   Bilateral Distance:    Right Eye Near:   Left Eye Near:    Bilateral Near:         MDM   1. Abscess of left leg       Billy Fischer, MD 07/13/15 1719

## 2015-07-13 NOTE — Discharge Instructions (Signed)
Warm compress twice a day when you take the antibiotic, take all of medicine, return as needed. °

## 2015-07-13 NOTE — ED Notes (Signed)
C/O abscess to distal left buttock x 2 days.  Has been applying warm compresses and tried expressing fluid.

## 2015-07-16 LAB — AEROBIC CULTURE W GRAM STAIN (SUPERFICIAL SPECIMEN): Special Requests: NORMAL

## 2015-07-17 ENCOUNTER — Telehealth (HOSPITAL_COMMUNITY): Payer: Self-pay | Admitting: Emergency Medicine

## 2015-07-17 NOTE — ED Notes (Signed)
Called pt and notified of recent lab results from visit 6/3 Pt ID'd properly... Reports feeling better and sx have subsided  Per Dr. Bridgett Larsson,  Notes Recorded by Melony Overly, MD on 07/16/2015 at 1:01 PM Please notify patient that wound culture grew MRSA. Sensitive to abx given at [facility] visit. EH  Adv pt if sx are not getting better to return  Pt verb understanding

## 2015-07-24 ENCOUNTER — Encounter: Payer: Self-pay | Admitting: Neurology

## 2015-07-24 ENCOUNTER — Ambulatory Visit (INDEPENDENT_AMBULATORY_CARE_PROVIDER_SITE_OTHER): Payer: Medicaid Other | Admitting: Neurology

## 2015-07-24 VITALS — BP 132/92 | HR 83 | Ht 69.0 in | Wt 320.0 lb

## 2015-07-24 DIAGNOSIS — Z794 Long term (current) use of insulin: Secondary | ICD-10-CM | POA: Diagnosis not present

## 2015-07-24 DIAGNOSIS — E1165 Type 2 diabetes mellitus with hyperglycemia: Secondary | ICD-10-CM

## 2015-07-24 DIAGNOSIS — G43909 Migraine, unspecified, not intractable, without status migrainosus: Secondary | ICD-10-CM | POA: Insufficient documentation

## 2015-07-24 DIAGNOSIS — G43009 Migraine without aura, not intractable, without status migrainosus: Secondary | ICD-10-CM

## 2015-07-24 MED ORDER — SUMATRIPTAN SUCCINATE 100 MG PO TABS: 100.0000 mg | ORAL_TABLET | ORAL | Status: DC | PRN

## 2015-07-24 MED ORDER — TOPIRAMATE 25 MG PO TABS: 50.0000 mg | ORAL_TABLET | Freq: Two times a day (BID) | ORAL | Status: DC

## 2015-07-24 NOTE — Progress Notes (Signed)
Chief Complaint  Patient presents with  . Migraine    She is still having some type of headache daily.  She estimates 15-20 days per month with migraines.  She is no longer taking Inderal LA 120mg  qd because her prescription ran out.  Feels sumatriptan was only mildly helpful.  She is only taking nortiptyline 100mg  qhs.  Reports her memory is much better and she has no further concerns over it.      PATIENT: Sarah Goodman DOB: 1965-06-18  Chief Complaint  Patient presents with  . Migraine    She is still having some type of headache daily.  She estimates 15-20 days per month with migraines.  She is no longer taking Inderal LA 120mg  qd because her prescription ran out.  Feels sumatriptan was only mildly helpful.  She is only taking nortiptyline 100mg  qhs.  Reports her memory is much better and she has no further concerns over it.     HISTORICAL  Sarah Goodman is a 50 years old right-handed female, seen in refer by her primary care doctor Benito Mccreedy, MD in February 27 2015 for evaluation of frequent headaches  I have reviewed and summarized her primary care's note, she had a history of insulin-dependent diabetes, hypertension, chronic migraine, asthma, obesity, most recent glucose was 205  She reported a long-standing history of migraine since age twenties, her typical migraine are holocranial severe pounding headache with associated light noise sensitivity, she used to have headaches couple times each month, recently, she is under extreme stress, since early January 2017, she has daily moderate to severe headaches, left frontal temporal region pressure headache, with light sensitivity, 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  She was getting a prescription of nortriptyline 25 mg 3 times a day couple weeks  ago, she can tolerate the medicine, but no significant benefit notice, in addition, she is concerned about mild elevated blood pressure tachycardia, today her blood pressure is 130/80, heart rate of 90  UPDATE July 24 2015: She continue have frequent headaches, 15-20 headaches each month, she is able to tolerate propanolol 60 mg twice a day, and nortriptyline 50 mg 2 tablets a day, she is not clear about exactly medicine that she is taking, I asked her to bring her medication at next visit, her daughter is diagnosed with multiple sclerosis recently, she is stressful, Maxalt 25 mg as needed has been helpful, but sometimes she has to take up to 4 tablets in one day  We have personally reviewed MRI of the brain in February 2017, mild atrophy, supratentorium small vessel disease, partially empty sellar   REVIEW OF SYSTEMS: Full 14 system review of systems performed and notable only for  blurred vision, dizziness, headache, numbness, speech difficulty, weakness, depression  ALLERGIES: No Known Allergies  HOME MEDICATIONS: Current Outpatient Prescriptions  Medication Sig Dispense Refill  . albuterol (PROVENTIL HFA;VENTOLIN HFA) 108 (90 BASE) MCG/ACT inhaler Inhale 2 puffs into the lungs 3 (three) times daily as needed for wheezing or shortness of breath.     . doxycycline (VIBRAMYCIN) 100 MG capsule Take 1 capsule (100 mg total) by mouth 2 (two) times daily. 20 capsule 0  . ferrous sulfate 325 (65 FE) MG EC tablet Take 1 tablet (325 mg total) by mouth 3 (three) times daily with meals. 90 tablet 0  . metFORMIN (GLUCOPHAGE) 850  MG tablet Take 1 tablet (850 mg total) by mouth 2 (two) times daily with a meal.    . metoCLOPramide (REGLAN) 10 MG tablet Take 1 tablet (10 mg total) by mouth every 8 (eight) hours as needed (headache). 10 tablet 0  . nortriptyline (PAMELOR) 50 MG capsule 2 tabs po qhs (Patient taking differently: Take 100 mg by mouth at bedtime. ) 60 capsule 11  . NOVOLOG MIX 70/30 FLEXPEN  (70-30) 100 UNIT/ML FlexPen INJECT 20 UNITS SUBCUTANEOUSLY BID  3  . omeprazole (PRILOSEC) 20 MG capsule Take 1 capsule (20 mg total) by mouth 2 (two) times daily before a meal. 60 capsule 0  . SITagliptin Phosphate (JANUVIA PO) Take by mouth.    . sucralfate (CARAFATE) 1 GM/10ML suspension Take 10 mLs (1 g total) by mouth 4 (four) times daily -  with meals and at bedtime. 420 mL 0   No current facility-administered medications for this visit.    PAST MEDICAL HISTORY: Past Medical History  Diagnosis Date  . Type II diabetes mellitus ([place])   . Headache(784.0)     migraines  . GERD (gastroesophageal reflux disease)   . Hypertension     Patient denies, no BP med , d/c'd by MD  . Migraine   . Memory difficulty   . Positive D dimer 03/2015  . Chest pain     a. 05/2012 Cath: LM nl, LAD nl, LCX nl, RCA nl, EF 65%;  b. 02/2015 Echo: EF 60-65%, no rwma, Gr 1 DD, nl LA size; c. 02/2015 CTA chest: No PE.  . Morbid obesity ([place])     PAST SURGICAL HISTORY: Past Surgical History  Procedure Laterality Date  . Hysteroscopy w/d&c  12/18/2011    Procedure: DILATATION AND CURETTAGE /HYSTEROSCOPY;  Surgeon: Lahoma Crocker, MD;  Location: [place] [facility];  Service: Gynecology;  Laterality: N/A;  . Colposcopy  12/18/2011    Procedure: COLPOSCOPY;  Surgeon: Lahoma Crocker, MD;  Location: [place] [facility];  Service: Gynecology;  Laterality: N/A;  . Left heart catheterization with coronary angiogram N/A 05/26/2012    Procedure: LEFT HEART CATHETERIZATION WITH CORONARY ANGIOGRAM;  Surgeon: Sinclair Grooms, MD;  Location[facility] C[facility] LAB;  Service: Cardiovascular;  Laterality: N/A;    FAMILY HISTORY: Family History  Problem Relation Age of Onset  . Diabetes Mother   . Hypertension Mother   . CAD Mother 25    Early onset CAD, with "blockage in artery and stent in heart"    SOCIAL HISTORY:  Social History   Social History  . Marital Status: Single    Spouse Name: N/A  . Number of Children: 4  . Years of  Education: 12   Occupational History  . Unemployed    Social History Main Topics  . Smoking status: Never Smoker   . Smokeless tobacco: Never Used  . Alcohol Use: No  . Drug Use: No  . Sexual Activity: Not on file   Other Topics Concern  . Not on file   Social History Narrative   Lives at home with her 2 sons.  Grown dtr out of the house.   Right-handed.   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   Retired Quarry manager.     PHYSICAL EXAM   Filed Vitals:   07/24/15 0857  BP: 132/92  Pulse: 83  Height: 5\' 9"  (1.753 m)  Weight: 320 lb (145.151 kg)    Not recorded      Body mass index is 47.23 kg/(m^2).  PHYSICAL EXAMNIATION:  Gen: NAD, conversant, well  nourised, obese, well groomed                     Cardiovascular: Regular rate rhythm, no peripheral edema, warm, nontender. Eyes: Conjunctivae clear without exudates or hemorrhage Neck: Supple, no carotid bruise. Pulmonary: Clear to auscultation bilaterally   NEUROLOGICAL EXAM:  MENTAL STATUS: Speech:    Speech is normal; fluent and spontaneous with normal comprehension.  Cognition: MMSE 26/30, animal naming 8     Orientation to time, place and person     Recent and remote memory: she missed 3/3 recalls     Normal Attention span and concentration     Normal Language, naming, repeating,spontaneous speech     Fund of knowledge   CRANIAL NERVES: CN II: Visual fields are full to confrontation. Fundoscopic exam is normal with sharp discs and no vascular changes. Pupils are round equal and briskly reactive to light. CN III, IV, VI: extraocular movement are normal. No ptosis. CN V: Facial sensation is intact to pinprick in all 3 divisions bilaterally. Corneal responses are intact.  CN VII: Face is symmetric with normal eye closure and smile. CN VIII: Hearing is normal to rubbing fingers CN IX, X: Palate elevates symmetrically. Phonation is normal. CN XI: Head turning and shoulder shrug are intact CN XII: Tongue is midline with normal movements  and no atrophy.  MOTOR: There is no pronator drift of out-stretched arms. Muscle bulk and tone are normal. Muscle strength is normal.  REFLEXES: Reflexes are 2+ and symmetric at the biceps, triceps, knees, and ankles. Plantar responses are flexor.  SENSORY: Intact to light touch, pinprick, position sense, and vibration sense are intact in fingers and toes.  COORDINATION: Rapid alternating movements and fine finger movements are intact. There is no dysmetria on finger-to-nose and heel-knee-shin.    GAIT/STANCE: Posture is normal. Gait is steady with normal steps, base, arm swing, and turning. Heel and toe walking are normal. Tandem gait is normal.  Romberg is absent.   DIAGNOSTIC DATA (LABS, IMAGING, TESTING) - I reviewed patient records, labs, notes, testing and imaging myself where available.   ASSESSMENT AND PLAN  Sarah Goodman is a 50 y.o. female   Worsening chronic migraine headaches  Keep nortriptyline to 50 mg 2 tablets every night  Keep propanolol 60 mg twice a day  Increased Imitrex 100 mg as needed, I specifically instructed her, limit the dose to 2 tablets each day, 2 hours apart if necessary.   Add on Topamax 25 mg 2 tablets twice a day,  I educated her about potential side effect of polypharmacy treatment, advised her to bring all her medications at next visit  [name], M.D. Ph.D.  [facility] [address] [place], [place] [address] Ph: [phone] Fax: [phone]  CC: Benito Mccreedy, MD

## 2015-09-16 ENCOUNTER — Emergency Department (HOSPITAL_COMMUNITY): Payer: Medicaid Other

## 2015-09-16 ENCOUNTER — Encounter (HOSPITAL_COMMUNITY): Payer: Self-pay | Admitting: Emergency Medicine

## 2015-09-16 ENCOUNTER — Emergency Department (HOSPITAL_COMMUNITY)
Admission: EM | Admit: 2015-09-16 | Discharge: 2015-09-16 | Disposition: A | Discharge status: Home / Self Care | Payer: Medicaid Other | Attending: Emergency Medicine | Admitting: Emergency Medicine

## 2015-09-16 DIAGNOSIS — I10 Essential (primary) hypertension: Secondary | ICD-10-CM | POA: Insufficient documentation

## 2015-09-16 DIAGNOSIS — Z7984 Long term (current) use of oral hypoglycemic drugs: Secondary | ICD-10-CM | POA: Insufficient documentation

## 2015-09-16 DIAGNOSIS — E119 Type 2 diabetes mellitus without complications: Secondary | ICD-10-CM | POA: Diagnosis not present

## 2015-09-16 DIAGNOSIS — Z79899 Other long term (current) drug therapy: Secondary | ICD-10-CM | POA: Diagnosis not present

## 2015-09-16 DIAGNOSIS — Y929 Unspecified place or not applicable: Secondary | ICD-10-CM | POA: Diagnosis not present

## 2015-09-16 DIAGNOSIS — Y939 Activity, unspecified: Secondary | ICD-10-CM | POA: Diagnosis not present

## 2015-09-16 DIAGNOSIS — W231XXA Caught, crushed, jammed, or pinched between stationary objects, initial encounter: Secondary | ICD-10-CM | POA: Insufficient documentation

## 2015-09-16 DIAGNOSIS — S9032XA Contusion of left foot, initial encounter: Secondary | ICD-10-CM

## 2015-09-16 DIAGNOSIS — S99922A Unspecified injury of left foot, initial encounter: Secondary | ICD-10-CM | POA: Diagnosis present

## 2015-09-16 DIAGNOSIS — Y999 Unspecified external cause status: Secondary | ICD-10-CM | POA: Diagnosis not present

## 2015-09-16 MED ORDER — IBUPROFEN 200 MG PO TABS
400.0000 mg | ORAL_TABLET | Freq: Once | ORAL | Status: AC
  Administered 2015-09-16: 400 mg via ORAL
  Filled 2015-09-16: qty 2

## 2015-09-16 NOTE — Discharge Instructions (Signed)
It was our pleasure to provide your ER care today - we hope that you feel better.  Our radiologist indicates your xrays show no fracture or break.   Take tylenol and/or motrin as need for pain.  Follow up with your doctor in the next 1-2 weeks if symptoms fail to improve/resolve.

## 2015-09-16 NOTE — ED Triage Notes (Signed)
Pt jammed L great toe 2 days ago. Swelling has decreased since then but still having pain. Pain with weight bearing

## 2015-09-16 NOTE — ED Provider Notes (Signed)
[facility] DEPT Provider Note   CSN: [id number] Arrival date & time: 09/16/15  1016  First Provider Contact:  First MD Initiated Contact with Patient 09/16/15 1132        History   Chief Complaint Chief Complaint  Patient presents with  . Foot Injury    HPI ELADIA RAMSEUR is a 50 y.o. female.  Patient indicates a few days ago accidentally hit dorsum left foot, first and 2nd toes, on coffee table. Persistent pain since. States swelling is improved from prior. Pain moderate, worse w walking and palpation area. Skin intact. No erythema. No numbness. Denies other pain or injury.       The history is provided by the patient.  Foot Injury   Pertinent negatives include no numbness.    Past Medical History:  Diagnosis Date  . Chest pain    a. 05/2012 Cath: LM nl, LAD nl, LCX nl, RCA nl, EF 65%;  b. 02/2015 Echo: EF 60-65%, no rwma, Gr 1 DD, nl LA size; c. 02/2015 CTA chest: No PE.  Marland Kitchen GERD (gastroesophageal reflux disease)   . Headache(784.0)    migraines  . Hypertension    Patient denies, no BP med , d/c'd by MD  . Memory difficulty   . Migraine   . Morbid obesity ([place])   . Positive D dimer 03/2015  . Type II diabetes mellitus [facility])     Patient Active Problem List   Diagnosis Date Noted  . Migraine 07/24/2015  . Postmenopausal bleeding 04/02/2015  . Nausea   . Morbid obesity ([place])   . Type II diabetes mellitus ([place])   . Hive 03/18/2015  . Positive D dimer 03/18/2015  . Chest pressure 03/17/2015  . Chest pain 03/17/2015  . Chronic migraine 02/27/2015  . Dysplasia of cervix, low grade (CIN 1) 08/05/2012  . UTI (urinary tract infection) 05/24/2012  . Dehydration 05/24/2012  . GERD (gastroesophageal reflux disease) 05/24/2012  . Morbid obesity with body mass index of 50.0-59.9 in adult ([place]) 05/24/2012  . Vertigo 05/23/2012    Class: Acute  . Chest discomfort 05/23/2012    Class: Acute  . Diabetes mellitus type II, uncontrolled ([place]) 05/23/2012  . Abnormal  uterine bleeding 12/18/2011  . Low grade squamous intraepithelial lesion (LGSIL) on Pap smear 12/18/2011    Past Surgical History:  Procedure Laterality Date  . COLPOSCOPY  12/18/2011   Procedure: COLPOSCOPY;  Surgeon: Lahoma Crocker, MD;  Location: [facility];  Service: Gynecology;  Laterality: N/A;  . HYSTEROSCOPY W/D&C  12/18/2011   Procedure: DILATATION AND CURETTAGE /HYSTEROSCOPY;  Surgeon: Lahoma Crocker, MD;  Location: [facility];  Service: Gynecology;  Laterality: N/A;  . LEFT HEART CATHETERIZATION WITH CORONARY ANGIOGRAM N/A 05/26/2012   Procedure: LEFT HEART CATHETERIZATION WITH CORONARY ANGIOGRAM;  Surgeon: Sinclair Grooms, MD;  Location: [facility] CATH LAB;  Service: Cardiovascular;  Laterality: N/A;    OB History    Gravida Para Term Preterm AB Living   4 4       4    SAB TAB Ectopic Multiple Live Births           4       Home Medications    Prior to Admission medications   Medication Sig Start Date End Date Taking? Authorizing Provider  ferrous sulfate 325 (65 FE) MG EC tablet Take 1 tablet (325 mg total) by mouth 3 (three) times daily with meals. 03/21/15  Yes Janece Canterbury, MD  metFORMIN (GLUCOPHAGE) 850 MG tablet Take 1 tablet (850  mg total) by mouth 2 (two) times daily with a meal. 05/29/12  Yes [name], NP  nortriptyline (PAMELOR) 50 MG capsule 2 tabs po qhs Patient taking differently: Take 100 mg by mouth at bedtime.  02/27/15  Yes [name], MD  NOVOLOG MIX 70/30 FLEXPEN (70-30) 100 UNIT/ML FlexPen INJECT 20 UNITS SUBCUTANEOUSLY BID 12/19/14  Yes Historical Provider, MD  omeprazole (PRILOSEC) 20 MG capsule Take 1 capsule (20 mg total) by mouth 2 (two) times daily before a meal. 03/20/15  Yes Janece Canterbury, MD  SUMAtriptan (IMITREX) 100 MG tablet Take 1 tablet (100 mg total) by mouth every 2 (two) hours as needed for migraine. May repeat in 2 hours if headache persists or recurs. 07/24/15  Yes [name], MD  topiramate (TOPAMAX) 25 MG tablet Take 2 tablets (50 mg  total) by mouth 2 (two) times daily. 07/24/15  Yes [name], MD  albuterol (PROVENTIL HFA;VENTOLIN HFA) 108 (90 BASE) MCG/ACT inhaler Inhale 2 puffs into the lungs 3 (three) times daily as needed for wheezing or shortness of breath.     Historical Provider, MD  doxycycline (VIBRAMYCIN) 100 MG capsule Take 1 capsule (100 mg total) by mouth 2 (two) times daily. Patient not taking: Reported on 09/16/2015 07/13/15   Billy Fischer, MD  metoCLOPramide (REGLAN) 10 MG tablet Take 1 tablet (10 mg total) by mouth every 8 (eight) hours as needed (headache). Patient not taking: Reported on 09/16/2015 03/06/15   [name], MD  sucralfate (CARAFATE) 1 GM/10ML suspension Take 10 mLs (1 g total) by mouth 4 (four) times daily -  with meals and at bedtime. Patient not taking: Reported on 09/16/2015 03/20/15   Janece Canterbury, MD    Family History Family History  Problem Relation Age of Onset  . Diabetes Mother   . Hypertension Mother   . CAD Mother 53    Early onset CAD, with "blockage in artery and stent in heart"    Social History Social History  Substance Use Topics  . Smoking status: Never Smoker  . Smokeless tobacco: Never Used  . Alcohol use No     Allergies   Review of patient's allergies indicates no known allergies.   Review of Systems Review of Systems  Constitutional: Negative for fever.  Cardiovascular: Negative for leg swelling.  Skin: Negative for wound.  Neurological: Negative for numbness.     Physical Exam Updated Vital Signs BP 125/92 (BP Location: Left Arm)   Pulse 103   Temp 98.4 F (36.9 C) (Oral)   Resp 18   SpO2 98%   Physical Exam  Constitutional: She appears well-developed and well-nourished. No distress.  HENT:  Head: Atraumatic.  Eyes: Conjunctivae are normal. No scleral icterus.  Neck: Neck supple. No tracheal deviation present.  Cardiovascular: Normal rate.   Pulmonary/Chest: Effort normal. No respiratory distress.  Abdominal: Normal appearance.    Musculoskeletal:  Tenderness to left great and 2nd toe. Skin intact. No significant sts noted. Dp/pt 2+. Normal cap refill distally in toes. No other focal bony tenderness noted.   Neurological: She is alert.  Motor/sens grossly intact left foot and toes.   Skin: Skin is warm and dry. No rash noted. No erythema.  Psychiatric: She has a normal mood and affect.  Nursing note and vitals reviewed.    ED Treatments / Results  Labs (all labs ordered are listed, but only abnormal results are displayed) Labs Reviewed - No data to display  EKG  EKG Interpretation None  Radiology Dg Foot Complete Left  Result Date: 09/16/2015 CLINICAL DATA:  Blow to the dorsum of the left foot on a coffee table today with onset of pain. Initial encounter. EXAM: LEFT FOOT - COMPLETE 3+ VIEW COMPARISON:  Plain films left foot 01/18/2008. FINDINGS: There is no acute bony or joint abnormality. Soft tissues are unremarkable. Plantar calcaneal spur is unchanged. IMPRESSION: No acute abnormality. Electronically Signed   By: Inge Rise M.D.   On: 09/16/2015 11:04    Procedures Procedures (including critical care time)  Medications Ordered in ED Medications - No data to display   Initial Impression / Assessment and Plan / ED Course  I have reviewed the triage vital signs and the nursing notes.  Pertinent labs & imaging results that were available during my care of the patient were reviewed by me and considered in my medical decision making (see chart for details).  Clinical Course    No meds for pain pta today. Motrin po.  Discussed xrays w pt.      Final Clinical Impressions(s) / ED Diagnoses   Final diagnoses:  None    New Prescriptions New Prescriptions   No medications on file     Lajean Saver, MD 09/16/15 1137

## 2015-10-01 ENCOUNTER — Other Ambulatory Visit: Payer: Self-pay | Admitting: Internal Medicine

## 2015-10-01 DIAGNOSIS — Z1231 Encounter for screening mammogram for malignant neoplasm of breast: Secondary | ICD-10-CM

## 2015-10-09 ENCOUNTER — Ambulatory Visit: Payer: Medicaid Other | Admitting: Certified Nurse Midwife

## 2015-10-21 ENCOUNTER — Ambulatory Visit: Payer: Medicaid Other

## 2015-10-24 ENCOUNTER — Telehealth: Payer: Self-pay | Admitting: *Deleted

## 2015-10-24 ENCOUNTER — Ambulatory Visit: Payer: Medicaid Other | Admitting: Neurology

## 2015-10-24 NOTE — Telephone Encounter (Addendum)
Called answering service at 7:55am to cancel 8am appt.

## 2015-10-28 ENCOUNTER — Ambulatory Visit (INDEPENDENT_AMBULATORY_CARE_PROVIDER_SITE_OTHER): Payer: Medicaid Other | Admitting: Neurology

## 2015-10-28 ENCOUNTER — Encounter: Payer: Self-pay | Admitting: Neurology

## 2015-10-28 VITALS — BP 140/98 | HR 114 | Ht 69.0 in | Wt 316.0 lb

## 2015-10-28 DIAGNOSIS — G43009 Migraine without aura, not intractable, without status migrainosus: Secondary | ICD-10-CM | POA: Diagnosis not present

## 2015-10-28 DIAGNOSIS — Z6841 Body Mass Index (BMI) 40.0 and over, adult: Secondary | ICD-10-CM | POA: Diagnosis not present

## 2015-10-28 MED ORDER — RIZATRIPTAN BENZOATE 10 MG PO TBDP: 10.0000 mg | ORAL_TABLET | ORAL | 11 refills | Status: DC | PRN

## 2015-10-28 MED ORDER — TOPIRAMATE 100 MG PO TABS: 100.0000 mg | ORAL_TABLET | Freq: Two times a day (BID) | ORAL | 11 refills | Status: DC

## 2015-10-28 MED ORDER — ONDANSETRON 4 MG PO TBDP: 4.0000 mg | ORAL_TABLET | Freq: Three times a day (TID) | ORAL | 11 refills | Status: DC | PRN

## 2015-10-28 NOTE — Progress Notes (Signed)
Chief Complaint  Patient presents with  . Migraine    Reports she is only taking Topamax ER 200mg  at bedtime.  She estimates still having 3-6 migraines per month.  Sumatriptan has not been helpful.  She does not take any OTC medications for her pain.  She typically just lays down to rest.      PATIENT: Sarah Goodman DOB: 03/14/1965  Chief Complaint  Patient presents with  . Migraine    Reports she is only taking Topamax ER 200mg  at bedtime.  She estimates still having 3-6 migraines per month.  Sumatriptan has not been helpful.  She does not take any OTC medications for her pain.  She typically just lays down to rest.     HISTORICAL  Sarah Goodman is a 50 years old right-handed female, seen in refer by her primary care doctor Benito Mccreedy, MD in February 27 2015 for evaluation of frequent headaches  I have reviewed and summarized her primary care's note, she had a history of insulin-dependent diabetes, hypertension, chronic migraine, asthma, obesity, most recent glucose was 205  She reported a long-standing history of migraine since age twenties, her typical migraine are holocranial severe pounding headache with associated light noise sensitivity, she used to have headaches couple times each month, recently, she is under extreme stress, since early January 2017, she has daily moderate to severe headaches, left frontal temporal region pressure headache, with light sensitivity, 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  She was getting a prescription of nortriptyline 25 mg 3 times a day couple weeks ago, she can tolerate the medicine, but no significant benefit notice, in addition, she is concerned about mild elevated blood pressure tachycardia, today her blood pressure is 130/80, heart rate of 90  UPDATE July 24 2015: She continue have frequent headaches, 15-20 headaches each month, she is able to tolerate propanolol 60 mg twice a day, and nortriptyline 50 mg 2 tablets a day, she is not clear about exactly medicine that she is taking, I asked her to bring her medication at next visit, her daughter is diagnosed with multiple sclerosis recently, she is stressful, imitrex 25 mg as needed has been helpful, but sometimes she has to take up to 4 tablets in one day  We have personally reviewed MRI of the brain in February 2017, mild atrophy, supratentorium small vessel disease, partially empty sellar  UPDATE Sep 18th 2017: She continues to have about twice a week, always starting at the right upper nuchal region spreading forward, become right retro-orbital area headache, with associated light noise sensitivity, nauseous, she is able to tolerate Topamax 50 mg twice a day well, there was no significant side effect noticed  REVIEW OF SYSTEMS: Full 14 system review of systems performed and notable only for  blurred vision, dizziness,  ALLERGIES: No Known Allergies  HOME MEDICATIONS: Current Outpatient Prescriptions  Medication Sig Dispense Refill  . albuterol (PROVENTIL HFA;VENTOLIN HFA) 108 (90 BASE) MCG/ACT inhaler Inhale 2 puffs into the lungs 3 (three) times daily as needed for wheezing or shortness of breath.     . ferrous sulfate 325 (65 FE) MG EC tablet Take 1 tablet (325 mg total) by mouth 3 (three) times daily with meals. 90 tablet 0  . metFORMIN (GLUCOPHAGE) 850 MG tablet Take 1 tablet (850 mg total) by mouth 2 (two) times daily  with a meal.    . nortriptyline (PAMELOR) 50 MG capsule 2 tabs po qhs (Patient taking differently: Take 100 mg by mouth at bedtime. ) 60 capsule 11  . NOVOLOG MIX 70/30 FLEXPEN (70-30) 100 UNIT/ML FlexPen INJECT 20 UNITS SUBCUTANEOUSLY BID  3  . omeprazole (PRILOSEC) 20 MG capsule Take 1 capsule (20 mg total) by mouth 2 (two) times daily before a meal. 60 capsule 0  .  SUMAtriptan (IMITREX) 100 MG tablet Take 1 tablet (100 mg total) by mouth every 2 (two) hours as needed for migraine. May repeat in 2 hours if headache persists or recurs. 12 tablet 11  . topiramate (TOPAMAX) 25 MG tablet Take 2 tablets (50 mg total) by mouth 2 (two) times daily. 120 tablet 11   No current facility-administered medications for this visit.     PAST MEDICAL HISTORY: Past Medical History:  Diagnosis Date  . Chest pain    a. 05/2012 Cath: LM nl, LAD nl, LCX nl, RCA nl, EF 65%;  b. 02/2015 Echo: EF 60-65%, no rwma, Gr 1 DD, nl LA size; c. 02/2015 CTA chest: No PE.  Marland Kitchen GERD (gastroesophageal reflux disease)   . Headache(784.0)    migraines  . Hypertension    Patient denies, no BP med , d/c'd by MD  . Memory difficulty   . Migraine   . Morbid obesity ([place])   . Positive D dimer 03/2015  . Type II diabetes mellitus ([place])     PAST SURGICAL HISTORY: Past Surgical History:  Procedure Laterality Date  . COLPOSCOPY  12/18/2011   Procedure: COLPOSCOPY;  Surgeon: Lahoma Crocker, MD;  Location: [facility];  Service: Gynecology;  Laterality: N/A;  . HYSTEROSCOPY W/D&C  12/18/2011   Procedure: DILATATION AND CURETTAGE /HYSTEROSCOPY;  Surgeon: Lahoma Crocker, MD;  Location: [facility];  Service: Gynecology;  Laterality: N/A;  . LEFT HEART CATHETERIZATION WITH CORONARY ANGIOGRAM N/A 05/26/2012   Procedure: LEFT HEART CATHETERIZATION WITH CORONARY ANGIOGRAM;  Surgeon: Sinclair Grooms, MD;  Location: [facility] CATH LAB;  Service: Cardiovascular;  Laterality: N/A;    FAMILY HISTORY: Family History  Problem Relation Age of Onset  . Diabetes Mother   . Hypertension Mother   . CAD Mother 9    Early onset CAD, with "blockage in artery and stent in heart"    SOCIAL HISTORY:  Social History   Social History  . Marital status: Single    Spouse name: N/A  . Number of children: 4  . Years of education: 12   Occupational History  . Unemployed    Social History Main Topics  . Smoking  status: Never Smoker  . Smokeless tobacco: Never Used  . Alcohol use No  . Drug use: No  . Sexual activity: Not on file   Other Topics Concern  . Not on file   Social History Narrative   Lives at home with her 2 sons.  Grown dtr out of the house.   Right-handed.   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   Retired Quarry manager.     PHYSICAL EXAM   Vitals:   10/28/15 1513  BP: (!) 140/98  Pulse: (!) 114  Weight: (!) 316 lb (143.3 kg)  Height: 5\' 9"  (1.753 m)    Not recorded      Body mass index is 46.67 kg/m.  PHYSICAL EXAMNIATION:  Gen: NAD, conversant, well nourised, obese, well groomed                     Cardiovascular:  Regular rate rhythm, no peripheral edema, warm, nontender. Eyes: Conjunctivae clear without exudates or hemorrhage Neck: Supple, no carotid bruise. Pulmonary: Clear to auscultation bilaterally   NEUROLOGICAL EXAM:  MENTAL STATUS: Speech:    Speech is normal; fluent and spontaneous with normal comprehension.  Cognition: MMSE 26/30, animal naming 8     Orientation to time, place and person     Recent and remote memory: she missed 3/3 recalls     Normal Attention span and concentration     Normal Language, naming, repeating,spontaneous speech     Fund of knowledge   CRANIAL NERVES: CN II: Visual fields are full to confrontation. Fundoscopic exam is normal with sharp discs and no vascular changes. Pupils are round equal and briskly reactive to light. CN III, IV, VI: extraocular movement are normal. No ptosis. CN V: Facial sensation is intact to pinprick in all 3 divisions bilaterally. Corneal responses are intact.  CN VII: Face is symmetric with normal eye closure and smile. CN VIII: Hearing is normal to rubbing fingers CN IX, X: Palate elevates symmetrically. Phonation is normal. CN XI: Head turning and shoulder shrug are intact CN XII: Tongue is midline with normal movements and no atrophy.  MOTOR: There is no pronator drift of out-stretched arms. Muscle bulk and tone  are normal. Muscle strength is normal.  REFLEXES: Reflexes are 2+ and symmetric at the biceps, triceps, knees, and ankles. Plantar responses are flexor.  SENSORY: Intact to light touch, pinprick, position sense, and vibration sense are intact in fingers and toes.  COORDINATION: Rapid alternating movements and fine finger movements are intact. There is no dysmetria on finger-to-nose and heel-knee-shin.    GAIT/STANCE: Posture is normal. Gait is steady with normal steps, base, arm swing, and turning. Heel and toe walking are normal. Tandem gait is normal.  Romberg is absent.   DIAGNOSTIC DATA (LABS, IMAGING, TESTING) - I reviewed patient records, labs, notes, testing and imaging myself where available.   ASSESSMENT AND PLAN  KANITRA DICKENSON is a 50 y.o. female   Worsening chronic migraine headaches  Keep nortriptyline to 50 mg 2 tablets every night  Keep propanolol 60 mg twice a day  Increase Topamax to 100 mg twice a day  Maxalt 10 mg, Zofran 4 mg as needed for migraine headaches  [name], M.D. Ph.D.  [facility] [address] [place], [place] [address] Ph: [phone] Fax: [phone]  CC: Benito Mccreedy, MD

## 2015-12-04 ENCOUNTER — Ambulatory Visit: Payer: Medicaid Other | Admitting: Certified Nurse Midwife

## 2015-12-06 ENCOUNTER — Other Ambulatory Visit (HOSPITAL_COMMUNITY): Payer: Self-pay | Admitting: Gastroenterology

## 2015-12-06 DIAGNOSIS — R1013 Epigastric pain: Secondary | ICD-10-CM

## 2015-12-16 ENCOUNTER — Ambulatory Visit (HOSPITAL_COMMUNITY): Admission: RE | Admit: 2015-12-16 | Payer: Medicaid Other | Source: Ambulatory Visit

## 2015-12-24 ENCOUNTER — Ambulatory Visit: Payer: Medicaid Other | Admitting: Certified Nurse Midwife

## 2016-03-12 ENCOUNTER — Ambulatory Visit: Payer: Medicaid Other | Admitting: Obstetrics and Gynecology

## 2016-04-09 ENCOUNTER — Ambulatory Visit: Payer: Medicaid Other | Admitting: Obstetrics

## 2016-04-22 DIAGNOSIS — Z0271 Encounter for disability determination: Secondary | ICD-10-CM

## 2016-04-27 ENCOUNTER — Ambulatory Visit (INDEPENDENT_AMBULATORY_CARE_PROVIDER_SITE_OTHER): Payer: Medicaid Other | Admitting: Obstetrics

## 2016-04-27 ENCOUNTER — Encounter: Payer: Self-pay | Admitting: Obstetrics

## 2016-04-27 ENCOUNTER — Other Ambulatory Visit (HOSPITAL_COMMUNITY)
Admission: RE | Admit: 2016-04-27 | Discharge: 2016-04-27 | Disposition: A | Discharge status: Home / Self Care | Payer: Medicaid Other | Source: Ambulatory Visit | Attending: Obstetrics | Admitting: Obstetrics

## 2016-04-27 VITALS — BP 125/87 | HR 102 | Ht 73.0 in | Wt 315.0 lb

## 2016-04-27 DIAGNOSIS — Z01419 Encounter for gynecological examination (general) (routine) without abnormal findings: Secondary | ICD-10-CM | POA: Insufficient documentation

## 2016-04-27 DIAGNOSIS — Z113 Encounter for screening for infections with a predominantly sexual mode of transmission: Secondary | ICD-10-CM | POA: Diagnosis not present

## 2016-04-27 DIAGNOSIS — B373 Candidiasis of vulva and vagina: Secondary | ICD-10-CM | POA: Insufficient documentation

## 2016-04-27 DIAGNOSIS — B369 Superficial mycosis, unspecified: Secondary | ICD-10-CM

## 2016-04-27 DIAGNOSIS — B3731 Acute candidiasis of vulva and vagina: Secondary | ICD-10-CM

## 2016-04-27 DIAGNOSIS — Z0000 Encounter for general adult medical examination without abnormal findings: Secondary | ICD-10-CM

## 2016-04-27 DIAGNOSIS — Z1272 Encounter for screening for malignant neoplasm of vagina: Secondary | ICD-10-CM | POA: Diagnosis not present

## 2016-04-27 DIAGNOSIS — N921 Excessive and frequent menstruation with irregular cycle: Secondary | ICD-10-CM | POA: Diagnosis not present

## 2016-04-27 DIAGNOSIS — K219 Gastro-esophageal reflux disease without esophagitis: Secondary | ICD-10-CM

## 2016-04-27 DIAGNOSIS — N946 Dysmenorrhea, unspecified: Secondary | ICD-10-CM

## 2016-04-27 DIAGNOSIS — Z124 Encounter for screening for malignant neoplasm of cervix: Secondary | ICD-10-CM

## 2016-04-27 MED ORDER — OMEPRAZOLE 20 MG PO CPDR: 20.0000 mg | DELAYED_RELEASE_CAPSULE | Freq: Two times a day (BID) | ORAL | 11 refills | Status: DC

## 2016-04-27 MED ORDER — FLUCONAZOLE 200 MG PO TABS: 200.0000 mg | ORAL_TABLET | ORAL | 5 refills | Status: DC

## 2016-04-27 MED ORDER — CLOTRIMAZOLE 1 % EX CREA: 1.0000 "application " | TOPICAL_CREAM | Freq: Two times a day (BID) | CUTANEOUS | 5 refills | Status: DC

## 2016-04-27 MED ORDER — PRENATAL PLUS 27-1 MG PO TABS: 1.0000 | ORAL_TABLET | Freq: Every day | ORAL | 0 refills | Status: DC

## 2016-04-27 MED ORDER — IBUPROFEN 800 MG PO TABS: 800.0000 mg | ORAL_TABLET | Freq: Three times a day (TID) | ORAL | 5 refills | Status: DC | PRN

## 2016-04-27 NOTE — Progress Notes (Signed)
Pt presents for annual, pap, and all STD testing. Pt c/o menorrhagia with irregular cycle and dysmenorrhea.

## 2016-04-27 NOTE — Progress Notes (Signed)
Subjective:        Sarah Goodman is a 51 y.o. female here for a routine exam.  Current complaints: Irregular periods that are heavy and painful..    Personal health questionnaire:  Is patient [name], have a family history of breast and/or ovarian cancer: no Is there a family history of uterine cancer diagnosed at age < 62, gastrointestinal cancer, urinary tract cancer, family member who is a Field seismologist syndrome-associated carrier: no Is the patient overweight and hypertensive, family history of diabetes, personal history of gestational diabetes, preeclampsia or PCOS: no Is patient over 82, have PCOS,  family history of premature CHD under age 36, diabetes, smoke, have hypertension or peripheral artery disease:  no At any time, has a partner hit, kicked or otherwise hurt or frightened you?: no Over the past 2 weeks, have you felt down, depressed or hopeless?: no Over the past 2 weeks, have you felt little interest or pleasure in doing things?:no   Gynecologic History Patient's last menstrual period was 03/27/2016. Contraception: abstinence Last Pap: 2017. Results were: normal Last mammogram: 2018. Results were: normal  Obstetric History OB History  Gravida Para Term Preterm AB Living  4 4       4   SAB TAB Ectopic Multiple Live Births          4    # Outcome Date GA Lbr Len/2nd Weight Sex Delivery Anes PTL Lv  4 Para         LIV  3 Para         LIV  2 Para         LIV  1 Para         LIV      Past Medical History:  Diagnosis Date  . Chest pain    a. 05/2012 Cath: LM nl, LAD nl, LCX nl, RCA nl, EF 65%;  b. 02/2015 Echo: EF 60-65%, no rwma, Gr 1 DD, nl LA size; c. 02/2015 CTA chest: No PE.  Marland Kitchen Endometriosis   . GERD (gastroesophageal reflux disease)   . Headache(784.0)    migraines  . Hypertension    Patient denies, no BP med , d/c'd by MD  . Memory difficulty   . Migraine   . Morbid obesity ([place])   . Positive D dimer 03/2015  . Type II diabetes mellitus ([place])      Past Surgical History:  Procedure Laterality Date  . COLPOSCOPY  12/18/2011   Procedure: COLPOSCOPY;  Surgeon: Sarah Crocker, MD;  Location: [facility];  Service: Gynecology;  Laterality: N/A;  . HYSTEROSCOPY W/D&C  12/18/2011   Procedure: DILATATION AND CURETTAGE /HYSTEROSCOPY;  Surgeon: Sarah Crocker, MD;  Location: [facility];  Service: Gynecology;  Laterality: N/A;  . LEFT HEART CATHETERIZATION WITH CORONARY ANGIOGRAM N/A 05/26/2012   Procedure: LEFT HEART CATHETERIZATION WITH CORONARY ANGIOGRAM;  Surgeon: Sarah Grooms, MD;  Location: [facility] CATH [facility];  Service: Cardiovascular;  Laterality: N/A;     Current Outpatient Prescriptions:  .  albuterol (PROVENTIL HFA;VENTOLIN HFA) 108 (90 BASE) MCG/ACT inhaler, Inhale 2 puffs into the lungs 3 (three) times daily as needed for wheezing or shortness of breath. , Disp: , Rfl:  .  metFORMIN (GLUCOPHAGE) 850 MG tablet, Take 1 tablet (850 mg total) by mouth 2 (two) times daily with a meal., Disp: , Rfl:  .  NOVOLOG MIX 70/30 FLEXPEN (70-30) 100 UNIT/ML FlexPen, INJECT 20 UNITS SUBCUTANEOUSLY BID, Disp: , Rfl: 3 .  topiramate (TOPAMAX) 100 MG tablet,  Take 1 tablet (100 mg total) by mouth 2 (two) times daily., Disp: 60 tablet, Rfl: 11 .  clotrimazole (LOTRIMIN) 1 % cream, Apply 1 application topically 2 (two) times daily., Disp: 113 g, Rfl: 5 .  ferrous sulfate 325 (65 FE) MG EC tablet, Take 1 tablet (325 mg total) by mouth 3 (three) times daily with meals. (Patient not taking: Reported on 04/27/2016), Disp: 90 tablet, Rfl: 0 .  fluconazole (DIFLUCAN) 200 MG tablet, Take 1 tablet (200 mg total) by mouth every other day., Disp: 3 tablet, Rfl: 5 .  ibuprofen (ADVIL,MOTRIN) 800 MG tablet, Take 1 tablet (800 mg total) by mouth every 8 (eight) hours as needed., Disp: 30 tablet, Rfl: 5 .  LEVEMIR FLEXTOUCH 100 UNIT/ML Pen, , Disp: , Rfl: 0 .  nortriptyline (PAMELOR) 50 MG capsule, 2 tabs po qhs (Patient not taking: Reported on 04/27/2016), Disp: 60 capsule,  Rfl: 11 .  omeprazole (PRILOSEC) 20 MG capsule, Take 1 capsule (20 mg total) by mouth 2 (two) times daily before a meal., Disp: 60 capsule, Rfl: 11 .  ondansetron (ZOFRAN ODT) 4 MG disintegrating tablet, Take 1 tablet (4 mg total) by mouth every 8 (eight) hours as needed. (Patient not taking: Reported on 04/27/2016), Disp: 20 tablet, Rfl: 11 .  prenatal vitamin w/FE, FA (PRENATAL 1 + 1) 27-1 MG TABS tablet, Take 1 tablet by mouth daily at 12 noon., Disp: 30 each, Rfl: 0 .  rizatriptan (MAXALT-MLT) 10 MG disintegrating tablet, Take 1 tablet (10 mg total) by mouth as needed for migraine. May repeat in 2 hours if needed (Patient not taking: Reported on 04/27/2016), Disp: 12 tablet, Rfl: 11 No Known Allergies  Social History  Substance Use Topics  . Smoking status: Never Smoker  . Smokeless tobacco: Never Used  . Alcohol use No    Family History  Problem Relation Age of Onset  . Diabetes Mother   . Hypertension Mother   . CAD Mother 60    Early onset CAD, with "blockage in artery and stent in heart"      Review of Systems  Constitutional: negative for fatigue and weight loss Respiratory: negative for cough and wheezing Cardiovascular: negative for chest pain, fatigue and palpitations Gastrointestinal: negative for abdominal pain and change in bowel habits Musculoskeletal:negative for myalgias Neurological: negative for gait problems and tremors Behavioral/Psych: negative for abusive relationship, depression Endocrine: negative for temperature intolerance    Genitourinary:positive for irregular, heavy and painful periods Integument/breast: positive for Jock Itch / Breasts negative    Objective:       BP 125/87   Pulse (!) 102   Ht 6\' 1"  (1.854 m)   Wt (!) 315 lb (142.9 kg)   LMP 03/27/2016   BMI 41.56 kg/m  General:   alert  Skin:   no rash or abnormalities  Lungs:   clear to auscultation bilaterally  Heart:   regular rate and rhythm, S1, S2 normal, no murmur, click, rub or  gallop  Breasts:   normal without suspicious masses, skin or nipple changes or axillary nodes  Abdomen:  normal findings: no organomegaly, soft, non-tender and no hernia  Pelvis:  External genitalia: normal general appearance Urinary system: urethral meatus normal and bladder without fullness, nontender Vaginal: normal without tenderness, induration or masses Cervix: normal appearance Adnexa: normal bimanual exam Uterus: anteverted and non-tender, normal size   Lab Review Urine pregnancy test Labs reviewed yes Radiologic studies reviewed yes  50% of 20 min visit spent on counseling and coordination of  care.    Assessment:    Healthy female exam.    Perimenopause  AUB  Dysmenorrhea  Type 2 Diabetes  Morbid Obesity  Jock Itch   Plan:    Ibuprofen Rx for AUB ? Dysmenorrhea with instruction for taking during period around the clock, monthly Weight program encouraged along with exercise Clotrimazole Rx for Jock Itch  Education reviewed: calcium supplements, depression evaluation, low fat, low cholesterol diet, self breast exams and weight bearing exercise. Contraception: abstinence.  F/U in 1 year  Meds ordered this encounter  Medications  . LEVEMIR FLEXTOUCH 100 UNIT/ML Pen    Refill:  0  . prenatal vitamin w/FE, FA (PRENATAL 1 + 1) 27-1 MG TABS tablet    Sig: Take 1 tablet by mouth daily at 12 noon.    Dispense:  30 each    Refill:  0  . ibuprofen (ADVIL,MOTRIN) 800 MG tablet    Sig: Take 1 tablet (800 mg total) by mouth every 8 (eight) hours as needed.    Dispense:  30 tablet    Refill:  5  . omeprazole (PRILOSEC) 20 MG capsule    Sig: Take 1 capsule (20 mg total) by mouth 2 (two) times daily before a meal.    Dispense:  60 capsule    Refill:  11  . clotrimazole (LOTRIMIN) 1 % cream    Sig: Apply 1 application topically 2 (two) times daily.    Dispense:  113 g    Refill:  5  . fluconazole (DIFLUCAN) 200 MG tablet    Sig: Take 1 tablet (200 mg total) by mouth  every other day.    Dispense:  3 tablet    Refill:  5   Orders Placed This Encounter  Procedures  . Hepatitis C antibody  . Hepatitis B surface antigen  . HIV antibody  . RPR      Patient ID: Sarah Goodman, female   DOB: 1965-09-04, 51 y.o.   MRN: [id number]

## 2016-04-28 ENCOUNTER — Ambulatory Visit: Payer: Medicaid Other | Admitting: Adult Health

## 2016-04-28 ENCOUNTER — Telehealth: Payer: Self-pay | Admitting: Adult Health

## 2016-04-28 LAB — HIV ANTIBODY (ROUTINE TESTING W REFLEX): HIV Screen 4th Generation wRfx: NONREACTIVE

## 2016-04-28 LAB — RPR: RPR Ser Ql: NONREACTIVE

## 2016-04-28 LAB — CERVICOVAGINAL ANCILLARY ONLY
Bacterial vaginitis: NEGATIVE
Candida vaginitis: POSITIVE — AB
Chlamydia: NEGATIVE
Neisseria Gonorrhea: NEGATIVE
Trichomonas: NEGATIVE

## 2016-04-28 LAB — HEPATITIS C ANTIBODY: Hep C Virus Ab: <0.1 s/co ratio (ref 0.0–0.9)

## 2016-04-28 LAB — HEPATITIS B SURFACE ANTIGEN: Hepatitis B Surface Ag: NEGATIVE

## 2016-04-28 NOTE — Telephone Encounter (Signed)
Today is the third no show in the last year. Would you like to dismiss this patient?

## 2016-04-28 NOTE — Telephone Encounter (Signed)
Noted  

## 2016-04-28 NOTE — Telephone Encounter (Signed)
Angie- This patient has had 3 no shows. Per policy we will dismiss the patient.

## 2016-04-28 NOTE — Telephone Encounter (Signed)
It is Ok to dismiss

## 2016-04-28 NOTE — Telephone Encounter (Signed)
Pt called this morning says she has been up all night with her sick granddaughter and cannot keep her eyes open to drive to the appt today. Pt was advised this is the 4th no show in year, the appt could not be r/s at this time and a msg will be sent to NP as she will determine the status of the any further f/u appts.

## 2016-04-29 ENCOUNTER — Encounter: Payer: Self-pay | Admitting: Neurology

## 2016-04-29 ENCOUNTER — Encounter: Payer: Self-pay | Admitting: Adult Health

## 2016-04-29 ENCOUNTER — Other Ambulatory Visit: Payer: Self-pay | Admitting: Obstetrics

## 2016-05-01 ENCOUNTER — Other Ambulatory Visit: Payer: Self-pay | Admitting: Obstetrics

## 2016-05-01 DIAGNOSIS — Z01419 Encounter for gynecological examination (general) (routine) without abnormal findings: Secondary | ICD-10-CM

## 2016-05-01 LAB — CYTOLOGY - PAP
Diagnosis: NEGATIVE
HPV: NOT DETECTED

## 2016-05-01 MED ORDER — PRENATAL PLUS 27-1 MG PO TABS: 1.0000 | ORAL_TABLET | Freq: Every day | ORAL | 11 refills | Status: DC

## 2016-05-20 ENCOUNTER — Encounter (HOSPITAL_COMMUNITY): Payer: Self-pay

## 2016-05-20 ENCOUNTER — Emergency Department (HOSPITAL_COMMUNITY): Payer: Medicaid Other

## 2016-05-20 ENCOUNTER — Emergency Department (HOSPITAL_COMMUNITY)
Admission: EM | Admit: 2016-05-20 | Discharge: 2016-05-21 | Disposition: A | Discharge status: Home / Self Care | Payer: Medicaid Other | Attending: Emergency Medicine | Admitting: Emergency Medicine

## 2016-05-20 DIAGNOSIS — I10 Essential (primary) hypertension: Secondary | ICD-10-CM | POA: Insufficient documentation

## 2016-05-20 DIAGNOSIS — R0789 Other chest pain: Secondary | ICD-10-CM | POA: Diagnosis not present

## 2016-05-20 DIAGNOSIS — Z79899 Other long term (current) drug therapy: Secondary | ICD-10-CM | POA: Diagnosis not present

## 2016-05-20 DIAGNOSIS — Z794 Long term (current) use of insulin: Secondary | ICD-10-CM | POA: Diagnosis not present

## 2016-05-20 DIAGNOSIS — E119 Type 2 diabetes mellitus without complications: Secondary | ICD-10-CM | POA: Diagnosis not present

## 2016-05-20 DIAGNOSIS — R1011 Right upper quadrant pain: Secondary | ICD-10-CM | POA: Insufficient documentation

## 2016-05-20 DIAGNOSIS — R1010 Upper abdominal pain, unspecified: Secondary | ICD-10-CM

## 2016-05-20 LAB — CBC
HCT: 37.8 % (ref 36.0–46.0)
Hemoglobin: 13.2 g/dL (ref 12.0–15.0)
MCH: 30 pg (ref 26.0–34.0)
MCHC: 34.9 g/dL (ref 30.0–36.0)
MCV: 85.9 fL (ref 78.0–100.0)
Platelets: 428 10*3/uL — ABNORMAL HIGH (ref 150–400)
RBC: 4.4 MIL/uL (ref 3.87–5.11)
RDW: 12.8 % (ref 11.5–15.5)
WBC: 9.8 10*3/uL (ref 4.0–10.5)

## 2016-05-20 LAB — I-STAT TROPONIN, ED: Troponin i, poc: 0 ng/mL (ref 0.00–0.08)

## 2016-05-20 MED ORDER — ONDANSETRON HCL 4 MG/2ML IJ SOLN
4.0000 mg | Freq: Once | INTRAMUSCULAR | Status: AC
  Administered 2016-05-20: 4 mg via INTRAVENOUS
  Filled 2016-05-20: qty 2

## 2016-05-20 MED ORDER — PANTOPRAZOLE SODIUM 40 MG IV SOLR
40.0000 mg | Freq: Once | INTRAVENOUS | Status: AC
  Administered 2016-05-20: 40 mg via INTRAVENOUS
  Filled 2016-05-20: qty 40

## 2016-05-20 MED ORDER — MORPHINE SULFATE (PF) 4 MG/ML IV SOLN
4.0000 mg | Freq: Once | INTRAVENOUS | Status: DC
  Filled 2016-05-20: qty 1

## 2016-05-20 NOTE — ED Triage Notes (Signed)
Pt reports that for the past two days she has been having central CP that goes up into her throat, along with a migraine and dizziness and some nausea.

## 2016-05-20 NOTE — ED Notes (Signed)
Patient transported to Ultrasound 

## 2016-05-20 NOTE — ED Provider Notes (Signed)
By signing my name below, I, Avnee Patel, attest that this documentation has been prepared under the direction and in the presence of [place], DO  Electronically Signed: Delton [place], ED Scribe. 05/20/16. 11:35 PM.  TIME SEEN: 11:18 PM  CHIEF COMPLAINT:  Chief Complaint  Patient presents with  . Chest Pain   HPI:   Sarah Goodman is a 51 y.o. female, with a PMHx of GERD, HTN, DM and PSHx of Negative cardiac catheterization in 2014, who presents to the Emergency Department complaining of acute onset, intermittent chest and abdominal pain x 2 days. She describes her pain as a pressure. Pt notes her pain radiates through her back between her shoulder blades. Her pain is intermittently better when she burps. Pt also reports vomiting, cold symptoms (cough, says she was recently diagnosed with bronchitis) for a few weeks and dizziness. She notes her GI symptoms initially began about a year ago. Pt was seen by a GI doctor for her symptoms who performed an endoscopy and found a "small hernia" and increased her omeprazole. She states she was told her she needed a ultrasound of her gallbladder but pt notes she has not had one done.  She notes she has been using ibuprofen daily prescribed by her OBGYN. Pt denies pain exacerbation after eating or with exertion. Not pleuritic.  No other associated symptoms noted.    Patient thinks she has had a negative stress test in the last year but I cannot see that in our records. It appears last cardiac catheterization was in our system was in 2014 and showed no coronary artery disease. Had an echocardiogram and a CTA of her chest in Feb. 2017 and both were unremarkable.  PCP: Benito Mccreedy, MD  GI: [facility]   ROS: See HPI Constitutional: no fever  Eyes: no drainage  ENT: no runny nose   Cardiovascular:  + chest pain  Resp: no SOB  GI: +vomiting GU: no dysuria Integumentary: no rash  Allergy: no hives  Musculoskeletal: no leg swelling   Neurological: no slurred speech ROS otherwise negative  PAST MEDICAL HISTORY/PAST SURGICAL HISTORY:  Past Medical History:  Diagnosis Date  . Chest pain    a. 05/2012 Cath: LM nl, LAD nl, LCX nl, RCA nl, EF 65%;  b. 02/2015 Echo: EF 60-65%, no rwma, Gr 1 DD, nl LA size; c. 02/2015 CTA chest: No PE.  Marland Kitchen Endometriosis   . GERD (gastroesophageal reflux disease)   . Headache(784.0)    migraines  . Hypertension    Patient denies, no BP med , d/c'd by MD  . Memory difficulty   . Migraine   . Morbid obesity ([place])   . Positive D dimer 03/2015  . Type II diabetes mellitus ([facility])     MEDICATIONS:  Prior to Admission medications   Medication Sig Start Date End Date Taking? Authorizing Provider  albuterol (PROVENTIL HFA;VENTOLIN HFA) 108 (90 BASE) MCG/ACT inhaler Inhale 2 puffs into the lungs 3 (three) times daily as needed for wheezing or shortness of breath.     Historical Provider, MD  clotrimazole (LOTRIMIN) 1 % cream Apply 1 application topically 2 (two) times daily. 04/27/16   Shelly Bombard, MD  ferrous sulfate 325 (65 FE) MG EC tablet Take 1 tablet (325 mg total) by mouth 3 (three) times daily with meals. Patient not taking: Reported on 04/27/2016 03/21/15   Janece Canterbury, MD  fluconazole (DIFLUCAN) 200 MG tablet Take 1 tablet (200 mg total) by mouth every other day. 04/27/16  Shelly Bombard, MD  ibuprofen (ADVIL,MOTRIN) 800 MG tablet Take 1 tablet (800 mg total) by mouth every 8 (eight) hours as needed. 04/27/16   Shelly Bombard, MD  LEVEMIR FLEXTOUCH 100 UNIT/ML Pen  03/10/16   Historical Provider, MD  metFORMIN (GLUCOPHAGE) 850 MG tablet Take 1 tablet (850 mg total) by mouth 2 (two) times daily with a meal. 05/29/12   [name], NP  nortriptyline (PAMELOR) 50 MG capsule 2 tabs po qhs Patient not taking: Reported on 04/27/2016 02/27/15   [name], MD  NOVOLOG MIX 70/30 FLEXPEN (70-30) 100 UNIT/ML FlexPen INJECT 20 UNITS SUBCUTANEOUSLY BID 12/19/14   Historical Provider, MD   omeprazole (PRILOSEC) 20 MG capsule Take 1 capsule (20 mg total) by mouth 2 (two) times daily before a meal. 04/27/16   Shelly Bombard, MD  ondansetron (ZOFRAN ODT) 4 MG disintegrating tablet Take 1 tablet (4 mg total) by mouth every 8 (eight) hours as needed. Patient not taking: Reported on 04/27/2016 10/28/15   [name], MD  prenatal vitamin w/FE, FA (PRENATAL 1 + 1) 27-1 MG TABS tablet Take 1 tablet by mouth daily before breakfast. 05/01/16   Shelly Bombard, MD  rizatriptan (MAXALT-MLT) 10 MG disintegrating tablet Take 1 tablet (10 mg total) by mouth as needed for migraine. May repeat in 2 hours if needed Patient not taking: Reported on 04/27/2016 10/28/15   [name], MD  topiramate (TOPAMAX) 100 MG tablet Take 1 tablet (100 mg total) by mouth 2 (two) times daily. 10/28/15   [name], MD    ALLERGIES:  No Known Allergies  SOCIAL HISTORY:  Social History  Substance Use Topics  . Smoking status: Never Smoker  . Smokeless tobacco: Never Used  . Alcohol use No    FAMILY HISTORY: Family History  Problem Relation Age of Onset  . Diabetes Mother   . Hypertension Mother   . CAD Mother 43    Early onset CAD, with "blockage in artery and stent in heart"    EXAM: BP 139/85   Pulse 68   Temp 98.5 F (36.9 C) (Oral)   Resp 14   SpO2 100%  CONSTITUTIONAL: Alert and oriented and responds appropriately to questions. Well-appearing; well-nourished, Obese HEAD: Normocephalic EYES: Conjunctivae clear, pupils appear equal, EOMI ENT: normal nose; moist mucous membranes NECK: Supple, no meningismus, no nuchal rigidity, no LAD  CARD: RRR; S1 and S2 appreciated; no murmurs, no clicks, no rubs, no gallops RESP: Normal chest excursion without splinting or tachypnea; breath sounds clear and equal bilaterally; no wheezes, no rhonchi, no rales, no hypoxia or respiratory distress, speaking full sentences ABD/GI: Normal bowel sounds; non-distended; soft, Tenderness throughout epigastric region and  RUQ. Negative Murphy's sign , no rebound, no guarding, no peritoneal signs, no hepatosplenomegaly BACK:  The back appears normal and is non-tender to palpation, there is no CVA tenderness EXT: Normal ROM in all joints; non-tender to palpation; no edema; normal capillary refill; no cyanosis, no calf tenderness or swelling    SKIN: Normal color for age and race; warm; no rash NEURO: Moves all extremities equally PSYCH: The patient's mood and manner are appropriate. Grooming and personal hygiene are appropriate.  MEDICAL DECISION MAKING: Patient here with chest pain, abdominal pain. States worse in the past couple of days but sounds ago has actually been present for 1 year intermittently. Sounds very atypical for ACS. First negative. EKG shows no ischemic abnormality. Chest x-ray clear. Doubt PE or dissection. The gastritis but I am unable  to see the results of her recent endoscopy. She denies history of peptic ulcer disease. We'll also obtain right upper quadrant ultrasound to rule out gallbladder pathology. Lateral LFTs, lipase. We'll give morphine, Zofran, Protonix and reassess.  ED PROGRESS: Patient LFTs, lipase, second troponin negative. Reports feeling better after GI cocktail. We will quadrant ultrasound also normal. Unable to see results of patient's previous endoscopy but this could be gastritis, hiatal hernia, GERD. Have recommended she continue her omeprazole and will discharge with Carafate and Zofran to take as needed. Recommend close GI follow-up. I do not think this is ACS, dissection, PE. I feel she is safe for discharge and she is also comfortable with this plan.  At this time, I do not feel there is any life-threatening condition present. I have reviewed and discussed all results (EKG, imaging, lab, urine as appropriate) and exam findings with patient/family. I have reviewed nursing notes and appropriate previous records.  I feel the patient is safe to be discharged home without further  emergent workup and can continue workup as an outpatient as needed. Discussed usual and customary return precautions. Patient/family verbalize understanding and are comfortable with this plan.  Outpatient follow-up has been provided if needed. All questions have been answered.      EKG Interpretation  Date/Time:  Wednesday May 20 2016 22:07:52 EDT Ventricular Rate:  73 PR Interval:  164 QRS Duration: 76 QT Interval:  376 QTC Calculation: 414 R Axis:   44 Text Interpretation:  Normal sinus rhythm Normal ECG Normal sinus rhythm No significant change since last tracing Confirmed by Gerald Leitz ([id number]) on 05/20/2016 10:54:47 PM       I personally performed the services described in this documentation, which was scribed in my presence. The recorded information has been reviewed and is accurate.     [place], DO 05/21/16 [phone]

## 2016-05-21 LAB — BASIC METABOLIC PANEL
Anion gap: 7 (ref 5–15)
BUN: 6 mg/dL (ref 6–20)
CO2: 25 mmol/L (ref 22–32)
Calcium: 9.2 mg/dL (ref 8.9–10.3)
Chloride: 100 mmol/L — ABNORMAL LOW (ref 101–111)
Creatinine, Ser: 0.72 mg/dL (ref 0.44–1.00)
GFR calc Af Amer: >60 mL/min (ref >60)
GFR calc non Af Amer: >60 mL/min (ref >60)
Glucose, Bld: 136 mg/dL — ABNORMAL HIGH (ref 65–99)
Potassium: 3.3 mmol/L — ABNORMAL LOW (ref 3.5–5.1)
Sodium: 132 mmol/L — ABNORMAL LOW (ref 135–145)

## 2016-05-21 LAB — HEPATIC FUNCTION PANEL
ALT: 20 U/L (ref 14–54)
AST: 22 U/L (ref 15–41)
Albumin: 3.4 g/dL — ABNORMAL LOW (ref 3.5–5.0)
Alkaline Phosphatase: 59 U/L (ref 38–126)
Bilirubin, Direct: <0.1 mg/dL — ABNORMAL LOW (ref 0.1–0.5)
Total Bilirubin: 0.8 mg/dL (ref 0.3–1.2)
Total Protein: 7.8 g/dL (ref 6.5–8.1)

## 2016-05-21 LAB — I-STAT TROPONIN, ED: Troponin i, poc: 0 ng/mL (ref 0.00–0.08)

## 2016-05-21 LAB — LIPASE, BLOOD: Lipase: <10 U/L — ABNORMAL LOW (ref 11–51)

## 2016-05-21 MED ORDER — GI COCKTAIL ~~LOC~~
30.0000 mL | Freq: Once | ORAL | Status: AC
  Administered 2016-05-21: 30 mL via ORAL
  Filled 2016-05-21: qty 30

## 2016-05-21 MED ORDER — SUCRALFATE 1 GM/10ML PO SUSP: 1.0000 g | Freq: Three times a day (TID) | ORAL | 0 refills | Status: DC

## 2016-05-21 MED ORDER — ONDANSETRON 4 MG PO TBDP: 4.0000 mg | ORAL_TABLET | Freq: Three times a day (TID) | ORAL | 0 refills | Status: DC | PRN

## 2016-05-21 NOTE — Discharge Instructions (Signed)
Please avoid NSAIDs such as aspirin (Goody powders), ibuprofen (Motrin, Advil), naproxen (Aleve) as these may worsen your symptoms.  Tylenol 1000 mg every 6 hours is safe to take as long as you have no history of liver problems (heavy alcohol use, cirrhosis, hepatitis).  Please avoid spicy, acidic (citrus fruits, tomato based sauces, salsa), greasy, fatty foods.  Please avoid caffeine and alcohol.     Please continue your omeprazole as prescribed. I recommend you stop taking ibuprofen. This may be worsening your symptoms.  Your cardiac tests today were normal. Please follow-up with your primary care physician and gastroenterologist.

## 2016-06-16 ENCOUNTER — Other Ambulatory Visit (HOSPITAL_COMMUNITY): Payer: Self-pay | Admitting: Gastroenterology

## 2016-06-16 DIAGNOSIS — R1013 Epigastric pain: Secondary | ICD-10-CM

## 2016-06-24 ENCOUNTER — Ambulatory Visit (HOSPITAL_COMMUNITY)
Admission: RE | Admit: 2016-06-24 | Discharge: 2016-06-24 | Disposition: A | Discharge status: Home / Self Care | Payer: Medicaid Other | Source: Ambulatory Visit | Attending: Gastroenterology | Admitting: Gastroenterology

## 2016-06-24 DIAGNOSIS — R1013 Epigastric pain: Secondary | ICD-10-CM | POA: Diagnosis not present

## 2016-06-24 DIAGNOSIS — R112 Nausea with vomiting, unspecified: Secondary | ICD-10-CM | POA: Insufficient documentation

## 2016-06-24 MED ORDER — TECHNETIUM TC 99M MEBROFENIN IV KIT
5.0000 | PACK | Freq: Once | INTRAVENOUS | Status: AC | PRN
  Administered 2016-06-24: 5 via INTRAVENOUS

## 2016-07-01 LAB — GLUCOSE, POCT (MANUAL RESULT ENTRY): POC Glucose: 210 mg/dl — AB (ref 70–99)

## 2016-07-13 ENCOUNTER — Telehealth: Payer: Self-pay | Admitting: *Deleted

## 2016-07-13 NOTE — Telephone Encounter (Signed)
Pt called to office to get results from last visit.  Pt made aware of results and has no further questions.

## 2016-07-16 ENCOUNTER — Ambulatory Visit (INDEPENDENT_AMBULATORY_CARE_PROVIDER_SITE_OTHER): Payer: Medicaid Other | Admitting: Physician Assistant

## 2016-07-30 ENCOUNTER — Ambulatory Visit (INDEPENDENT_AMBULATORY_CARE_PROVIDER_SITE_OTHER): Payer: Medicaid Other | Admitting: Physician Assistant

## 2016-08-11 ENCOUNTER — Ambulatory Visit: Payer: Medicaid Other | Admitting: Obstetrics

## 2016-08-26 ENCOUNTER — Ambulatory Visit: Payer: Medicaid Other | Admitting: Obstetrics

## 2016-09-08 ENCOUNTER — Ambulatory Visit (INDEPENDENT_AMBULATORY_CARE_PROVIDER_SITE_OTHER): Payer: Medicaid Other | Admitting: Physician Assistant

## 2016-09-14 ENCOUNTER — Ambulatory Visit: Payer: Medicaid Other | Admitting: Obstetrics

## 2016-09-17 ENCOUNTER — Ambulatory Visit: Payer: Self-pay | Admitting: General Surgery

## 2016-10-14 ENCOUNTER — Other Ambulatory Visit: Payer: Medicaid Other

## 2016-10-14 ENCOUNTER — Other Ambulatory Visit: Payer: Medicaid Other | Admitting: Obstetrics and Gynecology

## 2016-10-14 VITALS — BP 135/78 | HR 116 | Wt 326.0 lb

## 2016-10-14 DIAGNOSIS — R300 Dysuria: Secondary | ICD-10-CM

## 2016-10-14 MED ORDER — NITROFURANTOIN MONOHYD MACRO 100 MG PO CAPS: 100.0000 mg | ORAL_CAPSULE | Freq: Two times a day (BID) | ORAL | 0 refills | Status: DC

## 2016-10-14 NOTE — Progress Notes (Signed)
   SUBJECTIVE: Sarah Goodman is a 51 y.o. female who complains of urinary frequency, urgency and dysuria x 3 days, with left flank pain, no fever, no chills, or abnormal vaginal discharge or bleeding.   OBJECTIVE: Appears well, in no apparent distress.  Vital signs are normal. Urine dipstick shows positive for protein, + ketones, ++ Leu.    ASSESSMENT: Dysuria/UTI  PLAN: Treatment per orders.  Call or return to clinic prn if these symptoms worsen or fail to improve as anticipated.

## 2016-10-14 NOTE — Progress Notes (Signed)
Agree with nursing staff's documentation of this patient's clinic encounter.  Arthurine Oleary, MD    

## 2016-10-16 LAB — URINE CULTURE

## 2016-10-16 MED ORDER — DEXTROSE 5 % IV SOLN
3.0000 g | INTRAVENOUS | Status: AC
  Administered 2016-10-19: 13:00:00 via INTRAVENOUS
  Administered 2016-10-19: 3 g via INTRAVENOUS
  Filled 2016-10-16: qty 3000

## 2016-10-16 NOTE — Progress Notes (Signed)
Several unsuccessful attempts have been made to contact pt. A voice message as left on pt phone with pre-op instructions according to pre-op check list. Pt was made aware to take a half dose of Levemir insulin the night before surgery (Sunday night take 10 units), no diabetic medications the morning of procedure such as Metformin and Novolog insulin. Pt made aware to check BG every 2 hours prior to arrival to hospital on DOS. Pt made aware to treat a BG < 70 with 4 glucose tabs or glucose gel or 4 ounces of apple or cranberry juice, wait 15 minutes after intervention to recheck BG, if BG remains < 70, call Short Stay unit to speak with a nurse. Pt made aware to stop taking  Aspirin, vitamins, fish oil and herbal medications. Do not take any NSAIDs ie: Ibuprofen, Advil, Naproxen (Aleve), Motrin, BC and Goody Powder. or any medication containing Aspirin.

## 2016-10-19 ENCOUNTER — Ambulatory Visit (HOSPITAL_COMMUNITY)
Admission: RE | Admit: 2016-10-19 | Discharge: 2016-10-19 | Disposition: A | Discharge status: Home / Self Care | Payer: Medicaid Other | Source: Ambulatory Visit | Attending: General Surgery | Admitting: General Surgery

## 2016-10-19 ENCOUNTER — Ambulatory Visit (HOSPITAL_COMMUNITY): Payer: Medicaid Other | Admitting: Anesthesiology

## 2016-10-19 ENCOUNTER — Encounter (HOSPITAL_COMMUNITY)
Admission: RE | Disposition: A | Discharge status: Home / Self Care | Payer: Self-pay | Source: Ambulatory Visit | Attending: General Surgery

## 2016-10-19 ENCOUNTER — Encounter (HOSPITAL_COMMUNITY): Payer: Self-pay | Admitting: *Deleted

## 2016-10-19 DIAGNOSIS — K811 Chronic cholecystitis: Secondary | ICD-10-CM | POA: Diagnosis not present

## 2016-10-19 DIAGNOSIS — Z794 Long term (current) use of insulin: Secondary | ICD-10-CM | POA: Diagnosis not present

## 2016-10-19 DIAGNOSIS — Z79899 Other long term (current) drug therapy: Secondary | ICD-10-CM | POA: Insufficient documentation

## 2016-10-19 DIAGNOSIS — K219 Gastro-esophageal reflux disease without esophagitis: Secondary | ICD-10-CM | POA: Diagnosis not present

## 2016-10-19 DIAGNOSIS — Z6841 Body Mass Index (BMI) 40.0 and over, adult: Secondary | ICD-10-CM | POA: Insufficient documentation

## 2016-10-19 DIAGNOSIS — G43909 Migraine, unspecified, not intractable, without status migrainosus: Secondary | ICD-10-CM | POA: Insufficient documentation

## 2016-10-19 DIAGNOSIS — I10 Essential (primary) hypertension: Secondary | ICD-10-CM | POA: Diagnosis not present

## 2016-10-19 DIAGNOSIS — K828 Other specified diseases of gallbladder: Secondary | ICD-10-CM | POA: Diagnosis present

## 2016-10-19 DIAGNOSIS — E119 Type 2 diabetes mellitus without complications: Secondary | ICD-10-CM | POA: Diagnosis not present

## 2016-10-19 HISTORY — PX: CHOLECYSTECTOMY: SHX55

## 2016-10-19 LAB — CBC
HCT: 38.3 % (ref 36.0–46.0)
HCT: 36.3 % (ref 36.0–46.0)
Hemoglobin: 13.2 g/dL (ref 12.0–15.0)
Hemoglobin: 12.5 g/dL (ref 12.0–15.0)
MCH: 29.7 pg (ref 26.0–34.0)
MCH: 30 pg (ref 26.0–34.0)
MCHC: 34.5 g/dL (ref 30.0–36.0)
MCHC: 34.4 g/dL (ref 30.0–36.0)
MCV: 86.1 fL (ref 78.0–100.0)
MCV: 87.1 fL (ref 78.0–100.0)
Platelets: 184 10*3/uL (ref 150–400)
Platelets: 373 10*3/uL (ref 150–400)
RBC: 4.45 MIL/uL (ref 3.87–5.11)
RBC: 4.17 MIL/uL (ref 3.87–5.11)
RDW: 12.6 % (ref 11.5–15.5)
RDW: 12.5 % (ref 11.5–15.5)
WBC: 8 10*3/uL (ref 4.0–10.5)
WBC: 8 10*3/uL (ref 4.0–10.5)

## 2016-10-19 LAB — BASIC METABOLIC PANEL
Anion gap: 8 (ref 5–15)
BUN: 7 mg/dL (ref 6–20)
CO2: 25 mmol/L (ref 22–32)
Calcium: 9.2 mg/dL (ref 8.9–10.3)
Chloride: 98 mmol/L — ABNORMAL LOW (ref 101–111)
Creatinine, Ser: 0.65 mg/dL (ref 0.44–1.00)
GFR calc Af Amer: >60 mL/min (ref >60)
GFR calc non Af Amer: >60 mL/min (ref >60)
Glucose, Bld: 264 mg/dL — ABNORMAL HIGH (ref 65–99)
Potassium: 3.7 mmol/L (ref 3.5–5.1)
Sodium: 131 mmol/L — ABNORMAL LOW (ref 135–145)

## 2016-10-19 LAB — GLUCOSE, CAPILLARY
Glucose-Capillary: 252 mg/dL — ABNORMAL HIGH (ref 65–99)
Glucose-Capillary: 280 mg/dL — ABNORMAL HIGH (ref 65–99)
Glucose-Capillary: 205 mg/dL — ABNORMAL HIGH (ref 65–99)

## 2016-10-19 SURGERY — LAPAROSCOPIC CHOLECYSTECTOMY
Anesthesia: General | Site: Abdomen

## 2016-10-19 MED ORDER — MIDAZOLAM HCL 2 MG/2ML IJ SOLN
INTRAMUSCULAR | Status: AC
  Filled 2016-10-19: qty 2

## 2016-10-19 MED ORDER — FENTANYL CITRATE (PF) 100 MCG/2ML IJ SOLN
25.0000 ug | INTRAMUSCULAR | Status: DC | PRN
  Administered 2016-10-19: 50 ug via INTRAVENOUS

## 2016-10-19 MED ORDER — IOPAMIDOL (ISOVUE-300) INJECTION 61%
INTRAVENOUS | Status: AC
  Filled 2016-10-19: qty 50

## 2016-10-19 MED ORDER — ONDANSETRON HCL 4 MG/2ML IJ SOLN
INTRAMUSCULAR | Status: AC
  Filled 2016-10-19: qty 2

## 2016-10-19 MED ORDER — PROPRANOLOL HCL ER 60 MG PO CP24
60.0000 mg | ORAL_CAPSULE | ORAL | Status: AC
  Administered 2016-10-19: 60 mg via ORAL
  Filled 2016-10-19: qty 1

## 2016-10-19 MED ORDER — MIDAZOLAM HCL 5 MG/5ML IJ SOLN
INTRAMUSCULAR | Status: DC | PRN
  Administered 2016-10-19: 2 mg via INTRAVENOUS

## 2016-10-19 MED ORDER — LIDOCAINE 2% (20 MG/ML) 5 ML SYRINGE
INTRAMUSCULAR | Status: DC | PRN
  Administered 2016-10-19: 60 mg via INTRAVENOUS

## 2016-10-19 MED ORDER — PROPOFOL 10 MG/ML IV BOLUS
INTRAVENOUS | Status: AC
  Filled 2016-10-19: qty 20

## 2016-10-19 MED ORDER — CHLORHEXIDINE GLUCONATE CLOTH 2 % EX PADS: 6.0000 | MEDICATED_PAD | Freq: Once | CUTANEOUS | Status: DC

## 2016-10-19 MED ORDER — FENTANYL CITRATE (PF) 250 MCG/5ML IJ SOLN
INTRAMUSCULAR | Status: AC
  Filled 2016-10-19: qty 5

## 2016-10-19 MED ORDER — SODIUM CHLORIDE 0.9 % IR SOLN
Status: DC | PRN
  Administered 2016-10-19: 1000 mL

## 2016-10-19 MED ORDER — GLYCOPYRROLATE 0.2 MG/ML IJ SOLN
INTRAMUSCULAR | Status: DC | PRN
  Administered 2016-10-19: 0.6 mg via INTRAVENOUS

## 2016-10-19 MED ORDER — ROCURONIUM BROMIDE 10 MG/ML (PF) SYRINGE
PREFILLED_SYRINGE | INTRAVENOUS | Status: DC | PRN
  Administered 2016-10-19: 60 mg via INTRAVENOUS
  Administered 2016-10-19: 10 mg via INTRAVENOUS

## 2016-10-19 MED ORDER — SODIUM CHLORIDE 0.9 % IV SOLN
INTRAVENOUS | Status: DC | PRN
  Administered 2016-10-19: 1 mL

## 2016-10-19 MED ORDER — FENTANYL CITRATE (PF) 100 MCG/2ML IJ SOLN
INTRAMUSCULAR | Status: AC
  Filled 2016-10-19: qty 2

## 2016-10-19 MED ORDER — SUGAMMADEX SODIUM 200 MG/2ML IV SOLN
INTRAVENOUS | Status: AC
  Filled 2016-10-19: qty 2

## 2016-10-19 MED ORDER — DEXAMETHASONE SODIUM PHOSPHATE 10 MG/ML IJ SOLN
INTRAMUSCULAR | Status: DC | PRN
  Administered 2016-10-19: 5 mg via INTRAVENOUS

## 2016-10-19 MED ORDER — ROCURONIUM BROMIDE 10 MG/ML (PF) SYRINGE
PREFILLED_SYRINGE | INTRAVENOUS | Status: AC
  Filled 2016-10-19: qty 5

## 2016-10-19 MED ORDER — LIDOCAINE 2% (20 MG/ML) 5 ML SYRINGE
INTRAMUSCULAR | Status: AC
  Filled 2016-10-19: qty 5

## 2016-10-19 MED ORDER — BUPIVACAINE-EPINEPHRINE 0.25% -1:200000 IJ SOLN
INTRAMUSCULAR | Status: DC | PRN
  Administered 2016-10-19: 27 mL

## 2016-10-19 MED ORDER — LACTATED RINGERS IV SOLN
INTRAVENOUS | Status: DC
  Administered 2016-10-19 (×3): via INTRAVENOUS

## 2016-10-19 MED ORDER — ONDANSETRON HCL 4 MG/2ML IJ SOLN
4.0000 mg | Freq: Once | INTRAMUSCULAR | Status: AC | PRN
  Administered 2016-10-19: 4 mg via INTRAVENOUS

## 2016-10-19 MED ORDER — FENTANYL CITRATE (PF) 100 MCG/2ML IJ SOLN
INTRAMUSCULAR | Status: DC | PRN
  Administered 2016-10-19: 50 ug via INTRAVENOUS
  Administered 2016-10-19 (×2): 100 ug via INTRAVENOUS

## 2016-10-19 MED ORDER — ONDANSETRON HCL 4 MG/2ML IJ SOLN
INTRAMUSCULAR | Status: DC | PRN
  Administered 2016-10-19: 4 mg via INTRAVENOUS

## 2016-10-19 MED ORDER — MEPERIDINE HCL 25 MG/ML IJ SOLN: 6.2500 mg | INTRAMUSCULAR | Status: DC | PRN

## 2016-10-19 MED ORDER — BUPIVACAINE-EPINEPHRINE (PF) 0.25% -1:200000 IJ SOLN
INTRAMUSCULAR | Status: AC
  Filled 2016-10-19: qty 30

## 2016-10-19 MED ORDER — NEOSTIGMINE METHYLSULFATE 10 MG/10ML IV SOLN
INTRAVENOUS | Status: DC | PRN
  Administered 2016-10-19: 4 mg via INTRAVENOUS

## 2016-10-19 MED ORDER — OXYCODONE HCL 5 MG PO TABS: 5.0000 mg | ORAL_TABLET | Freq: Four times a day (QID) | ORAL | 0 refills | Status: DC | PRN

## 2016-10-19 MED ORDER — NEOSTIGMINE METHYLSULFATE 5 MG/5ML IV SOSY
PREFILLED_SYRINGE | INTRAVENOUS | Status: AC
  Filled 2016-10-19: qty 5

## 2016-10-19 MED ORDER — PROPOFOL 10 MG/ML IV BOLUS
INTRAVENOUS | Status: DC | PRN
  Administered 2016-10-19: 200 mg via INTRAVENOUS

## 2016-10-19 MED ORDER — 0.9 % SODIUM CHLORIDE (POUR BTL) OPTIME
TOPICAL | Status: DC | PRN
  Administered 2016-10-19: 1000 mL

## 2016-10-19 MED ORDER — DEXAMETHASONE SODIUM PHOSPHATE 10 MG/ML IJ SOLN
INTRAMUSCULAR | Status: AC
  Filled 2016-10-19: qty 1

## 2016-10-19 SURGICAL SUPPLY — 45 items
ADH SKN CLS APL DERMABOND .7 (GAUZE/BANDAGES/DRESSINGS) ×2
APPLIER CLIP 5 13 M/L LIGAMAX5 (MISCELLANEOUS) ×3
APR CLP MED LRG 5 ANG JAW (MISCELLANEOUS) ×2
BAG SPEC RTRVL 10 TROC 200 (ENDOMECHANICALS) ×2
BLADE CLIPPER SURG (BLADE) IMPLANT
CANISTER SUCT 3000ML PPV (MISCELLANEOUS) ×3 IMPLANT
CHLORAPREP W/TINT 26ML (MISCELLANEOUS) ×3 IMPLANT
CLIP APPLIE 5 13 M/L LIGAMAX5 (MISCELLANEOUS) ×2 IMPLANT
COVER MAYO STAND STRL (DRAPES) ×3 IMPLANT
COVER SURGICAL LIGHT HANDLE (MISCELLANEOUS) ×3 IMPLANT
DERMABOND ADVANCED (GAUZE/BANDAGES/DRESSINGS) ×1
DERMABOND ADVANCED .7 DNX12 (GAUZE/BANDAGES/DRESSINGS) ×2 IMPLANT
DRAPE C-ARM 42X72 X-RAY (DRAPES) ×3 IMPLANT
ELECT REM PT RETURN 9FT ADLT (ELECTROSURGICAL) ×3
ELECTRODE REM PT RTRN 9FT ADLT (ELECTROSURGICAL) ×2 IMPLANT
FILTER SMOKE EVAC LAPAROSHD (FILTER) IMPLANT
GLOVE BIO SURGEON STRL SZ8 (GLOVE) ×3 IMPLANT
GLOVE BIOGEL PI IND STRL 8 (GLOVE) ×2 IMPLANT
GLOVE BIOGEL PI INDICATOR 8 (GLOVE) ×1
GOWN STRL REUS W/ TWL LRG LVL3 (GOWN DISPOSABLE) ×4 IMPLANT
GOWN STRL REUS W/ TWL XL LVL3 (GOWN DISPOSABLE) ×2 IMPLANT
GOWN STRL REUS W/TWL LRG LVL3 (GOWN DISPOSABLE) ×6
GOWN STRL REUS W/TWL XL LVL3 (GOWN DISPOSABLE) ×3
KIT BASIN OR (CUSTOM PROCEDURE TRAY) ×3 IMPLANT
KIT ROOM TURNOVER OR (KITS) ×3 IMPLANT
L-HOOK LAP DISP 36CM (ELECTROSURGICAL) ×3
LHOOK LAP DISP 36CM (ELECTROSURGICAL) ×2 IMPLANT
NEEDLE 22X1 1/2 (OR ONLY) (NEEDLE) ×3 IMPLANT
NS IRRIG 1000ML POUR BTL (IV SOLUTION) ×3 IMPLANT
PAD ARMBOARD 7.5X6 YLW CONV (MISCELLANEOUS) ×3 IMPLANT
PENCIL BUTTON HOLSTER BLD 10FT (ELECTRODE) ×3 IMPLANT
POUCH RETRIEVAL ECOSAC 10 (ENDOMECHANICALS) ×2 IMPLANT
POUCH RETRIEVAL ECOSAC 10MM (ENDOMECHANICALS) ×1
SCISSORS LAP 5X35 DISP (ENDOMECHANICALS) ×3 IMPLANT
SET CHOLANGIOGRAPH 5 50 .035 (SET/KITS/TRAYS/PACK) ×3 IMPLANT
SET IRRIG TUBING LAPAROSCOPIC (IRRIGATION / IRRIGATOR) ×3 IMPLANT
SLEEVE ENDOPATH XCEL 5M (ENDOMECHANICALS) ×6 IMPLANT
SPECIMEN JAR SMALL (MISCELLANEOUS) ×3 IMPLANT
SUT VIC AB 4-0 PS2 27 (SUTURE) ×3 IMPLANT
TOWEL OR 17X24 6PK STRL BLUE (TOWEL DISPOSABLE) ×3 IMPLANT
TOWEL OR 17X26 10 PK STRL BLUE (TOWEL DISPOSABLE) ×3 IMPLANT
TRAY LAPAROSCOPIC MC (CUSTOM PROCEDURE TRAY) ×3 IMPLANT
TROCAR XCEL BLUNT TIP 100MML (ENDOMECHANICALS) ×3 IMPLANT
TROCAR XCEL NON-BLD 5MMX100MML (ENDOMECHANICALS) ×3 IMPLANT
TUBING INSUFFLATION (TUBING) ×3 IMPLANT

## 2016-10-19 NOTE — Op Note (Signed)
10/19/2016  2:31 PM  PATIENT:  Sarah Goodman  51 y.o. female  PRE-OPERATIVE DIAGNOSIS:  BILIARY DYSKINESIA  POST-OPERATIVE DIAGNOSIS:  BILIARY DYSKINESIA  PROCEDURE:  Procedure(s): LAPAROSCOPIC CHOLECYSTECTOMY  SURGEON:  Georganna Skeans, MD  ASSISTANTS: Fanny Skates, MD  ANESTHESIA:   local and general  EBL:  Total I/O In: 1000 [I.V.:1000] Out: -   BLOOD ADMINISTERED:none  DRAINS: none   SPECIMEN:  Excision  DISPOSITION OF SPECIMEN:  PATHOLOGY  COUNTS:  YES  DICTATION: Viviann Spare Dictation [name] presents for cholecystectomy. She was identified in the preop holding area. Informed consent was obtained. She received intravenous and a bionics. She was brought to the operative room and general endotracheal anesthesia was administered by the anesthesia staff. Her abdomen was prepped and draped in a sterile fashion. We did a time out procedure.The supraumbilical region was infiltrated with local. Supraumbilical incision was made. Subcutaneous tissues were dissected down revealing the anterior fascia. This was divided sharply along the midline. Peritoneal cavity was entered under direct vision without complication. A 0 Vicryl pursestring was placed around the fascial opening. Hassan trocar was inserted into the abdomen. The abdomen was insufflated with carbon dioxide in standard fashion. Under direct vision a 5 mm epigastric and 5 mm right abdominal ports were placed. Local was used at each port site. The dome the gallbladder was retracted superior medially. The infundibulum was retracted inferior laterally. Dissection began laterally and progressed medially. This easily identified the cystic duct and the cystic artery. Dissection continued on the cystic duct until we had a critical view between it and the infundibulum and the liver. 3 clips were then placed proximally on the cystic duct, one was placed distally and it was divided. 2 clips were placed proximally on the cystic artery,  one was placed distally and it was divided. The gallbladder was taken off the liver bed using Bovie cautery achieving excellent hemostasis along the way. The gallbladder was placed in a bag and removed from the abdomen. It was sent to pathology. Liver bed was cauterized getting excellent hemostasis. Area was irrigated. Liver bed was then dry. Clips were in good position. Irrigation fluid was evacuated. Ports were removed under direct vision. Pneumoperitoneum was released. The supraumbilical fascia was closed by tying the pursestring. All 4 wounds were irrigated and the skin of each was closed with running 4 Vicryl subcuticular followed by Dermabond. All counts were correct. She tolerated the procedure without apparent complications and was taken recovery in stable condition.  PATIENT DISPOSITION:  PACU - hemodynamically stable.   Delay start of Pharmacological VTE agent (>24hrs) due to surgical blood loss or risk of bleeding:  no  Georganna Skeans, MD, MPH, FACS Pager: [phone]  9/10/20182:31 PM

## 2016-10-19 NOTE — Anesthesia Preprocedure Evaluation (Addendum)
Anesthesia Evaluation  Patient identified by MRN, date of birth, ID band Patient awake    Reviewed: Allergy & Precautions, H&P , Patient's Chart, lab work & pertinent test results, reviewed documented beta blocker date and time   History of Anesthesia Complications Negative for: history of anesthetic complications  Airway Mallampati: I  TM Distance: >3 FB Neck ROM: Full    Dental no notable dental hx.    Pulmonary neg pulmonary ROS, asthma ,    Pulmonary exam normal breath sounds clear to auscultation       Cardiovascular Exercise Tolerance: Good hypertension, negative cardio ROS   Rhythm:regular Rate:Normal     Neuro/Psych  Headaches, negative neurological ROS  negative psych ROS   GI/Hepatic negative GI ROS, Neg liver ROS, GERD  Controlled,  Endo/Other  negative endocrine ROSdiabetes, Type 2, Insulin Dependent, Oral Hypoglycemic AgentsMorbid obesity  Renal/GU negative Renal ROS     Musculoskeletal   Abdominal   Peds  Hematology negative hematology ROS (+)   Anesthesia Other Findings Diabetes mellitus     Headache   migraines    GERD (gastroesophageal reflux disease)     Hypertension   Patient denies, no BP med , d/c'd by MD    Reproductive/Obstetrics negative OB ROS                           Anesthesia Physical  Anesthesia Plan  ASA: III  Anesthesia Plan: General LMA   Post-op Pain Management:    Induction: Intravenous  PONV Risk Score and Plan: 3 and Ondansetron, Dexamethasone, Midazolam and Treatment may vary due to age or medical condition  Airway Management Planned: Oral ETT  Additional Equipment:   Intra-op Plan:   Post-operative Plan: Extubation in OR  Informed Consent: I have reviewed the patients History and Physical, chart, labs and discussed the procedure including the risks, benefits and alternatives for the proposed anesthesia with the patient or authorized  representative who has indicated his/her understanding and acceptance.   Dental Advisory Given  Plan Discussed with: CRNA, Surgeon and Anesthesiologist  Anesthesia Plan Comments: (  )        Anesthesia Quick Evaluation

## 2016-10-19 NOTE — Transfer of Care (Signed)
Immediate Anesthesia Transfer of Care Note  Patient: Sarah Goodman  Procedure(s) Performed: Procedure(s): LAPAROSCOPIC CHOLECYSTECTOMY (N/A)  Patient Location: PACU  Anesthesia Type:General  Level of Consciousness: awake, alert , oriented and patient cooperative  Airway & Oxygen Therapy: Patient Spontanous Breathing and Patient connected to nasal cannula oxygen  Post-op Assessment: Report given to RN and Post -op Vital signs reviewed and stable  Post vital signs: Reviewed and stable  Last Vitals:  Vitals:   10/19/16 1030  BP: (!) 158/97  Pulse: (!) 110  Resp: 19  Temp: 36.6 C  SpO2: 94%    Last Pain:  Vitals:   10/19/16 1030  TempSrc: Oral      Patients Stated Pain Goal: 2 (95/32/02 3343)  Complications: No apparent anesthesia complications

## 2016-10-19 NOTE — Interval H&P Note (Signed)
History and Physical Interval Note:  10/19/2016 12:28 PM  Sarah Goodman  has presented today for surgery, with the diagnosis of biliary dyskinesia  The various methods of treatment have been discussed with the patient and family. After consideration of risks, benefits and other options for treatment, the patient has consented to  Procedure(s): LAPAROSCOPIC CHOLECYSTECTOMY WITH INTRAOPERATIVE CHOLANGIOGRAM (N/A) as a surgical intervention .  The patient's history has been reviewed, patient examined, no change in status, stable for surgery.  I have reviewed the patient's chart and labs.  Questions were answered to the patient's satisfaction.     Khamari Yousuf E

## 2016-10-19 NOTE — H&P (Signed)
Sarah Goodman 09/16/2016 10:34 AM Location: [facility] Patient #: [id number] DOB: 07-28-1965 Single / Language: Cleophus Molt / Race: Black or African American Female   History of Present Illness Lavone Neri E. Grandville Silos MD; 09/16/2016 11:03 AM) The patient is a 51 year old female who presents for evaluation of gallbladder disease. I was asked to see in consultation by Dr. Clarene Essex regarding biliary dyskinesia. She has been having epigastric and right upper quadrant pain associated with eating for several months. She also has nausea when she has these episodes. She has undergone a very thorough workup including upper endoscopy, an abdominal ultrasound. Both of these were unrevealing. She underwent HIDA scan and was found to have a decreased gallbladder ejection fraction of 31%. Fatty foods and large meals exacerbate her symptoms. She is eating more soups and low-fat diet which has helped only somewhat. When she is unable to eat, she has trouble with her blood sugar. She is an insulin-dependent diabetic.   Past Surgical History Illene Regulus, CMA; 09/16/2016 10:41 AM) Spinal Surgery - Neck   Diagnostic Studies History Lars Mage Spillers, CMA; 09/16/2016 10:41 AM) Mammogram  within last year Pap Smear  1-5 years ago  Allergies Illene Regulus, CMA; 09/16/2016 10:44 AM) No Known Drug Allergies 09/16/2016  Medication History (Alisha Spillers, CMA; 09/16/2016 10:48 AM) Accu-Chek Aviva Plus (In Vitro) Active. Carafate (1GM/10ML Suspension, Oral) Active. Fluconazole (150MG  Tablet, Oral) Active. Lisinopril (20MG  Tablet, Oral) Active. MetFORMIN HCl (1000MG  Tablet, Oral) Active. Nortriptyline HCl (75MG  Capsule, Oral) Active. Ondansetron (4MG  Tablet Disint, Oral) Active. Pantoprazole Sodium (40MG  Tablet DR, Oral) Active. ProAir HFA (108 (90 Base)MCG/ACT Aerosol Soln, Inhalation) Active. SUMAtriptan Succinate (25MG  Tablet, Oral) Active. Gabapentin (300MG  Capsule, Oral)  Active. Medications Reconciled  Social History Illene Regulus, CMA; 09/16/2016 10:41 AM) No alcohol use  No drug use   Family History Illene Regulus, CMA; 09/16/2016 10:41 AM) Diabetes Mellitus  Father. Heart disease in female family member before age 59  Hypertension  Father. Migraine Headache  Father.  Pregnancy / Birth History Illene Regulus, CMA; 09/16/2016 10:41 AM) Age at menarche  25 years. Age of menopause  <45 Gravida  4 Irregular periods  Maternal age  31-20 Para  4  Other Problems Illene Regulus, CMA; 09/16/2016 10:41 AM) Bladder Problems  Diabetes Mellitus  Gastroesophageal Reflux Disease  Migraine Headache     Review of Systems (Alisha Spillers CMA; 09/16/2016 10:41 AM) General Present- Appetite Loss. Not Present- Chills, Fatigue, Fever, Night Sweats, Weight Gain and Weight Loss. Skin Present- New Lesions and Non-Healing Wounds. Not Present- Change in Wart/Mole, Dryness, Hives, Jaundice, Rash and Ulcer. HEENT Present- Wears glasses/contact lenses. Not Present- Earache, Hearing Loss, Hoarseness, Nose Bleed, Oral Ulcers, Ringing in the Ears, Seasonal Allergies, Sinus Pain, Sore Throat, Visual Disturbances and Yellow Eyes. Respiratory Not Present- Bloody sputum, Chronic Cough, Difficulty Breathing, Snoring and Wheezing. Breast Not Present- Breast Mass, Breast Pain, Nipple Discharge and Skin Changes. Cardiovascular Not Present- Chest Pain, Difficulty Breathing Lying Down, Leg Cramps, Palpitations, Rapid Heart Rate, Shortness of Breath and Swelling of Extremities. Gastrointestinal Present- Abdominal Pain, Nausea and Vomiting. Not Present- Bloating, Bloody Stool, Change in Bowel Habits, Chronic diarrhea, Constipation, Difficulty Swallowing, Excessive gas, Gets full quickly at meals, Hemorrhoids, Indigestion and Rectal Pain. Female Genitourinary Not Present- Frequency, Nocturia, Painful Urination, Pelvic Pain and Urgency. Musculoskeletal Present- Back Pain.  Not Present- Joint Pain, Joint Stiffness, Muscle Pain, Muscle Weakness and Swelling of Extremities. Neurological Not Present- Decreased Memory, Fainting, Headaches, Numbness, Seizures, Tingling, Tremor, Trouble walking and Weakness. Psychiatric Not  Present- Anxiety, Bipolar, Change in Sleep Pattern, Depression, Fearful and Frequent crying. Endocrine Not Present- Cold Intolerance, Excessive Hunger, Hair Changes, Heat Intolerance, Hot flashes and New Diabetes. Hematology Not Present- Blood Thinners, Easy Bruising, Excessive bleeding, Gland problems, HIV and Persistent Infections.  Vitals (Alisha Spillers CMA; 09/16/2016 10:44 AM) 09/16/2016 10:43 AM Weight: 322.4 lb Height: 65in Body Surface Area: 2.42 m Body Mass Index: 53.65 kg/m  Pulse: 86 (Regular)  BP: 128/86 (Sitting, Left Arm, Standard)       Physical Exam (Natasja Niday E. Grandville Silos MD; 09/16/2016 11:04 AM) General Mental Status-Alert. General Appearance-Consistent with stated age. Hydration-Well hydrated. Voice-Normal.  Head and Neck Head-normocephalic, atraumatic with no lesions or palpable masses. Trachea-midline. Thyroid Gland Characteristics - normal size and consistency.  Eye Eyeball - Bilateral-Extraocular movements intact. Sclera/Conjunctiva - Bilateral-No scleral icterus.  Chest and Lung Exam Chest and lung exam reveals -quiet, even and easy respiratory effort with no use of accessory muscles and on auscultation, normal breath sounds, no adventitious sounds and normal vocal resonance. Inspection Chest Wall - Normal. Back - normal.  Cardiovascular Cardiovascular examination reveals -normal heart sounds, regular rate and rhythm with no murmurs and normal pedal pulses bilaterally.  Abdomen Inspection Inspection of the abdomen reveals - No Hernias. Note: She has a small area of excoriation on her left lower pannus which is shallow with clean granulation tissue and no evidence of  infection. Palpation/Percussion Palpation and Percussion of the abdomen reveal - Soft, Non Tender, No Rebound tenderness, No Rigidity (guarding) and No hepatosplenomegaly. Auscultation Auscultation of the abdomen reveals - Bowel sounds normal.  Neurologic Neurologic evaluation reveals -alert and oriented x 3 with no impairment of recent or remote memory. Mental Status-Normal.  Musculoskeletal Global Assessment -Note: no gross deformities.  Normal Exam - Left-Upper Extremity Strength Normal and Lower Extremity Strength Normal. Normal Exam - Right-Upper Extremity Strength Normal and Lower Extremity Strength Normal.  Lymphatic Head & Neck  General Head & Neck Lymphatics: Bilateral - Description - Normal. Axillary  General Axillary Region: Bilateral - Description - Normal. Tenderness - Non Tender. Femoral & Inguinal  Generalized Femoral & Inguinal Lymphatics: Bilateral - Description - No Generalized lymphadenopathy.    Assessment & Plan Lavone Neri E. Grandville Silos MD; 09/16/2016 11:04 AM) BILIARY DYSKINESIA (K82.8) Impression: I have offered laparoscopic cholecystectomy with intraoperative cholangiogram. I discussed the procedure, risks, and benefits. I discussed the expected postoperative course and that we have a good chance of resolving her symptoms with surgery. She is agreeable.  9/10 exam same as above. No other additions.  Georganna Skeans, MD, MPH, FACS Trauma: [phone] General Surgery: [phone]

## 2016-10-19 NOTE — Progress Notes (Signed)
Notified main pharmacy of need for beta blocker and called once still waiting for beta blocker to come up from pharmacy

## 2016-10-20 ENCOUNTER — Encounter (HOSPITAL_COMMUNITY): Payer: Self-pay | Admitting: General Surgery

## 2016-10-20 NOTE — Anesthesia Postprocedure Evaluation (Signed)
Anesthesia Post Note  Patient: Sarah Goodman  Procedure(s) Performed: Procedure(s) (LRB): LAPAROSCOPIC CHOLECYSTECTOMY (N/A)     Patient location during evaluation: PACU Anesthesia Type: General Level of consciousness: awake and alert Pain management: pain level controlled Vital Signs Assessment: post-procedure vital signs reviewed and stable Respiratory status: spontaneous breathing, nonlabored ventilation, respiratory function stable and patient connected to nasal cannula oxygen Cardiovascular status: blood pressure returned to baseline and stable Postop Assessment: no signs of nausea or vomiting Anesthetic complications: no    Last Vitals:  Vitals:   10/19/16 1615 10/19/16 1625  BP: (!) 156/82 (!) 160/92  Pulse: 84 77  Resp: (!) 27   Temp: (!) 36.4 C   SpO2: 96% 96%    Last Pain:  Vitals:   10/19/16 1615  TempSrc:   PainSc: [place]

## 2016-12-23 ENCOUNTER — Ambulatory Visit: Payer: Medicaid Other | Admitting: Obstetrics

## 2017-03-23 ENCOUNTER — Ambulatory Visit: Payer: Medicaid Other | Admitting: Obstetrics

## 2017-04-01 ENCOUNTER — Ambulatory Visit: Payer: Medicaid Other | Admitting: Obstetrics

## 2017-04-29 ENCOUNTER — Ambulatory Visit: Payer: Medicaid Other | Admitting: Obstetrics

## 2017-04-29 ENCOUNTER — Other Ambulatory Visit (HOSPITAL_COMMUNITY)
Admission: RE | Admit: 2017-04-29 | Discharge: 2017-04-29 | Disposition: A | Discharge status: Home / Self Care | Payer: Medicaid Other | Source: Ambulatory Visit | Attending: Obstetrics | Admitting: Obstetrics

## 2017-04-29 VITALS — BP 131/92 | HR 122 | Ht 69.0 in | Wt 299.0 lb

## 2017-04-29 DIAGNOSIS — Z0001 Encounter for general adult medical examination with abnormal findings: Secondary | ICD-10-CM | POA: Diagnosis not present

## 2017-04-29 DIAGNOSIS — R309 Painful micturition, unspecified: Secondary | ICD-10-CM

## 2017-04-29 DIAGNOSIS — N3000 Acute cystitis without hematuria: Secondary | ICD-10-CM

## 2017-04-29 DIAGNOSIS — Z01419 Encounter for gynecological examination (general) (routine) without abnormal findings: Secondary | ICD-10-CM | POA: Insufficient documentation

## 2017-04-29 LAB — POCT URINALYSIS DIPSTICK
Bilirubin, UA: NEGATIVE
Blood, UA: NEGATIVE
Glucose, UA: POSITIVE
Spec Grav, UA: >=1.03 — AB (ref 1.010–1.025)
Urobilinogen, UA: 0.2 E.U./dL
pH, UA: 5 (ref 5.0–8.0)

## 2017-04-29 NOTE — Progress Notes (Signed)
Patient states that she is having left, lower abdominal pain, and pain with urination. Pain has been on and off since Thanksgiving. Pt states that she is already seeing Dr. Vista Lawman for rectal bleeding. Last pap 04-27-17

## 2017-04-30 ENCOUNTER — Encounter: Payer: Self-pay | Admitting: Obstetrics

## 2017-04-30 LAB — HEPATITIS B SURFACE ANTIGEN: Hepatitis B Surface Ag: NEGATIVE

## 2017-04-30 LAB — CERVICOVAGINAL ANCILLARY ONLY
Bacterial vaginitis: NEGATIVE
Candida vaginitis: NEGATIVE
Chlamydia: NEGATIVE
Neisseria Gonorrhea: NEGATIVE
Trichomonas: NEGATIVE

## 2017-04-30 LAB — RPR: RPR Ser Ql: NONREACTIVE

## 2017-04-30 LAB — HIV ANTIBODY (ROUTINE TESTING W REFLEX): HIV Screen 4th Generation wRfx: NONREACTIVE

## 2017-04-30 LAB — HEPATITIS C ANTIBODY: Hep C Virus Ab: <0.1 s/co ratio (ref 0.0–0.9)

## 2017-04-30 MED ORDER — CEFIXIME 400 MG PO CAPS: 400.0000 mg | ORAL_CAPSULE | Freq: Every day | ORAL | 1 refills | Status: DC

## 2017-04-30 NOTE — Progress Notes (Signed)
Subjective:        Sarah Goodman is a 52 y.o. female here for a routine exam.  Current complaints: Lower abdominal pain with urination.  Rectal bleeding.  Personal health questionnaire:  Is patient [name], have a family history of breast and/or ovarian cancer: no Is there a family history of uterine cancer diagnosed at age < 18, gastrointestinal cancer, urinary tract cancer, family member who is a Field seismologist syndrome-associated carrier: no Is the patient overweight and hypertensive, family history of diabetes, personal history of gestational diabetes, preeclampsia or PCOS: no Is patient over 18, have PCOS,  family history of premature CHD under age 4, diabetes, smoke, have hypertension or peripheral artery disease:  no At any time, has a partner hit, kicked or otherwise hurt or frightened you?: no Over the past 2 weeks, have you felt down, depressed or hopeless?: no Over the past 2 weeks, have you felt little interest or pleasure in doing things?:no   Gynecologic History No LMP recorded. (Menstrual status: Perimenopausal). Contraception: none Last Pap: 2018. Results were: normal Last mammogram: 2016. Results were: normal  Obstetric History OB History  Gravida Para Term Preterm AB Living  4 4       4   SAB TAB Ectopic Multiple Live Births          4    # Outcome Date GA Lbr Len/2nd Weight Sex Delivery Anes PTL Lv  4 Para         LIV  3 Para         LIV  2 Para         LIV  1 Para         LIV    Past Medical History:  Diagnosis Date  . Chest pain    a. 05/2012 Cath: LM nl, LAD nl, LCX nl, RCA nl, EF 65%;  b. 02/2015 Echo: EF 60-65%, no rwma, Gr 1 DD, nl LA size; c. 02/2015 CTA chest: No PE.  [place] Endometriosis   . GERD (gastroesophageal reflux disease)   . Headache(784.0)    migraines  . Hypertension   . Memory difficulty   . Migraine   . Morbid obesity ([place])   . Positive D dimer 03/2015  . Type II diabetes mellitus ([place])     Past Surgical History:  Procedure  Laterality Date  . CHOLECYSTECTOMY N/A 10/19/2016   Procedure: LAPAROSCOPIC CHOLECYSTECTOMY;  Surgeon: Georganna Skeans, MD;  Location: [facility];  Service: General;  Laterality: N/A;  . COLPOSCOPY  12/18/2011   Procedure: COLPOSCOPY;  Surgeon: Lahoma Crocker, MD;  Location: [facility];  Service: Gynecology;  Laterality: N/A;  . HYSTEROSCOPY W/D&C  12/18/2011   Procedure: DILATATION AND CURETTAGE /HYSTEROSCOPY;  Surgeon: Lahoma Crocker, MD;  Location: [facility];  Service: Gynecology;  Laterality: N/A;  . LEFT HEART CATHETERIZATION WITH CORONARY ANGIOGRAM N/A 05/26/2012   Procedure: LEFT HEART CATHETERIZATION WITH CORONARY ANGIOGRAM;  Surgeon: Sinclair Grooms, MD;  Location: [facility] CATH LAB;  Service: Cardiovascular;  Laterality: N/A;     Current Outpatient Medications:  .  LEVEMIR FLEXTOUCH 100 UNIT/ML Pen, Inject 20 Units into the skin at bedtime. , Disp: , Rfl: 0 .  lisinopril (PRINIVIL,ZESTRIL) 20 MG tablet, Take 20 mg by mouth daily., Disp: , Rfl:  .  metFORMIN (GLUCOPHAGE) 850 MG tablet, Take 1 tablet (850 mg total) by mouth 2 (two) times daily with a meal., Disp: , Rfl:  .  NOVOLOG MIX 70/30 FLEXPEN (70-30) 100 UNIT/ML FlexPen, INJECT 24  UNITS SUBCUTANEOUSLY BID, Disp: , Rfl: 3 .  omeprazole (PRILOSEC) 20 MG capsule, Take 1 capsule (20 mg total) by mouth 2 (two) times daily before a meal., Disp: 60 capsule, Rfl: 11 .  albuterol (PROVENTIL HFA;VENTOLIN HFA) 108 (90 BASE) MCG/ACT inhaler, Inhale 2 puffs into the lungs 3 (three) times daily as needed for wheezing or shortness of breath. , Disp: , Rfl:  .  Cefixime (SUPRAX) 400 MG CAPS capsule, Take 1 capsule (400 mg total) by mouth daily., Disp: 7 capsule, Rfl: 1 .  clotrimazole (LOTRIMIN) 1 % cream, Apply 1 application topically 2 (two) times daily. (Patient not taking: Reported on 04/29/2017), Disp: 113 g, Rfl: 5 .  fluconazole (DIFLUCAN) 200 MG tablet, Take 1 tablet (200 mg total) by mouth every other day. (Patient not taking: Reported on 04/29/2017),  Disp: 3 tablet, Rfl: 5 .  nitrofurantoin, macrocrystal-monohydrate, (MACROBID) 100 MG capsule, Take 1 capsule (100 mg total) by mouth 2 (two) times daily. (Patient not taking: Reported on 10/19/2016), Disp: 14 capsule, Rfl: 0 .  nortriptyline (PAMELOR) 50 MG capsule, 2 tabs po qhs (Patient taking differently: Take 50 mg by mouth at bedtime. ), Disp: 60 capsule, Rfl: 11 .  ondansetron (ZOFRAN ODT) 4 MG disintegrating tablet, Take 1 tablet (4 mg total) by mouth every 8 (eight) hours as needed. (Patient not taking: Reported on 04/29/2017), Disp: 20 tablet, Rfl: 11 .  oxyCODONE (OXY IR/ROXICODONE) 5 MG immediate release tablet, Take 1-2 tablets (5-10 mg total) by mouth every 6 (six) hours as needed for moderate pain or severe pain. (Patient not taking: Reported on 04/29/2017), Disp: 30 tablet, Rfl: 0 .  propranolol (INDERAL) 60 MG tablet, Take 60 mg by mouth daily., Disp: , Rfl:  .  rizatriptan (MAXALT-MLT) 10 MG disintegrating tablet, Take 1 tablet (10 mg total) by mouth as needed for migraine. May repeat in 2 hours if needed (Patient not taking: Reported on 04/29/2017), Disp: 12 tablet, Rfl: 11 .  sucralfate (CARAFATE) 1 GM/10ML suspension, Take 10 mLs (1 g total) by mouth 4 (four) times daily -  with meals and at bedtime. (Patient not taking: Reported on 10/19/2016), Disp: 420 mL, Rfl: 0 No Known Allergies  Social History   Tobacco Use  . Smoking status: Never Smoker  . Smokeless tobacco: Never Used  Substance Use Topics  . Alcohol use: No    Alcohol/week: 0.0 oz    Family History  Problem Relation Age of Onset  . Diabetes Mother   . Hypertension Mother   . CAD Mother 52       Early onset CAD, with "blockage in artery and stent in heart"      Review of Systems  Constitutional: negative for fatigue and weight loss Respiratory: negative for cough and wheezing Cardiovascular: negative for chest pain, fatigue and palpitations Gastrointestinal: negative for abdominal pain and change in bowel  habits Musculoskeletal:negative for myalgias Neurological: negative for gait problems and tremors Behavioral/Psych: negative for abusive relationship, depression Endocrine: negative for temperature intolerance    Genitourinary:negative for abnormal menstrual periods, genital lesions, hot flashes, sexual problems and vaginal discharge Integument/breast: negative for breast lump, breast tenderness, nipple discharge and skin lesion(s)    Objective:       BP (!) 131/92   Pulse (!) 122   Ht 5\' 9"  (1.753 m)   Wt 299 lb (135.6 kg)   BMI 44.15 kg/m  General:   alert  Skin:   no rash or abnormalities  Lungs:   clear to auscultation bilaterally  Heart:   regular rate and rhythm, S1, S2 normal, no murmur, click, rub or gallop  Breasts:   normal without suspicious masses, skin or nipple changes or axillary nodes  Abdomen:  normal findings: no organomegaly, soft, non-tender and no hernia  Pelvis:  External genitalia: normal general appearance Urinary system: urethral meatus normal and bladder without fullness, nontender Vaginal: normal without tenderness, induration or masses Cervix: normal appearance Adnexa: normal bimanual exam Uterus: anteverted and non-tender, normal size   Lab Review Urine pregnancy test Labs reviewed yes Radiologic studies reviewed yes  50% of 20 min visit spent on counseling and coordination of care.   Assessment:     1. Encounter for routine gynecological examination with Papanicolaou smear of cervix Rx: - HIV antibody (with reflex) - RPR - Hepatitis B Surface AntiGEN - Hepatitis C Antibody - Cervicovaginal ancillary only - Cytology - PAP  2. Urinary pain Rx: - POCT Urinalysis Dipstick  3. Acute cystitis without hematuria Rx: - Urine Culture - Cefixime (SUPRAX) 400 MG CAPS capsule; Take 1 capsule (400 mg total) by mouth daily.  Dispense: 7 capsule; Refill: 1   Plan:    Education reviewed: calcium supplements, depression evaluation, low fat, low  cholesterol diet, safe sex/STD prevention, self breast exams and weight bearing exercise. Contraception: none. Follow up in: 1 year.   Meds ordered this encounter  Medications  . Cefixime (SUPRAX) 400 MG CAPS capsule    Sig: Take 1 capsule (400 mg total) by mouth daily.    Dispense:  7 capsule    Refill:  1   Orders Placed This Encounter  Procedures  . Urine Culture  . HIV antibody (with reflex)  . RPR  . Hepatitis B Surface AntiGEN  . Hepatitis C Antibody  . POCT Urinalysis Dipstick    Shelly Bombard MD 04-29-2017

## 2017-05-01 LAB — URINE CULTURE

## 2017-05-04 ENCOUNTER — Telehealth: Payer: Self-pay

## 2017-05-04 LAB — CYTOLOGY - PAP
Diagnosis: NEGATIVE
HPV: NOT DETECTED

## 2017-05-04 NOTE — Telephone Encounter (Signed)
Patient called wanting her lab results

## 2017-06-01 ENCOUNTER — Ambulatory Visit: Payer: Medicaid Other | Admitting: Obstetrics

## 2017-06-01 ENCOUNTER — Encounter: Payer: Self-pay | Admitting: Obstetrics

## 2017-06-01 VITALS — BP 140/95 | HR 96 | Temp 98.0°F | Wt 299.6 lb

## 2017-06-01 DIAGNOSIS — M549 Dorsalgia, unspecified: Secondary | ICD-10-CM

## 2017-06-01 DIAGNOSIS — R102 Pelvic and perineal pain: Secondary | ICD-10-CM

## 2017-06-01 DIAGNOSIS — Z6841 Body Mass Index (BMI) 40.0 and over, adult: Secondary | ICD-10-CM

## 2017-06-01 LAB — POCT URINALYSIS DIPSTICK
Bilirubin, UA: NEGATIVE
Blood, UA: NEGATIVE
Glucose, UA: NEGATIVE
Ketones, UA: NEGATIVE
Nitrite, UA: NEGATIVE
Spec Grav, UA: 1.01 (ref 1.010–1.025)
Urobilinogen, UA: 0.2 E.U./dL
pH, UA: 6.5 (ref 5.0–8.0)

## 2017-06-01 MED ORDER — METHOCARBAMOL 500 MG PO TABS: 500.0000 mg | ORAL_TABLET | Freq: Three times a day (TID) | ORAL | 2 refills | Status: DC | PRN

## 2017-06-01 MED ORDER — CYCLOBENZAPRINE HCL 10 MG PO TABS: 10.0000 mg | ORAL_TABLET | Freq: Three times a day (TID) | ORAL | 1 refills | Status: DC | PRN

## 2017-06-01 NOTE — Progress Notes (Signed)
RGYN patient presents for problem visit today . Left sided pelvic pan that has been going on x 2 weeks. Pt denies any bleeding, no constipation.  Pt takes OTC medication to help yet no relief  (tylenol).  No cycles. Pt is not sexually active.

## 2017-06-01 NOTE — Progress Notes (Signed)
Patient ID: Sarah Goodman, female   DOB: 08-25-1965, 52 y.o.   MRN: [id number]  Chief Complaint  Patient presents with  . Pelvic Pain    HPI Sarah Goodman is a 52 y.o. female.  Left-sided lower back pain that radiates through pelvis down left leg. HPI  Past Medical History:  Diagnosis Date  . Chest pain    a. 05/2012 Cath: LM nl, LAD nl, LCX nl, RCA nl, EF 65%;  b. 02/2015 Echo: EF 60-65%, no rwma, Gr 1 DD, nl LA size; c. 02/2015 CTA chest: No PE.  Marland Kitchen Endometriosis   . GERD (gastroesophageal reflux disease)   . Headache(784.0)    migraines  . Hypertension   . Memory difficulty   . Migraine   . Morbid obesity ([place])   . Positive D dimer 03/2015  . Type II diabetes mellitus ([place])     Past Surgical History:  Procedure Laterality Date  . CHOLECYSTECTOMY N/A 10/19/2016   Procedure: LAPAROSCOPIC CHOLECYSTECTOMY;  Surgeon: Georganna Skeans, MD;  Location: [facility];  Service: General;  Laterality: N/A;  . COLPOSCOPY  12/18/2011   Procedure: COLPOSCOPY;  Surgeon: Lahoma Crocker, MD;  Location: [facility];  Service: Gynecology;  Laterality: N/A;  . HYSTEROSCOPY W/D&C  12/18/2011   Procedure: DILATATION AND CURETTAGE /HYSTEROSCOPY;  Surgeon: Lahoma Crocker, MD;  Location: [facility];  Service: Gynecology;  Laterality: N/A;  . LEFT HEART CATHETERIZATION WITH CORONARY ANGIOGRAM N/A 05/26/2012   Procedure: LEFT HEART CATHETERIZATION WITH CORONARY ANGIOGRAM;  Surgeon: Sinclair Grooms, MD;  Location: [facility] CATH LAB;  Service: Cardiovascular;  Laterality: N/A;    Family History  Problem Relation Age of Onset  . Diabetes Mother   . Hypertension Mother   . CAD Mother 41       Early onset CAD, with "blockage in artery and stent in heart"    Social History Social History   Tobacco Use  . Smoking status: Never Smoker  . Smokeless tobacco: Never Used  Substance Use Topics  . Alcohol use: No    Alcohol/week: 0.0 oz  . Drug use: No    No Known Allergies  Current Outpatient Medications   Medication Sig Dispense Refill  . albuterol (PROVENTIL HFA;VENTOLIN HFA) 108 (90 BASE) MCG/ACT inhaler Inhale 2 puffs into the lungs 3 (three) times daily as needed for wheezing or shortness of breath.     Marland Kitchen LEVEMIR FLEXTOUCH 100 UNIT/ML Pen Inject 20 Units into the skin at bedtime.   0  . lisinopril (PRINIVIL,ZESTRIL) 20 MG tablet Take 20 mg by mouth daily.    . metFORMIN (GLUCOPHAGE) 850 MG tablet Take 1 tablet (850 mg total) by mouth 2 (two) times daily with a meal.    . Cefixime (SUPRAX) 400 MG CAPS capsule Take 1 capsule (400 mg total) by mouth daily. (Patient not taking: Reported on 06/01/2017) 7 capsule 1  . clotrimazole (LOTRIMIN) 1 % cream Apply 1 application topically 2 (two) times daily. (Patient not taking: Reported on 04/29/2017) 113 g 5  . cyclobenzaprine (FLEXERIL) 10 MG tablet Take 1 tablet (10 mg total) by mouth every 8 (eight) hours as needed for muscle spasms. 30 tablet 1  . fluconazole (DIFLUCAN) 200 MG tablet Take 1 tablet (200 mg total) by mouth every other day. (Patient not taking: Reported on 04/29/2017) 3 tablet 5  . methocarbamol (ROBAXIN) 500 MG tablet Take 1 tablet (500 mg total) by mouth every 8 (eight) hours as needed for muscle spasms. 30 tablet 2  . nitrofurantoin, macrocrystal-monohydrate, (  MACROBID) 100 MG capsule Take 1 capsule (100 mg total) by mouth 2 (two) times daily. (Patient not taking: Reported on 10/19/2016) 14 capsule 0  . nortriptyline (PAMELOR) 50 MG capsule 2 tabs po qhs (Patient not taking: Reported on 06/01/2017) 60 capsule 11  . NOVOLOG MIX 70/30 FLEXPEN (70-30) 100 UNIT/ML FlexPen INJECT 24 UNITS SUBCUTANEOUSLY BID  3  . omeprazole (PRILOSEC) 20 MG capsule Take 1 capsule (20 mg total) by mouth 2 (two) times daily before a meal. (Patient not taking: Reported on 06/01/2017) 60 capsule 11  . ondansetron (ZOFRAN ODT) 4 MG disintegrating tablet Take 1 tablet (4 mg total) by mouth every 8 (eight) hours as needed. (Patient not taking: Reported on 04/29/2017) 20  tablet 11  . oxyCODONE (OXY IR/ROXICODONE) 5 MG immediate release tablet Take 1-2 tablets (5-10 mg total) by mouth every 6 (six) hours as needed for moderate pain or severe pain. (Patient not taking: Reported on 04/29/2017) 30 tablet 0  . propranolol (INDERAL) 60 MG tablet Take 60 mg by mouth daily.    . rizatriptan (MAXALT-MLT) 10 MG disintegrating tablet Take 1 tablet (10 mg total) by mouth as needed for migraine. May repeat in 2 hours if needed (Patient not taking: Reported on 04/29/2017) 12 tablet 11  . sucralfate (CARAFATE) 1 GM/10ML suspension Take 10 mLs (1 g total) by mouth 4 (four) times daily -  with meals and at bedtime. (Patient not taking: Reported on 10/19/2016) 420 mL 0   No current facility-administered medications for this visit.     Review of Systems Review of Systems Constitutional: negative for fatigue and weight loss Respiratory: negative for cough and wheezing Cardiovascular: negative for chest pain, fatigue and palpitations Gastrointestinal: negative for abdominal pain and change in bowel habits Genitourinary:positive for pelvic pain Integument/breast: negative for nipple discharge Musculoskeletal:positive for myalgias Neurological: negative for gait problems and tremors Behavioral/Psych: negative for abusive relationship, depression Endocrine: negative for temperature intolerance      Blood pressure (!) 140/95, pulse 96, temperature 98 F (36.7 C), temperature source Oral, weight 299 lb 9.6 oz (135.9 kg).  Physical Exam Physical Exam           General:  Alert and no distress Abdomen:  normal findings: no organomegaly, soft, non-tender and no hernia  Pelvis:  External genitalia: normal general appearance Urinary system: urethral meatus normal and bladder without fullness, nontender Vaginal: normal without tenderness, induration or masses Cervix: normal appearance Adnexa: positive LLQ tenderness to deep palpation.  No masses. Uterus: anteverted and non-tender,  normal size    50% of 15 min visit spent on counseling and coordination of care.   Data Reviewed Wet Prep Previous Ultrasound  Assessment     1. Pelvic pain in female Rx: - POCT Urinalysis Dipstick - US PELVIC COMPLETE WITH TRANSVAGINAL; Future - methocarbamol (ROBAXIN) 500 MG tablet; Take 1 tablet (500 mg total) by mouth every 8 (eight) hours as needed for muscle spasms.  Dispense: 30 tablet; Refill: 2  2. Backache with radiation Rx: - methocarbamol (ROBAXIN) 500 MG tablet; Take 1 tablet (500 mg total) by mouth every 8 (eight) hours as needed for muscle spasms.  Dispense: 30 tablet; Refill: 2 - cyclobenzaprine (FLEXERIL) 10 MG tablet; Take 1 tablet (10 mg total) by mouth every 8 (eight) hours as needed for muscle spasms.  Dispense: 30 tablet; Refill: 1  3. Class 3 severe obesity due to excess calories without serious comorbidity with body mass index (BMI) of 40.0 to 44.9 in adult [facility])  Plan    FOLLOW UP IN 2 WEEKS  Orders Placed This Encounter  Procedures  . US PELVIC COMPLETE WITH TRANSVAGINAL    Standing Status:   Future    Standing Expiration Date:   08/02/2018    Order Specific Question:   Reason for Exam (SYMPTOM  OR DIAGNOSIS REQUIRED)    Answer:   Pelvic pain, LLQ.    Order Specific Question:   Preferred imaging location?    Answer:   [facility]  . POCT Urinalysis Dipstick   Meds ordered this encounter  Medications  . methocarbamol (ROBAXIN) 500 MG tablet    Sig: Take 1 tablet (500 mg total) by mouth every 8 (eight) hours as needed for muscle spasms.    Dispense:  30 tablet    Refill:  2  . cyclobenzaprine (FLEXERIL) 10 MG tablet    Sig: Take 1 tablet (10 mg total) by mouth every 8 (eight) hours as needed for muscle spasms.    Dispense:  30 tablet    Refill:  1     Shelly Bombard MD 06-01-2017

## 2017-06-08 ENCOUNTER — Ambulatory Visit (HOSPITAL_COMMUNITY): Admission: RE | Admit: 2017-06-08 | Payer: Medicaid Other | Source: Ambulatory Visit

## 2017-06-14 ENCOUNTER — Ambulatory Visit (HOSPITAL_COMMUNITY): Payer: Medicaid Other

## 2017-06-15 ENCOUNTER — Ambulatory Visit: Payer: Medicaid Other | Admitting: Obstetrics

## 2017-06-16 ENCOUNTER — Ambulatory Visit (HOSPITAL_COMMUNITY)
Admission: RE | Admit: 2017-06-16 | Discharge: 2017-06-16 | Disposition: A | Discharge status: Home / Self Care | Payer: Medicaid Other | Source: Ambulatory Visit | Attending: Obstetrics | Admitting: Obstetrics

## 2017-06-16 DIAGNOSIS — D259 Leiomyoma of uterus, unspecified: Secondary | ICD-10-CM | POA: Diagnosis not present

## 2017-06-16 DIAGNOSIS — R102 Pelvic and perineal pain: Secondary | ICD-10-CM | POA: Diagnosis not present

## 2017-06-18 ENCOUNTER — Ambulatory Visit: Payer: Medicaid Other | Admitting: Obstetrics

## 2017-06-18 ENCOUNTER — Encounter: Payer: Self-pay | Admitting: Obstetrics

## 2017-06-18 VITALS — BP 148/92 | HR 92 | Wt 306.6 lb

## 2017-06-18 DIAGNOSIS — Z6841 Body Mass Index (BMI) 40.0 and over, adult: Secondary | ICD-10-CM

## 2017-06-18 DIAGNOSIS — R102 Pelvic and perineal pain: Secondary | ICD-10-CM

## 2017-06-18 DIAGNOSIS — M549 Dorsalgia, unspecified: Secondary | ICD-10-CM

## 2017-06-18 NOTE — Progress Notes (Signed)
Patient ID: Sarah Goodman, female   DOB: Mar 24, 1965, 52 y.o.   MRN: [id number]  Chief Complaint  Patient presents with  . Routine Prenatal Visit    HPI Sarah Goodman is a 52 y.o. female.  History of LLQ pelvic pain, and backache that radiates down left leg, with pain and numbness of left leg. HPI  Past Medical History:  Diagnosis Date  . Chest pain    a. 05/2012 Cath: LM nl, LAD nl, LCX nl, RCA nl, EF 65%;  b. 02/2015 Echo: EF 60-65%, no rwma, Gr 1 DD, nl LA size; c. 02/2015 CTA chest: No PE.  Marland Kitchen Endometriosis   . GERD (gastroesophageal reflux disease)   . Headache(784.0)    migraines  . Hypertension   . Memory difficulty   . Migraine   . Morbid obesity ([place])   . Positive D dimer 03/2015  . Type II diabetes mellitus ([place])     Past Surgical History:  Procedure Laterality Date  . CHOLECYSTECTOMY N/A 10/19/2016   Procedure: LAPAROSCOPIC CHOLECYSTECTOMY;  Surgeon: Georganna Skeans, MD;  Location: [facility];  Service: General;  Laterality: N/A;  . COLPOSCOPY  12/18/2011   Procedure: COLPOSCOPY;  Surgeon: Lahoma Crocker, MD;  Location: [facility];  Service: Gynecology;  Laterality: N/A;  . HYSTEROSCOPY W/D&C  12/18/2011   Procedure: DILATATION AND CURETTAGE /HYSTEROSCOPY;  Surgeon: Lahoma Crocker, MD;  Location: [facility];  Service: Gynecology;  Laterality: N/A;  . LEFT HEART CATHETERIZATION WITH CORONARY ANGIOGRAM N/A 05/26/2012   Procedure: LEFT HEART CATHETERIZATION WITH CORONARY ANGIOGRAM;  Surgeon: Sinclair Grooms, MD;  Location: [facility] CATH LAB;  Service: Cardiovascular;  Laterality: N/A;    Family History  Problem Relation Age of Onset  . Diabetes Mother   . Hypertension Mother   . CAD Mother 34       Early onset CAD, with "blockage in artery and stent in heart"    Social History Social History   Tobacco Use  . Smoking status: Never Smoker  . Smokeless tobacco: Never Used  Substance Use Topics  . Alcohol use: No    Alcohol/week: 0.0 oz  . Drug use: No    No Known  Allergies  Current Outpatient Medications  Medication Sig Dispense Refill  . cyclobenzaprine (FLEXERIL) 10 MG tablet Take 1 tablet (10 mg total) by mouth every 8 (eight) hours as needed for muscle spasms. 30 tablet 1  . LEVEMIR FLEXTOUCH 100 UNIT/ML Pen Inject 20 Units into the skin at bedtime.   0  . lisinopril (PRINIVIL,ZESTRIL) 20 MG tablet Take 20 mg by mouth daily.    . metFORMIN (GLUCOPHAGE) 850 MG tablet Take 1 tablet (850 mg total) by mouth 2 (two) times daily with a meal.    . NOVOLOG MIX 70/30 FLEXPEN (70-30) 100 UNIT/ML FlexPen INJECT 24 UNITS SUBCUTANEOUSLY BID  3  . albuterol (PROVENTIL HFA;VENTOLIN HFA) 108 (90 BASE) MCG/ACT inhaler Inhale 2 puffs into the lungs 3 (three) times daily as needed for wheezing or shortness of breath.     . Cefixime (SUPRAX) 400 MG CAPS capsule Take 1 capsule (400 mg total) by mouth daily. (Patient not taking: Reported on 06/01/2017) 7 capsule 1  . clotrimazole (LOTRIMIN) 1 % cream Apply 1 application topically 2 (two) times daily. (Patient not taking: Reported on 04/29/2017) 113 g 5  . fluconazole (DIFLUCAN) 200 MG tablet Take 1 tablet (200 mg total) by mouth every other day. (Patient not taking: Reported on 04/29/2017) 3 tablet 5  . methocarbamol (ROBAXIN) 500  MG tablet Take 1 tablet (500 mg total) by mouth every 8 (eight) hours as needed for muscle spasms. (Patient not taking: Reported on 06/18/2017) 30 tablet 2  . nitrofurantoin, macrocrystal-monohydrate, (MACROBID) 100 MG capsule Take 1 capsule (100 mg total) by mouth 2 (two) times daily. (Patient not taking: Reported on 10/19/2016) 14 capsule 0  . nortriptyline (PAMELOR) 50 MG capsule 2 tabs po qhs (Patient not taking: Reported on 06/01/2017) 60 capsule 11  . omeprazole (PRILOSEC) 20 MG capsule Take 1 capsule (20 mg total) by mouth 2 (two) times daily before a meal. (Patient not taking: Reported on 06/01/2017) 60 capsule 11  . ondansetron (ZOFRAN ODT) 4 MG disintegrating tablet Take 1 tablet (4 mg total) by  mouth every 8 (eight) hours as needed. (Patient not taking: Reported on 04/29/2017) 20 tablet 11  . oxyCODONE (OXY IR/ROXICODONE) 5 MG immediate release tablet Take 1-2 tablets (5-10 mg total) by mouth every 6 (six) hours as needed for moderate pain or severe pain. (Patient not taking: Reported on 04/29/2017) 30 tablet 0  . propranolol (INDERAL) 60 MG tablet Take 60 mg by mouth daily.    . rizatriptan (MAXALT-MLT) 10 MG disintegrating tablet Take 1 tablet (10 mg total) by mouth as needed for migraine. May repeat in 2 hours if needed (Patient not taking: Reported on 04/29/2017) 12 tablet 11  . sucralfate (CARAFATE) 1 GM/10ML suspension Take 10 mLs (1 g total) by mouth 4 (four) times daily -  with meals and at bedtime. (Patient not taking: Reported on 10/19/2016) 420 mL 0   No current facility-administered medications for this visit.     Review of Systems Review of Systems Constitutional: negative for fatigue and weight loss Respiratory: negative for cough and wheezing Cardiovascular: negative for chest pain, fatigue and palpitations Gastrointestinal: negative for abdominal pain and change in bowel habits Genitourinary:negative Integument/breast: negative for nipple discharge Musculoskeletal:negative for myalgias Neurological: negative for gait problems and tremors Behavioral/Psych: negative for abusive relationship, depression Endocrine: negative for temperature intolerance      Blood pressure (!) 148/92, pulse 92, weight (!) 306 lb 9.6 oz (139.1 kg), last menstrual period 03/27/2016.  Physical Exam Physical Exam General:   alert  Skin:   no rash or abnormalities  Lungs:   clear to auscultation bilaterally  Heart:   regular rate and rhythm, S1, S2 normal, no murmur, click, rub or gallop  Breasts:   normal without suspicious masses, skin or nipple changes or axillary nodes  Abdomen:  normal findings: no organomegaly, soft, non-tender and no hernia  Pelvis:  External genitalia: normal  general appearance Urinary system: urethral meatus normal and bladder without fullness, nontender Vaginal: normal without tenderness, induration or masses Cervix: normal appearance Adnexa: normal bimanual exam Uterus: anteverted and non-tender, normal size    >50% of 15 min visit spent on counseling and coordination of care.   Data Reviewed Ultrasound: US PELVIC COMPLETE WITH TRANSVAGINAL (Accession [id number]) (Order [id number])  Imaging  Date: 06/16/2017 Department: [facility] Released By: Enriqueta Shutter, RT Authorizing: Shelly Bombard, MD  Exam Information   Status Exam Begun  Exam Ended   Final [99] 06/16/2017 10:45 AM 06/16/2017 11:31 AM  PACS Images   Show images for US PELVIC COMPLETE WITH TRANSVAGINAL  Study Result   CLINICAL DATA:  LEFT lower quadrant pelvic pain in a female  EXAM: TRANSABDOMINAL AND TRANSVAGINAL ULTRASOUND OF PELVIS  TECHNIQUE: Both transabdominal and transvaginal ultrasound examinations of the pelvis were performed. Transabdominal technique was performed  for global imaging of the pelvis including uterus, ovaries, adnexal regions, and pelvic cul-de-sac. It was necessary to proceed with endovaginal exam following the transabdominal exam to visualize the uterus, endometrium and ovaries.  COMPARISON:  04/09/2015  FINDINGS: Uterus  Measurements: 9.7 x 5.5 x 6.2 cm. Small nabothian cysts at cervix. Small anterior subserosal uterine leiomyoma 14 x 7 x 12 mm. No additional uterine masses.  Endometrium  Thickness: 6 mm thick.  No endometrial fluid or focal abnormality  Right ovary  Measurements: 3.8 x 1.5 x 1.8 cm.  Normal morphology without mass.  Left ovary  Measurements: 2.7 x 1.4 x 1.4 cm.  Normal morphology without mass  Other findings  No free pelvic fluid or adnexal masses.  Patient's pain appears to be centered at the LEFT lateral margin of the uterus.  Blood flow appears present  within the margin of LEFT ovary on color Doppler imaging.  IMPRESSION: Small anterior wall uterine leiomyoma 14 mm greatest size.  Otherwise negative exam.   Electronically Signed   By: Lavonia Dana M.D.   On: 06/16/2017 15:46     Assessment     1. Pelvic pain in female - resolved  2. Backache with radiation Rx: - Ambulatory referral to Orthopedics  3. Class 3 severe obesity due to excess calories without serious comorbidity with body mass index (BMI) of 40.0 to 44.9 in adult [facility]) - program of caloric reduction, exercise and behavioral modification recommended    Plan    Follow up prn  No orders of the defined types were placed in this encounter.  No orders of the defined types were placed in this encounter.   Shelly Bombard MD 06-18-2017

## 2017-07-01 IMAGING — NM NM HEPATO W/GB/PHARM/[PERSON_NAME]
2 series · 12 of 12 positions shown · non-contrast
Comparison: Abdominal ultrasound May 20, 2016

CLINICAL DATA: Intermittent left upper quadrant abdominal pain with
nausea and vomiting made worse with fatty foods. Duration of
symptoms 1 year.

EXAM:
NUCLEAR MEDICINE HEPATOBILIARY IMAGING WITH GALLBLADDER EF
TECHNIQUE: Sequential images of the abdomen were obtained [DATE] minutes
following intravenous administration of radiopharmaceutical. After
oral ingestion of Ensure, gallbladder ejection fraction was
determined. At 60 min, normal ejection fraction is greater than 33%.
RADIOPHARMACEUTICALS:  5.2 mCi 8c-XXm  Choletec IV

[he hepatobiliary · 3.43mm/px · 6 of 54 frames shown (1 of 2)]
[frame 5/54]
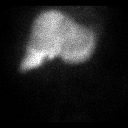
[frame 14/54]
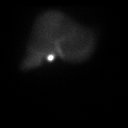
[frame 23/54]
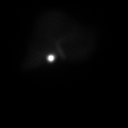
[frame 32/54]
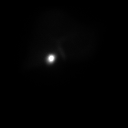
[frame 41/54]
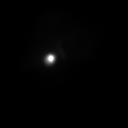
[frame 50/54]
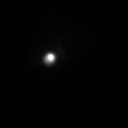

[he hepatobiliary · 3.43mm/px · 6 of 60 frames shown (2 of 2)]
[frame 6/60]
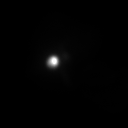
[frame 16/60]
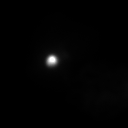
[frame 26/60]
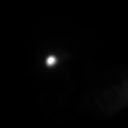
[frame 36/60]
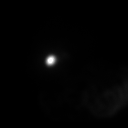
[frame 46/60]
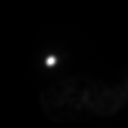
[frame 56/60]
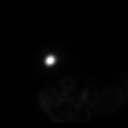

[12 of 12 positions shown; findings below may reference images not displayed]

FINDINGS: Prompt uptake and biliary excretion of activity by the liver is
seen. Gallbladder activity is visualized, consistent with patency of
cystic duct. Biliary activity passes into small bowel, consistent
with patent common bile duct.

Calculated gallbladder ejection fraction is 31%%. (Normal
gallbladder ejection fraction with Ensure is greater than 33%.)
IMPRESSION: No evidence of cystic duct or common bile duct obstruction.
Abnormally low gallbladder ejection fraction of 31%. The patient
reported discomfort after ingesting Ensure.

## 2017-08-16 ENCOUNTER — Other Ambulatory Visit: Payer: Self-pay | Admitting: Obstetrics

## 2017-08-16 DIAGNOSIS — M549 Dorsalgia, unspecified: Secondary | ICD-10-CM

## 2017-09-08 ENCOUNTER — Encounter: Payer: Self-pay | Admitting: *Deleted

## 2017-12-02 ENCOUNTER — Other Ambulatory Visit: Payer: Self-pay | Admitting: Obstetrics

## 2017-12-02 DIAGNOSIS — R102 Pelvic and perineal pain: Secondary | ICD-10-CM

## 2017-12-02 DIAGNOSIS — M549 Dorsalgia, unspecified: Secondary | ICD-10-CM

## 2017-12-09 ENCOUNTER — Ambulatory Visit: Payer: Medicaid Other | Admitting: Obstetrics

## 2017-12-21 ENCOUNTER — Ambulatory Visit: Payer: Medicaid Other | Admitting: Obstetrics

## 2018-02-10 ENCOUNTER — Encounter (HOSPITAL_COMMUNITY): Payer: Self-pay

## 2018-02-10 ENCOUNTER — Other Ambulatory Visit: Payer: Self-pay

## 2018-02-10 ENCOUNTER — Emergency Department (HOSPITAL_COMMUNITY): Payer: Medicaid Other

## 2018-02-10 ENCOUNTER — Observation Stay (HOSPITAL_COMMUNITY)
Admission: EM | Admit: 2018-02-10 | Discharge: 2018-02-11 | Disposition: A | Discharge status: Home / Self Care | Payer: Medicaid Other | Attending: Internal Medicine | Admitting: Internal Medicine

## 2018-02-10 DIAGNOSIS — G43909 Migraine, unspecified, not intractable, without status migrainosus: Secondary | ICD-10-CM | POA: Insufficient documentation

## 2018-02-10 DIAGNOSIS — E119 Type 2 diabetes mellitus without complications: Secondary | ICD-10-CM

## 2018-02-10 DIAGNOSIS — Z6841 Body Mass Index (BMI) 40.0 and over, adult: Secondary | ICD-10-CM | POA: Insufficient documentation

## 2018-02-10 DIAGNOSIS — Z79899 Other long term (current) drug therapy: Secondary | ICD-10-CM | POA: Insufficient documentation

## 2018-02-10 DIAGNOSIS — E876 Hypokalemia: Secondary | ICD-10-CM | POA: Diagnosis not present

## 2018-02-10 DIAGNOSIS — R072 Precordial pain: Secondary | ICD-10-CM

## 2018-02-10 DIAGNOSIS — K219 Gastro-esophageal reflux disease without esophagitis: Secondary | ICD-10-CM | POA: Insufficient documentation

## 2018-02-10 DIAGNOSIS — R000 Tachycardia, unspecified: Secondary | ICD-10-CM | POA: Insufficient documentation

## 2018-02-10 DIAGNOSIS — I10 Essential (primary) hypertension: Secondary | ICD-10-CM

## 2018-02-10 DIAGNOSIS — R9431 Abnormal electrocardiogram [ECG] [EKG]: Secondary | ICD-10-CM | POA: Insufficient documentation

## 2018-02-10 DIAGNOSIS — Z794 Long term (current) use of insulin: Secondary | ICD-10-CM | POA: Insufficient documentation

## 2018-02-10 DIAGNOSIS — R0789 Other chest pain: Principal | ICD-10-CM | POA: Insufficient documentation

## 2018-02-10 DIAGNOSIS — R079 Chest pain, unspecified: Secondary | ICD-10-CM | POA: Diagnosis present

## 2018-02-10 DIAGNOSIS — Z8249 Family history of ischemic heart disease and other diseases of the circulatory system: Secondary | ICD-10-CM | POA: Insufficient documentation

## 2018-02-10 LAB — BASIC METABOLIC PANEL
Anion gap: 13 (ref 5–15)
BUN: 10 mg/dL (ref 6–20)
CO2: 23 mmol/L (ref 22–32)
Calcium: 9.4 mg/dL (ref 8.9–10.3)
Chloride: 97 mmol/L — ABNORMAL LOW (ref 98–111)
Creatinine, Ser: 0.87 mg/dL (ref 0.44–1.00)
GFR calc Af Amer: >60 mL/min (ref >60)
GFR calc non Af Amer: >60 mL/min (ref >60)
Glucose, Bld: 228 mg/dL — ABNORMAL HIGH (ref 70–99)
Potassium: 3.4 mmol/L — ABNORMAL LOW (ref 3.5–5.1)
Sodium: 133 mmol/L — ABNORMAL LOW (ref 135–145)

## 2018-02-10 LAB — CBC
HCT: 41.6 % (ref 36.0–46.0)
Hemoglobin: 13.6 g/dL (ref 12.0–15.0)
MCH: 29 pg (ref 26.0–34.0)
MCHC: 32.7 g/dL (ref 30.0–36.0)
MCV: 88.7 fL (ref 80.0–100.0)
Platelets: 432 10*3/uL — ABNORMAL HIGH (ref 150–400)
RBC: 4.69 MIL/uL (ref 3.87–5.11)
RDW: 12.2 % (ref 11.5–15.5)
WBC: 9.4 10*3/uL (ref 4.0–10.5)
nRBC: 0 % (ref 0.0–0.2)

## 2018-02-10 LAB — I-STAT TROPONIN, ED: Troponin i, poc: 0.02 ng/mL (ref 0.00–0.08)

## 2018-02-10 LAB — D-DIMER, QUANTITATIVE: D-Dimer, Quant: 1.43 ug/mL-FEU — ABNORMAL HIGH (ref 0.00–0.50)

## 2018-02-10 LAB — I-STAT BETA HCG BLOOD, ED (MC, WL, AP ONLY): I-stat hCG, quantitative: <5 m[IU]/mL (ref <5)

## 2018-02-10 LAB — GLUCOSE, CAPILLARY: Glucose-Capillary: 169 mg/dL — ABNORMAL HIGH (ref 70–99)

## 2018-02-10 MED ORDER — IOPAMIDOL (ISOVUE-370) INJECTION 76%
INTRAVENOUS | Status: AC
  Filled 2018-02-10: qty 100

## 2018-02-10 MED ORDER — ASPIRIN 325 MG PO TABS
325.0000 mg | ORAL_TABLET | Freq: Once | ORAL | Status: AC
  Administered 2018-02-10: 325 mg via ORAL
  Filled 2018-02-10: qty 1

## 2018-02-10 MED ORDER — LIDOCAINE VISCOUS HCL 2 % MT SOLN
15.0000 mL | Freq: Once | OROMUCOSAL | Status: AC
  Administered 2018-02-10: 15 mL via ORAL
  Filled 2018-02-10: qty 15

## 2018-02-10 MED ORDER — ALUM & MAG HYDROXIDE-SIMETH 200-200-20 MG/5ML PO SUSP
30.0000 mL | Freq: Once | ORAL | Status: AC
  Administered 2018-02-10: 30 mL via ORAL
  Filled 2018-02-10: qty 30

## 2018-02-10 MED ORDER — IOPAMIDOL (ISOVUE-370) INJECTION 76%
100.0000 mL | Freq: Once | INTRAVENOUS | Status: AC | PRN
  Administered 2018-02-10: 100 mL via INTRAVENOUS

## 2018-02-10 NOTE — ED Triage Notes (Signed)
Pt presents to ED with Chest pressure starting 3 days ago.

## 2018-02-10 NOTE — ED Notes (Signed)
Patient transported to CT 

## 2018-02-10 NOTE — ED Provider Notes (Signed)
[facility] EMERGENCY DEPARTMENT Provider Note   CSN: [id number] Arrival date & time: 02/10/18  1340     History   Chief Complaint Chief Complaint  Patient presents with  . Chest Pain    HPI Sarah Goodman is a 53 y.o. female.  Sarah Goodman is a 53 y.o. female with a history of hypertension, diabetes, morbid obesity, GERD and migraines, who presents to the emergency department for evaluation of chest pain.  She describes pain as a pressure over the center of her chest which has been present for the past 3 days.  She reports it is constant but intermittently becomes more intense.  Pain is nonradiating.  Pain is not made better or worse by anything.  She has not noticed any worsening with exertion although has noted some exertional dyspnea worsening over the past 3 days.  No lower extremity swelling or pain.  Pain is worsened slightly with deep breathing.  She denies any associated cough, fevers or chills.  No lightheadedness or syncope.  No diaphoresis, nausea or vomiting.  No abdominal pain.  Patient reports that she took her home PPI without relief, reports this does not feel similar to her usual GERD symptoms.  No history of prior cardiac events, had chest pain work-up with negative cath back in 2014 and a normal echo in 2017, does not follow regularly with cardiologist.      Past Medical History:  Diagnosis Date  . Chest pain    a. 05/2012 Cath: LM nl, LAD nl, LCX nl, RCA nl, EF 65%;  b. 02/2015 Echo: EF 60-65%, no rwma, Gr 1 DD, nl LA size; c. 02/2015 CTA chest: No PE.  Marland Kitchen Endometriosis   . GERD (gastroesophageal reflux disease)   . Headache(784.0)    migraines  . Hypertension   . Memory difficulty   . Migraine   . Morbid obesity ([place])   . Positive D dimer 03/2015  . Type II diabetes mellitus [facility])     Patient Active Problem List   Diagnosis Date Noted  . Migraine 07/24/2015  . Postmenopausal bleeding 04/02/2015  . Nausea   . Morbid obesity ([place])    . Type II diabetes mellitus ([place])   . Hive 03/18/2015  . Positive D dimer 03/18/2015  . Chest pressure 03/17/2015  . Chest pain 03/17/2015  . Chronic migraine 02/27/2015  . Dysplasia of cervix, low grade (CIN 1) 08/05/2012  . UTI (urinary tract infection) 05/24/2012  . Dehydration 05/24/2012  . GERD (gastroesophageal reflux disease) 05/24/2012  . Morbid obesity with body mass index of 50.0-59.9 in adult ([place]) 05/24/2012  . Vertigo 05/23/2012    Class: Acute  . Chest discomfort 05/23/2012    Class: Acute  . Diabetes mellitus type II, uncontrolled ([place]) 05/23/2012  . Abnormal uterine bleeding 12/18/2011  . Low grade squamous intraepithelial lesion (LGSIL) on Pap smear 12/18/2011    Past Surgical History:  Procedure Laterality Date  . CHOLECYSTECTOMY N/A 10/19/2016   Procedure: LAPAROSCOPIC CHOLECYSTECTOMY;  Surgeon: Georganna Skeans, MD;  Location: [place];  Service: General;  Laterality: N/A;  . COLPOSCOPY  12/18/2011   Procedure: COLPOSCOPY;  Surgeon: Lahoma Crocker, MD;  Location: [facility];  Service: Gynecology;  Laterality: N/A;  . HYSTEROSCOPY W/D&C  12/18/2011   Procedure: DILATATION AND CURETTAGE /HYSTEROSCOPY;  Surgeon: Lahoma Crocker, MD;  Location: [facility];  Service: Gynecology;  Laterality: N/A;  . LEFT HEART CATHETERIZATION WITH CORONARY ANGIOGRAM N/A 05/26/2012   Procedure: LEFT HEART CATHETERIZATION WITH CORONARY ANGIOGRAM;  Surgeon: Sinclair Grooms, MD;  Location: [facility] CATH LAB;  Service: Cardiovascular;  Laterality: N/A;     OB History    Gravida  4   Para  4   Term      Preterm      AB      Living  4     SAB      TAB      Ectopic      Multiple      Live Births  4            Home Medications    Prior to Admission medications   Medication Sig Start Date End Date Taking? Authorizing Provider  albuterol (PROVENTIL HFA;VENTOLIN HFA) 108 (90 BASE) MCG/ACT inhaler Inhale 2 puffs into the lungs 3 (three) times daily as needed for wheezing or  shortness of breath.     [provider]  Cefixime (SUPRAX) 400 MG CAPS capsule Take 1 capsule (400 mg total) by mouth daily. Patient not taking: Reported on 06/01/2017 04/30/17   Shelly Bombard, MD  clotrimazole (LOTRIMIN) 1 % cream Apply 1 application topically 2 (two) times daily. Patient not taking: Reported on 04/29/2017 04/27/16   Shelly Bombard, MD  cyclobenzaprine (FLEXERIL) 10 MG tablet TAKE 1 TABLET (10 MG TOTAL) BY MOUTH EVERY 8 (EIGHT) HOURS AS NEEDED FOR MUSCLE SPASMS. 08/17/17   Shelly Bombard, MD  fluconazole (DIFLUCAN) 200 MG tablet Take 1 tablet (200 mg total) by mouth every other day. Patient not taking: Reported on 04/29/2017 04/27/16   Shelly Bombard, MD  LEVEMIR FLEXTOUCH 100 UNIT/ML Pen Inject 20 Units into the skin at bedtime.  03/10/16   [provider]  lisinopril (PRINIVIL,ZESTRIL) 20 MG tablet Take 20 mg by mouth daily.    [provider]  metFORMIN (GLUCOPHAGE) 850 MG tablet Take 1 tablet (850 mg total) by mouth 2 (two) times daily with a meal. 05/29/12   [name], NP  methocarbamol (ROBAXIN) 500 MG tablet TAKE 1 TABLET (500 MG TOTAL) BY MOUTH EVERY 8 (EIGHT) HOURS AS NEEDED FOR MUSCLE SPASMS. 12/04/17   Shelly Bombard, MD  nitrofurantoin, macrocrystal-monohydrate, (MACROBID) 100 MG capsule Take 1 capsule (100 mg total) by mouth 2 (two) times daily. Patient not taking: Reported on 10/19/2016 10/14/16   Constant, Peggy, MD  nortriptyline (PAMELOR) 50 MG capsule 2 tabs po qhs Patient not taking: Reported on 06/01/2017 02/27/15   [name], MD  NOVOLOG MIX 70/30 FLEXPEN (70-30) 100 UNIT/ML FlexPen INJECT 24 UNITS SUBCUTANEOUSLY BID 12/19/14   [provider]  omeprazole (PRILOSEC) 20 MG capsule Take 1 capsule (20 mg total) by mouth 2 (two) times daily before a meal. Patient not taking: Reported on 06/01/2017 04/27/16   Shelly Bombard, MD  ondansetron (ZOFRAN ODT) 4 MG disintegrating tablet Take 1 tablet (4 mg total) by mouth  every 8 (eight) hours as needed. Patient not taking: Reported on 04/29/2017 10/28/15   [name], MD  oxyCODONE (OXY IR/ROXICODONE) 5 MG immediate release tablet Take 1-2 tablets (5-10 mg total) by mouth every 6 (six) hours as needed for moderate pain or severe pain. Patient not taking: Reported on 04/29/2017 10/19/16   Georganna Skeans, MD  propranolol (INDERAL) 60 MG tablet Take 60 mg by mouth daily.    [provider]  rizatriptan (MAXALT-MLT) 10 MG disintegrating tablet Take 1 tablet (10 mg total) by mouth as needed for migraine. May repeat in 2 hours if needed Patient not taking: Reported  on 04/29/2017 10/28/15   [name], MD  sucralfate (CARAFATE) 1 GM/10ML suspension Take 10 mLs (1 g total) by mouth 4 (four) times daily -  with meals and at bedtime. Patient not taking: Reported on 10/19/2016 05/21/16   Ward, Delice Bison, DO    Family History Family History  Problem Relation Age of Onset  . Diabetes Mother   . Hypertension Mother   . CAD Mother 59       Early onset CAD, with "blockage in artery and stent in heart"    Social History Social History   Tobacco Use  . Smoking status: Never Smoker  . Smokeless tobacco: Never Used  Substance Use Topics  . Alcohol use: No    Alcohol/week: 0.0 standard drinks  . Drug use: No     Allergies   Patient has no known allergies.   Review of Systems Review of Systems  Constitutional: Negative for chills and fever.  HENT: Negative.   Eyes: Negative for visual disturbance.  Respiratory: Positive for shortness of breath. Negative for cough, chest tightness and wheezing.   Cardiovascular: Positive for chest pain. Negative for palpitations and leg swelling.  Gastrointestinal: Negative for abdominal pain, diarrhea, nausea and vomiting.  Genitourinary: Negative for dysuria and frequency.  Musculoskeletal: Negative for arthralgias and myalgias.  Skin: Negative for color change and rash.  Neurological: Negative for dizziness, syncope and  light-headedness.     Physical Exam Updated Vital Signs BP (!) 136/94 (BP Location: Right Arm)   Pulse (!) 115   Temp 98 F (36.7 C) (Oral)   Resp 19   Ht 5\' 7"  (1.702 m)   Wt (!) 165.6 kg   LMP 03/27/2016   SpO2 98%   BMI 57.17 kg/m   Physical Exam Vitals signs and nursing note reviewed.  Constitutional:      General: She is not in acute distress.    Appearance: She is well-developed. She is obese. She is not ill-appearing or diaphoretic.  HENT:     Head: Normocephalic and atraumatic.  Eyes:     General:        Right eye: No discharge.        Left eye: No discharge.     Pupils: Pupils are equal, round, and reactive to light.  Neck:     Musculoskeletal: Neck supple.     Vascular: No JVD.     Trachea: No tracheal deviation.  Cardiovascular:     Rate and Rhythm: Normal rate and regular rhythm.     Pulses:          Radial pulses are 2+ on the right side and 2+ on the left side.       Dorsalis pedis pulses are 2+ on the right side and 2+ on the left side.     Heart sounds: Normal heart sounds. No murmur. No friction rub. No gallop.   Pulmonary:     Effort: Pulmonary effort is normal. No respiratory distress.     Breath sounds: Normal breath sounds. No wheezing or rales.     Comments: Respirations equal and unlabored, patient able to speak in full sentences, lungs clear to auscultation bilaterally, chest wall NTTP Abdominal:     General: Bowel sounds are normal. There is no distension.     Palpations: Abdomen is soft. There is no mass.     Tenderness: There is no abdominal tenderness. There is no guarding.     Comments: Abdomen soft, nondistended, nontender to palpation in all quadrants  without guarding or peritoneal signs  Musculoskeletal:        General: No deformity.     Right lower leg: She exhibits no tenderness. No edema.     Left lower leg: She exhibits no tenderness. No edema.  Skin:    General: Skin is warm and dry.     Capillary Refill: Capillary refill  takes less than 2 seconds.  Neurological:     Mental Status: She is alert and oriented to person, place, and time.     Coordination: Coordination normal.     Comments: Speech is clear, able to follow commands CN III-XII intact Normal strength in upper and lower extremities bilaterally including dorsiflexion and plantar flexion, strong and equal grip strength Sensation normal to light and sharp touch Moves extremities without ataxia, coordination intact   Psychiatric:        Mood and Affect: Mood normal.        Behavior: Behavior normal.      ED Treatments / Results  Labs (all labs ordered are listed, but only abnormal results are displayed) Labs Reviewed  BASIC METABOLIC PANEL - Abnormal; Notable for the following components:      Result Value   Sodium 133 (*)    Potassium 3.4 (*)    Chloride 97 (*)    Glucose, Bld 228 (*)    All other components within normal limits  CBC - Abnormal; Notable for the following components:   Platelets 432 (*)    All other components within normal limits  I-STAT TROPONIN, ED  I-STAT BETA HCG BLOOD, ED ([facility], [facility], [facility] ONLY)    EKG EKG Interpretation  Date/Time:  Thursday February 10 2018 13:49:27 EST Ventricular Rate:  112 PR Interval:  146 QRS Duration: 82 QT Interval:  334 QTC Calculation: 455 R Axis:   0 Text Interpretation:  Sinus tachycardia Right atrial enlargement Borderline ECG rate is faster compared to Sarah 2018 Confirmed by Sherwood Gambler [id number]) on 02/10/2018 5:14:04 PM   Radiology Dg Chest 2 View  Result Date: 02/10/2018 CLINICAL DATA:  Chest pain and shortness of breath EXAM: CHEST - 2 VIEW COMPARISON:  Sarah 11, 2018 FINDINGS: The lungs are clear. The heart size and pulmonary vascularity are normal. No adenopathy. No bone lesions. No pneumothorax. IMPRESSION: No edema or consolidation. Electronically Signed   By: Lowella Grip III M.D.   On: 02/10/2018 14:31   Ct Angio Chest Pe W And/or Wo Contrast  Result Date:  02/10/2018 CLINICAL DATA:  Chest pressure starting 3 days ago. EXAM: CT ANGIOGRAPHY CHEST WITH CONTRAST TECHNIQUE: Multidetector CT imaging of the chest was performed using the standard protocol during bolus administration of intravenous contrast. Multiplanar CT image reconstructions and MIPs were obtained to evaluate the vascular anatomy. CONTRAST:  56 mL ISOVUE-370 IOPAMIDOL (ISOVUE-370) INJECTION 76% COMPARISON:  Current chest radiographs.  Chest CT, 03/17/2015. FINDINGS: Cardiovascular: There is satisfactory pulmonary artery opacification to the proximal segmental level. There is no evidence of a pulmonary embolism. Heart is normal in size and configuration. No pericardial effusion. No coronary artery calcifications. Great vessels are normal in caliber. No aortic dissection or atherosclerosis. Mediastinum/Nodes: No neck base axillary masses or enlarged lymph nodes. There are calcified subcentimeter nodes in the superior and anterior mediastinum. No pathologically enlarged lymph nodes. Trachea and esophagus are unremarkable. Lungs/Pleura: Study appears expiratory. There are areas of ground-glass opacity most evident in the lower lobes with intervening areas of relative lucency, an overall mosaic pattern. This is most likely due to small  airways disease and air trapping. No evidence of pulmonary edema. No lung mass or significant nodule. No pleural effusion or pneumothorax. Upper Abdomen: No acute abnormality. Musculoskeletal: No fracture or acute finding. No osteoblastic or osteolytic lesions. Review of the MIP images confirms the above findings. IMPRESSION: 1. No evidence of a pulmonary embolism. 2. Mosaic attenuation in the lungs, most evident in the lower lobes, which is most likely due to small airways disease and air trapping. Infection is felt unlikely. There is no evidence of pulmonary edema. Electronically Signed   By: Lajean Manes M.D.   On: 02/10/2018 20:33     Procedures Procedures (including  critical care time)  Medications Ordered in ED Medications  alum & mag hydroxide-simeth (MAALOX/MYLANTA) 200-200-20 MG/5ML suspension 30 mL (30 mLs Oral Given 02/10/18 1751)    And  lidocaine (XYLOCAINE) 2 % viscous mouth solution 15 mL (15 mLs Oral Given 02/10/18 1751)  aspirin tablet 325 mg (325 mg Oral Given 02/10/18 1917)  iopamidol (ISOVUE-370) 76 % injection 100 mL (100 mLs Intravenous Contrast Given 02/10/18 1953)     Initial Impression / Assessment and Plan / ED Course  I have reviewed the triage vital signs and the nursing notes.  Pertinent labs & imaging results that were available during my care of the patient were reviewed by me and considered in my medical decision making (see chart for details).  Patient presents to the emergency department for 3 days of substernal chest pain with associated exertional dyspnea.  No previous cardiac history but patient has cardiac risk factors including obesity, hypertension and diabetes.  Denies smoking history.  On arrival patient tachycardic to 115 but vitals otherwise normal.  Still having some mild chest pressure on arrival, unrelieved with sublingual nitro with EMS.  Patient tachycardic on arrival and reporting some shortness of breath which does raise concern for PE.  Chest pain also has characteristics concerning for ACS.  Patient does have history of GERD, took PPI without relief in her symptoms.  Has not tried anything else to treat symptoms.  Will get basic labs, troponin, EKG, d-dimer and chest x-ray will give aspirin and GI cocktail.  EKG without concerning ischemic changes, sinus rhythm.  Troponin negative.  Labs overall reassuring with no leukocytosis, normal hemoglobin, no acute electrolyte derangements requiring intervention.  Normal renal function.  Negative pregnancy.  Chest x-ray without active cardiopulmonary disease.  D-dimer is elevated at 1.43 will proceed with CT angio of the chest.  CT angio shows no evidence of PE.  Does show some  mosaic attenuation most consistent with small airway disease.  No other acute findings within the chest.  Patient currently chest pain-free but does have a heart pathway score of 4, given this would recommend admission for chest pain rule out, discussed this plan with the patient and she is in agreement, will consult for medicine admission.  Case discussed with Dr. Marlowe Sax who will see and admit the patient.  Final Clinical Impressions(s) / ED Diagnoses   Final diagnoses:  Substernal chest pain    ED Discharge Orders    None       Janet Berlin 02/12/18 2343    Malvin Johns, MD 02/13/18 [phone]

## 2018-02-11 ENCOUNTER — Observation Stay (HOSPITAL_BASED_OUTPATIENT_CLINIC_OR_DEPARTMENT_OTHER): Payer: Medicaid Other

## 2018-02-11 ENCOUNTER — Encounter (HOSPITAL_COMMUNITY): Payer: Self-pay | Admitting: Physician Assistant

## 2018-02-11 DIAGNOSIS — Z794 Long term (current) use of insulin: Secondary | ICD-10-CM | POA: Diagnosis not present

## 2018-02-11 DIAGNOSIS — R072 Precordial pain: Secondary | ICD-10-CM

## 2018-02-11 DIAGNOSIS — E1165 Type 2 diabetes mellitus with hyperglycemia: Secondary | ICD-10-CM | POA: Diagnosis not present

## 2018-02-11 DIAGNOSIS — R079 Chest pain, unspecified: Secondary | ICD-10-CM

## 2018-02-11 DIAGNOSIS — R0789 Other chest pain: Secondary | ICD-10-CM | POA: Diagnosis not present

## 2018-02-11 DIAGNOSIS — E876 Hypokalemia: Secondary | ICD-10-CM

## 2018-02-11 DIAGNOSIS — I10 Essential (primary) hypertension: Secondary | ICD-10-CM

## 2018-02-11 LAB — TROPONIN I
Troponin I: <0.03 ng/mL (ref <0.03)
Troponin I: <0.03 ng/mL (ref <0.03)
Troponin I: <0.03 ng/mL (ref <0.03)

## 2018-02-11 LAB — ECHOCARDIOGRAM COMPLETE
Height: 67 in
Weight: 4878.4 oz

## 2018-02-11 LAB — MAGNESIUM: Magnesium: 1.5 mg/dL — ABNORMAL LOW (ref 1.7–2.4)

## 2018-02-11 LAB — BASIC METABOLIC PANEL
Anion gap: 9 (ref 5–15)
BUN: 9 mg/dL (ref 6–20)
CO2: 27 mmol/L (ref 22–32)
Calcium: 9.3 mg/dL (ref 8.9–10.3)
Chloride: 99 mmol/L (ref 98–111)
Creatinine, Ser: 0.82 mg/dL (ref 0.44–1.00)
GFR calc Af Amer: >60 mL/min (ref >60)
GFR calc non Af Amer: >60 mL/min (ref >60)
Glucose, Bld: 223 mg/dL — ABNORMAL HIGH (ref 70–99)
Potassium: 4 mmol/L (ref 3.5–5.1)
Sodium: 135 mmol/L (ref 135–145)

## 2018-02-11 LAB — GLUCOSE, CAPILLARY
Glucose-Capillary: 214 mg/dL — ABNORMAL HIGH (ref 70–99)
Glucose-Capillary: 176 mg/dL — ABNORMAL HIGH (ref 70–99)

## 2018-02-11 LAB — HEMOGLOBIN A1C
Hgb A1c MFr Bld: 9.1 % — ABNORMAL HIGH (ref 4.8–5.6)
Mean Plasma Glucose: 214.47 mg/dL

## 2018-02-11 MED ORDER — MAGNESIUM SULFATE 4 GM/100ML IV SOLN
4.0000 g | Freq: Once | INTRAVENOUS | Status: AC
  Administered 2018-02-11: 4 g via INTRAVENOUS
  Filled 2018-02-11: qty 100

## 2018-02-11 MED ORDER — CYCLOBENZAPRINE HCL 10 MG PO TABS: 10.0000 mg | ORAL_TABLET | Freq: Three times a day (TID) | ORAL | Status: DC | PRN

## 2018-02-11 MED ORDER — ONDANSETRON HCL 4 MG/2ML IJ SOLN: 4.0000 mg | Freq: Four times a day (QID) | INTRAMUSCULAR | Status: DC | PRN

## 2018-02-11 MED ORDER — INSULIN DETEMIR 100 UNIT/ML ~~LOC~~ SOLN
20.0000 [IU] | Freq: Every day | SUBCUTANEOUS | Status: DC
  Administered 2018-02-11: 20 [IU] via SUBCUTANEOUS
  Filled 2018-02-11 (×2): qty 0.2

## 2018-02-11 MED ORDER — ENOXAPARIN SODIUM 80 MG/0.8ML ~~LOC~~ SOLN
80.0000 mg | Freq: Every day | SUBCUTANEOUS | Status: DC
  Administered 2018-02-11: 80 mg via SUBCUTANEOUS
  Filled 2018-02-11: qty 0.8

## 2018-02-11 MED ORDER — ALUM & MAG HYDROXIDE-SIMETH 200-200-20 MG/5ML PO SUSP: 30.0000 mL | Freq: Four times a day (QID) | ORAL | Status: DC | PRN

## 2018-02-11 MED ORDER — INSULIN ASPART 100 UNIT/ML ~~LOC~~ SOLN
0.0000 [IU] | Freq: Three times a day (TID) | SUBCUTANEOUS | Status: DC
  Administered 2018-02-11: 3 [IU] via SUBCUTANEOUS
  Administered 2018-02-11: 5 [IU] via SUBCUTANEOUS

## 2018-02-11 MED ORDER — ENOXAPARIN SODIUM 80 MG/0.8ML ~~LOC~~ SOLN: 70.0000 mg | Freq: Every day | SUBCUTANEOUS | Status: DC

## 2018-02-11 MED ORDER — ALBUTEROL SULFATE (2.5 MG/3ML) 0.083% IN NEBU: 2.5000 mg | INHALATION_SOLUTION | Freq: Four times a day (QID) | RESPIRATORY_TRACT | Status: DC | PRN

## 2018-02-11 MED ORDER — ACETAMINOPHEN 325 MG PO TABS: 650.0000 mg | ORAL_TABLET | ORAL | Status: DC | PRN

## 2018-02-11 MED ORDER — LISINOPRIL 20 MG PO TABS
20.0000 mg | ORAL_TABLET | Freq: Every day | ORAL | Status: DC
  Administered 2018-02-11: 20 mg via ORAL
  Filled 2018-02-11: qty 1

## 2018-02-11 MED ORDER — PERFLUTREN LIPID MICROSPHERE
1.0000 mL | INTRAVENOUS | Status: AC | PRN
  Administered 2018-02-11: 2 mL via INTRAVENOUS
  Filled 2018-02-11: qty 10

## 2018-02-11 MED ORDER — PANTOPRAZOLE SODIUM 40 MG PO TBEC
40.0000 mg | DELAYED_RELEASE_TABLET | Freq: Every day | ORAL | Status: DC
  Administered 2018-02-11: 40 mg via ORAL
  Filled 2018-02-11: qty 1

## 2018-02-11 MED ORDER — POTASSIUM CHLORIDE CRYS ER 20 MEQ PO TBCR
40.0000 meq | EXTENDED_RELEASE_TABLET | Freq: Once | ORAL | Status: AC
  Administered 2018-02-11: 40 meq via ORAL
  Filled 2018-02-11: qty 2

## 2018-02-11 NOTE — Consult Note (Addendum)
Cardiology Consultation:   Patient ID: SADEEL FIDDLER; [id number]; Aug 03, 1965   Admit date: 02/10/2018 Date of Consult: 02/11/2018  Primary Care Provider: Benito Mccreedy, MD Primary Cardiologist: Linard Millers 2014, more recently seen in consult by Dr. Stanford Breed in 2017 Chief Complaint: chest pain  Patient Profile:   Sarah Goodman is a 53 y.o. female with a hx of chest pain with normal cath 2014 and no PE by CT 2017, GERD, morbid obesity, HTN, DM, migraines, endometriosis who is being seen today for the evaluation of chest pain at the request of Dr. Nevada Crane (chest pain unit protocol).  History of Present Illness:   She was remotely evaluated for chest pain dating back to at least April 2014. At that time, she underwent diagnostic catheterization revealing normal coronary arteries. In 2017 she was evaluated by the cardiology team for recurrent chest pain and had negative troponins. CT angio of the chest was negative for PE and echocardiogram was unrevealing (EF 60-65%, grade 1 DD). Chest pain was not felt consistent with cardiac etiology. Her baseline HR appears right on the border of sinus tach.  She presents back to the hospital with recurrent chest discomfort described as pressure, constant, for 3-4 days. She indicates she walks regularly but is limited by fatigue and dizziness - this specifically has not changed recently. She did notice more SOB with exertion than usual yesterday. She took her outpatient PPI and it made no relief. She took tums which did help her belch with improved the discomfort somewhat. She also indicates she felt some numbing relief of the pressure with GI cocktail.  The chest discomfort does not change with exertion. It did get worse when the echo probe was pushed on her chest. She is chest pain free now. Troponins neg x 3, Plt 432, d-dimer 1.43, normal WBC/Hgb. CT angio showed no evidence of PE, + No evidence of a pulmonary embolism; mosaic attenuation in the lungs,  most evident in the lower lobes, which is most likely due to small airways disease and air trapping. There were no coronary calcifications or aortic dissection noted. VSS. Does not smoke, drink or use drugs. Mother had stenting sometime in her 64s.  Echo tech just finished up echo and indicates that LVEF is preliminarily normal.   Past Medical History:  Diagnosis Date  . Chest pain    a. 05/2012 Cath: LM nl, LAD nl, LCX nl, RCA nl, EF 65%;  b. 02/2015 Echo: EF 60-65%, no rwma, Gr 1 DD, nl LA size; c. 02/2015 CTA chest: No PE.  Marland Kitchen Endometriosis   . GERD (gastroesophageal reflux disease)   . Headache(784.0)    migraines  . Hypertension   . Memory difficulty   . Migraine   . Morbid obesity ([place])   . Positive D dimer 03/2015  . Type II diabetes mellitus ([place])     Past Surgical History:  Procedure Laterality Date  . CHOLECYSTECTOMY N/A 10/19/2016   Procedure: LAPAROSCOPIC CHOLECYSTECTOMY;  Surgeon: Georganna Skeans, MD;  Location: [facility];  Service: General;  Laterality: N/A;  . COLPOSCOPY  12/18/2011   Procedure: COLPOSCOPY;  Surgeon: Lahoma Crocker, MD;  Location: [facility];  Service: Gynecology;  Laterality: N/A;  . HYSTEROSCOPY W/D&C  12/18/2011   Procedure: DILATATION AND CURETTAGE /HYSTEROSCOPY;  Surgeon: Lahoma Crocker, MD;  Location: [facility];  Service: Gynecology;  Laterality: N/A;  . LEFT HEART CATHETERIZATION WITH CORONARY ANGIOGRAM N/A 05/26/2012   Procedure: LEFT HEART CATHETERIZATION WITH CORONARY ANGIOGRAM;  Surgeon: Belva Crome III,  MD;  Location: [facility];  Service: Cardiovascular;  Laterality: N/A;     Inpatient Medications: Scheduled Meds: . enoxaparin (LOVENOX) injection  80 mg Subcutaneous Daily  . insulin aspart  0-15 Units Subcutaneous TID WC  . insulin detemir  20 Units Subcutaneous QHS  . lisinopril  20 mg Oral Daily  . pantoprazole  40 mg Oral Daily   Continuous Infusions: . magnesium sulfate 1 - 4 g bolus IVPB     PRN Meds: acetaminophen, albuterol, alum  & mag hydroxide-simeth, cyclobenzaprine, perflutren lipid microspheres (DEFINITY) IV suspension  Home Meds: Prior to Admission medications   Medication Sig Start Date End Date Taking? Authorizing Provider  albuterol (PROVENTIL HFA;VENTOLIN HFA) 108 (90 BASE) MCG/ACT inhaler Inhale 2 puffs into the lungs 3 (three) times daily as needed for wheezing or shortness of breath.    Yes [provider]  cyclobenzaprine (FLEXERIL) 10 MG tablet TAKE 1 TABLET (10 MG TOTAL) BY MOUTH EVERY 8 (EIGHT) HOURS AS NEEDED FOR MUSCLE SPASMS. 08/17/17  Yes Shelly Bombard, MD  furosemide (LASIX) 20 MG tablet Take 20 mg by mouth daily.   Yes [provider]  LEVEMIR FLEXTOUCH 100 UNIT/ML Pen Inject 20 Units into the skin at bedtime.  03/10/16  Yes [provider]  lisinopril (PRINIVIL,ZESTRIL) 20 MG tablet Take 20 mg by mouth daily.   Yes [provider]  metFORMIN (GLUCOPHAGE) 850 MG tablet Take 1 tablet (850 mg total) by mouth 2 (two) times daily with a meal. 05/29/12  Yes [name], NP  omeprazole (PRILOSEC) 20 MG capsule Take 1 capsule (20 mg total) by mouth 2 (two) times daily before a meal. 04/27/16  Yes Shelly Bombard, MD  SUMAtriptan (IMITREX) 100 MG tablet Take 100 mg by mouth as needed for migraine. 09/30/17  Yes [provider]  Cefixime (SUPRAX) 400 MG CAPS capsule Take 1 capsule (400 mg total) by mouth daily. Patient not taking: Reported on 06/01/2017 04/30/17   Shelly Bombard, MD  clotrimazole (LOTRIMIN) 1 % cream Apply 1 application topically 2 (two) times daily. Patient not taking: Reported on 04/29/2017 04/27/16   Shelly Bombard, MD  fluconazole (DIFLUCAN) 200 MG tablet Take 1 tablet (200 mg total) by mouth every other day. Patient not taking: Reported on 04/29/2017 04/27/16   Shelly Bombard, MD  methocarbamol (ROBAXIN) 500 MG tablet TAKE 1 TABLET (500 MG TOTAL) BY MOUTH EVERY 8 (EIGHT) HOURS AS NEEDED FOR MUSCLE SPASMS. Patient not taking: Reported  on 02/10/2018 12/04/17   Shelly Bombard, MD  nortriptyline (PAMELOR) 50 MG capsule 2 tabs po qhs Patient not taking: Reported on 06/01/2017 02/27/15   [name], MD  ondansetron (ZOFRAN ODT) 4 MG disintegrating tablet Take 1 tablet (4 mg total) by mouth every 8 (eight) hours as needed. Patient not taking: Reported on 04/29/2017 10/28/15   [name], MD  oxyCODONE (OXY IR/ROXICODONE) 5 MG immediate release tablet Take 1-2 tablets (5-10 mg total) by mouth every 6 (six) hours as needed for moderate pain or severe pain. Patient not taking: Reported on 04/29/2017 10/19/16   Georganna Skeans, MD  rizatriptan (MAXALT-MLT) 10 MG disintegrating tablet Take 1 tablet (10 mg total) by mouth as needed for migraine. May repeat in 2 hours if needed Patient not taking: Reported on 04/29/2017 10/28/15   [name], MD  sucralfate (CARAFATE) 1 GM/10ML suspension Take 10 mLs (1 g total) by mouth 4 (four) times daily -  with meals and at bedtime. Patient not taking:  Reported on 10/19/2016 05/21/16   Ward, Delice Bison, DO    Allergies:   No Known Allergies  Social History:   Social History   Socioeconomic History  . Marital status: Single    Spouse name: Not on file  . Number of children: 4  . Years of education: 63  . Highest education level: Not on file  Occupational History  . Occupation: Unemployed  Social Needs  . Financial resource strain: Not on file  . Food insecurity:    Worry: Not on file    Inability: Not on file  . Transportation needs:    Medical: Not on file    Non-medical: Not on file  Tobacco Use  . Smoking status: Never Smoker  . Smokeless tobacco: Never Used  Substance and Sexual Activity  . Alcohol use: No    Alcohol/week: 0.0 standard drinks  . Drug use: No  . Sexual activity: Not Currently    Partners: Male    Birth control/protection: Post-menopausal  Lifestyle  . Physical activity:    Days per week: Not on file    Minutes per session: Not on file  . Stress: Not on file    Relationships  . Social connections:    Talks on phone: Not on file    Gets together: Not on file    Attends religious service: Not on file    Active member of club or organization: Not on file    Attends meetings of clubs or organizations: Not on file    Relationship status: Not on file  . Intimate partner violence:    Fear of current or ex partner: Not on file    Emotionally abused: Not on file    Physically abused: Not on file    Forced sexual activity: Not on file  Other Topics Concern  . Not on file  Social History Narrative   Lives at home with her 2 sons.  Grown dtr out of the house.   Right-handed.   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   Retired Quarry manager.    Family History:   The patient's family history includes CAD (age of onset: 53) in her mother; Diabetes in her mother; Hypertension in her mother; Irregular heart beat in her mother.  ROS:  Please see the history of present illness.  All other ROS reviewed and negative.     Physical Exam/Data:   Vitals:   02/10/18 2224 02/10/18 2254 02/11/18 0612 02/11/18 0746  BP: 136/82 119/85 (!) 145/84 110/75  Pulse: 96 97 84 96  Resp: 16   20  Temp:  97.8 F (36.6 C) 98.3 F (36.8 C) 97.6 F (36.4 C)  TempSrc:  Oral Oral Oral  SpO2: 97% 100% 98% 100%  Weight:  (!) 138.9 kg (!) 138.3 kg   Height:  5\' 7"  (1.702 m)     No intake or output data in the 24 hours ending 02/11/18 0949 Filed Weights   02/10/18 1350 02/10/18 2254 02/11/18 0612  Weight: (!) 165.6 kg (!) 138.9 kg (!) 138.3 kg   Body mass index is 47.75 kg/m.  General: Well developed, well nourished morbidly obese AAF, in no acute distress. Head: Normocephalic, atraumatic, sclera non-icteric, no xanthomas, nares are without discharge.  Neck: Negative for carotid bruits. JVD not elevated. Lungs: Clear bilaterally to auscultation without wheezes, rales, or rhonchi. Breathing is unlabored. Heart: RRR with S1 S2. No murmurs, rubs, or gallops appreciated. Abdomen: Soft, non-tender,  non-distended with normoactive bowel sounds. No hepatomegaly. No rebound/guarding. No obvious  abdominal masses. Msk:  Strength and tone appear normal for age. Extremities: No clubbing or cyanosis. No edema.  Distal pedal pulses are 2+ and equal bilaterally. Neuro: Alert and oriented X 3. No facial asymmetry. No focal deficit. Moves all extremities spontaneously. Psych:  Responds to questions appropriately with a normal affect.  EKG:  The EKG was personally reviewed and demonstrates sinus tach 112bpm right atrial enlargement otherwise no acute changes  Laboratory Data:  Chemistry Recent Labs  Lab 02/10/18 1400 02/11/18 0559  NA 133* 135  K 3.4* 4.0  CL 97* 99  CO2 23 27  GLUCOSE 228* 223*  BUN 10 9  CREATININE 0.87 0.82  CALCIUM 9.4 9.3  GFRNONAA >60 >60  GFRAA >60 >60  ANIONGAP 13 9    No results for input(s): PROT, ALBUMIN, AST, ALT, ALKPHOS, BILITOT in the last 168 hours. Hematology Recent Labs  Lab 02/10/18 1400  WBC 9.4  RBC 4.69  HGB 13.6  HCT 41.6  MCV 88.7  MCH 29.0  MCHC 32.7  RDW 12.2  PLT 432*   Cardiac Enzymes Recent Labs  Lab 02/11/18 0018 02/11/18 0559  TROPONINI <0.03 <0.03    Recent Labs  Lab 02/10/18 1534  TROPIPOC 0.02    BNPNo results for input(s): BNP, PROBNP in the last 168 hours.  DDimer  Recent Labs  Lab 02/10/18 1756  DDIMER 1.43*    Radiology/Studies:  Dg Chest 2 View  Result Date: 02/10/2018 CLINICAL DATA:  Chest pain and shortness of breath EXAM: CHEST - 2 VIEW COMPARISON:  May 20, 2016 FINDINGS: The lungs are clear. The heart size and pulmonary vascularity are normal. No adenopathy. No bone lesions. No pneumothorax. IMPRESSION: No edema or consolidation. Electronically Signed   By: Lowella Grip III M.D.   On: 02/10/2018 14:31   Ct Angio Chest Pe W And/or Wo Contrast  Result Date: 02/10/2018 CLINICAL DATA:  Chest pressure starting 3 days ago. EXAM: CT ANGIOGRAPHY CHEST WITH CONTRAST TECHNIQUE: Multidetector CT imaging  of the chest was performed using the standard protocol during bolus administration of intravenous contrast. Multiplanar CT image reconstructions and MIPs were obtained to evaluate the vascular anatomy. CONTRAST:  56 mL ISOVUE-370 IOPAMIDOL (ISOVUE-370) INJECTION 76% COMPARISON:  Current chest radiographs.  Chest CT, 03/17/2015. FINDINGS: Cardiovascular: There is satisfactory pulmonary artery opacification to the proximal segmental level. There is no evidence of a pulmonary embolism. Heart is normal in size and configuration. No pericardial effusion. No coronary artery calcifications. Great vessels are normal in caliber. No aortic dissection or atherosclerosis. Mediastinum/Nodes: No neck base axillary masses or enlarged lymph nodes. There are calcified subcentimeter nodes in the superior and anterior mediastinum. No pathologically enlarged lymph nodes. Trachea and esophagus are unremarkable. Lungs/Pleura: Study appears expiratory. There are areas of ground-glass opacity most evident in the lower lobes with intervening areas of relative lucency, an overall mosaic pattern. This is most likely due to small airways disease and air trapping. No evidence of pulmonary edema. No lung mass or significant nodule. No pleural effusion or pneumothorax. Upper Abdomen: No acute abnormality. Musculoskeletal: No fracture or acute finding. No osteoblastic or osteolytic lesions. Review of the MIP images confirms the above findings. IMPRESSION: 1. No evidence of a pulmonary embolism. 2. Mosaic attenuation in the lungs, most evident in the lower lobes, which is most likely due to small airways disease and air trapping. Infection is felt unlikely. There is no evidence of pulmonary edema. Electronically Signed   By: Lajean Manes M.D.   On: 02/10/2018  20:33    Assessment and Plan:   1. Atypical chest pain, dyspnea - despite 3-4 days of chest pressure, troponin is negative and EKG is nonacute. Her morbid obesity with particularly  central focus makes noninvasive imaging such as nuc or CT much more challenging. In absence of clear cut signs of ischemia, it is not clear that her symptoms are related to cardiac pathology. She has atypical features such as improvement with GI measures and recurrent discomfort with application of echo probe. Prior cardiac testing for similar symptoms was reassuring and CT this admission does not indicate any overt coronary calcification. Consideration could be given to initiating beta blocker in case her sinus tach is contributing to exertional dizziness and dyspnea. She reports heart rate easily increases with activity. This is likely compensatory related to her severe obesity which will also need to be addressed. I will review further cardiac management with MD.  2. Morbid obesity - needs focus on OP management to address as this will continue to contribute to difficulty with diagnostics in future when necessary.  3. HTN - currently well controlled. If we add BB could consider reducing lisinopril so as not to worsen dizziness.  4. Hypokalemia/hypomagnesemia - being managed by primary team.  For questions or updates, please contact [facility] Please consult [email or web address] for contact info under Cardiology/STEMI.    Signed, Charlie Pitter, PA-C  02/11/2018 9:49 AM   History and all data above reviewed.  Patient examined.  I agree with the findings as above.  The patient reported chest pain over several days.  "I just came in to get checked out."  No objective evidence of ischemia.  Pain has atypical greater than typical features.  She reported to me that she thought it was similar to her previous reflux. The patient exam reveals COR:RRR  ,  Lungs: Clear  ,  Abd: Positive bowel sounds, no rebound no guarding, Ext No edema.  Chest:  Mild tenderness to palpation  .  All available labs, radiology testing, previous records reviewed. Agree with documented assessment and plan. CHEST PAIN:  Atypical.  No  objective evidence of ischemia.  I do not think that further in patient testing is indicated. If she has chest pain as an outpatient she can have a POET (Plain Old Exercise Treadmill)   Rolly Magri  11:11 AM  02/11/2018

## 2018-02-11 NOTE — H&P (Signed)
History and Physical    Sarah Goodman:[id number] DOB: 10-Oct-1965 DOA: 02/10/2018  PCP: Benito Mccreedy, MD Patient coming from: Home  Chief Complaint: Chest pain  HPI: Sarah Goodman is a 53 y.o. female with medical history significant of hypertension, type 2 diabetes, obesity, GERD presenting to the hospital for evaluation of chest pain.  Chest pain started at home at rest. Located in the substernal/epigastric region and described as a pressure like sensation.  It has been constant for the past 3 to 4 days.  Not associated with nausea or diaphoresis.  Her mother had an MI in her 28s.  Patient is a never smoker.  Denies having any chest pain at present.  Reports having a lot of acid reflux.  States her chest pain gets better after she takes Tums.  Reports having chronic dyspnea on exertion.   Review of Systems: As per HPI otherwise 10 point review of systems negative.  Past Medical History:  Diagnosis Date  . Chest pain    a. 05/2012 Cath: LM nl, LAD nl, LCX nl, RCA nl, EF 65%;  b. 02/2015 Echo: EF 60-65%, no rwma, Gr 1 DD, nl LA size; c. 02/2015 CTA chest: No PE.  Marland Kitchen Endometriosis   . GERD (gastroesophageal reflux disease)   . Headache(784.0)    migraines  . Hypertension   . Memory difficulty   . Migraine   . Morbid obesity ([place])   . Positive D dimer 03/2015  . Type II diabetes mellitus ([place])     Past Surgical History:  Procedure Laterality Date  . CHOLECYSTECTOMY N/A 10/19/2016   Procedure: LAPAROSCOPIC CHOLECYSTECTOMY;  Surgeon: Georganna Skeans, MD;  Location: [facility];  Service: General;  Laterality: N/A;  . COLPOSCOPY  12/18/2011   Procedure: COLPOSCOPY;  Surgeon: Lahoma Crocker, MD;  Location: [facility];  Service: Gynecology;  Laterality: N/A;  . HYSTEROSCOPY W/D&C  12/18/2011   Procedure: DILATATION AND CURETTAGE /HYSTEROSCOPY;  Surgeon: Lahoma Crocker, MD;  Location: [facility];  Service: Gynecology;  Laterality: N/A;  . LEFT HEART CATHETERIZATION WITH CORONARY  ANGIOGRAM N/A 05/26/2012   Procedure: LEFT HEART CATHETERIZATION WITH CORONARY ANGIOGRAM;  Surgeon: Sinclair Grooms, MD;  Location: [facility] CATH [facility];  Service: Cardiovascular;  Laterality: N/A;     reports that she has never smoked. She has never used smokeless tobacco. She reports that she does not drink alcohol or use drugs.  No Known Allergies  Family History  Problem Relation Age of Onset  . Diabetes Mother   . Hypertension Mother   . CAD Mother 7       Early onset CAD, with "blockage in artery and stent in heart"    Prior to Admission medications   Medication Sig Start Date End Date Taking? Authorizing Provider  albuterol (PROVENTIL HFA;VENTOLIN HFA) 108 (90 BASE) MCG/ACT inhaler Inhale 2 puffs into the lungs 3 (three) times daily as needed for wheezing or shortness of breath.    Yes [provider]  cyclobenzaprine (FLEXERIL) 10 MG tablet TAKE 1 TABLET (10 MG TOTAL) BY MOUTH EVERY 8 (EIGHT) HOURS AS NEEDED FOR MUSCLE SPASMS. 08/17/17  Yes Shelly Bombard, MD  furosemide (LASIX) 20 MG tablet Take 20 mg by mouth daily.   Yes [provider]  LEVEMIR FLEXTOUCH 100 UNIT/ML Pen Inject 20 Units into the skin at bedtime.  03/10/16  Yes [provider]  lisinopril (PRINIVIL,ZESTRIL) 20 MG tablet Take 20 mg by mouth daily.   Yes [provider]  metFORMIN (GLUCOPHAGE) 850  MG tablet Take 1 tablet (850 mg total) by mouth 2 (two) times daily with a meal. 05/29/12  Yes [name], NP  omeprazole (PRILOSEC) 20 MG capsule Take 1 capsule (20 mg total) by mouth 2 (two) times daily before a meal. 04/27/16  Yes Shelly Bombard, MD  SUMAtriptan (IMITREX) 100 MG tablet Take 100 mg by mouth as needed for migraine. 09/30/17  Yes [provider]  Cefixime (SUPRAX) 400 MG CAPS capsule Take 1 capsule (400 mg total) by mouth daily. Patient not taking: Reported on 06/01/2017 04/30/17   Shelly Bombard, MD  clotrimazole (LOTRIMIN) 1 % cream Apply 1 application  topically 2 (two) times daily. Patient not taking: Reported on 04/29/2017 04/27/16   Shelly Bombard, MD  fluconazole (DIFLUCAN) 200 MG tablet Take 1 tablet (200 mg total) by mouth every other day. Patient not taking: Reported on 04/29/2017 04/27/16   Shelly Bombard, MD  methocarbamol (ROBAXIN) 500 MG tablet TAKE 1 TABLET (500 MG TOTAL) BY MOUTH EVERY 8 (EIGHT) HOURS AS NEEDED FOR MUSCLE SPASMS. Patient not taking: Reported on 02/10/2018 12/04/17   Shelly Bombard, MD  nortriptyline (PAMELOR) 50 MG capsule 2 tabs po qhs Patient not taking: Reported on 06/01/2017 02/27/15   [name], MD  ondansetron (ZOFRAN ODT) 4 MG disintegrating tablet Take 1 tablet (4 mg total) by mouth every 8 (eight) hours as needed. Patient not taking: Reported on 04/29/2017 10/28/15   [name], MD  oxyCODONE (OXY IR/ROXICODONE) 5 MG immediate release tablet Take 1-2 tablets (5-10 mg total) by mouth every 6 (six) hours as needed for moderate pain or severe pain. Patient not taking: Reported on 04/29/2017 10/19/16   Georganna Skeans, MD  rizatriptan (MAXALT-MLT) 10 MG disintegrating tablet Take 1 tablet (10 mg total) by mouth as needed for migraine. May repeat in 2 hours if needed Patient not taking: Reported on 04/29/2017 10/28/15   [name], MD  sucralfate (CARAFATE) 1 GM/10ML suspension Take 10 mLs (1 g total) by mouth 4 (four) times daily -  with meals and at bedtime. Patient not taking: Reported on 10/19/2016 05/21/16   Ward, Delice Bison, DO    Physical Exam: Vitals:   02/10/18 2117 02/10/18 2224 02/10/18 2254 02/11/18 0612  BP: 125/83 136/82 119/85 (!) 145/84  Pulse: 96 96 97 84  Resp:  16    Temp:   97.8 F (36.6 C) 98.3 F (36.8 C)  TempSrc:   Oral Oral  SpO2: 98% 97% 100% 98%  Weight:   (!) 138.9 kg (!) 138.3 kg  Height:   5\' 7"  (1.702 m)     Physical Exam  Constitutional: She is oriented to person, place, and time. She appears well-developed and well-nourished. No distress.  HENT:  Head: Normocephalic.    Mouth/Throat: Oropharynx is clear and moist.  Eyes: Right eye exhibits no discharge. Left eye exhibits no discharge.  Neck: Neck supple.  Cardiovascular: Normal rate, regular rhythm and intact distal pulses.  Pulmonary/Chest: Effort normal and breath sounds normal. No respiratory distress. She has no wheezes. She has no rales.  Abdominal: Soft. Bowel sounds are normal. She exhibits no distension. There is no abdominal tenderness. There is no guarding.  Musculoskeletal:        General: No edema.  Neurological: She is alert and oriented to person, place, and time.  Skin: Skin is warm and dry. She is not diaphoretic.  Psychiatric: She has a normal mood and affect. Her behavior is normal.  Labs on Admission: I have personally reviewed following labs and imaging studies  CBC: Recent Labs  Lab 02/10/18 1400  WBC 9.4  HGB 13.6  HCT 41.6  MCV 88.7  PLT 518*   Basic Metabolic Panel: Recent Labs  Lab 02/10/18 1400  NA 133*  K 3.4*  CL 97*  CO2 23  GLUCOSE 228*  BUN 10  CREATININE 0.87  CALCIUM 9.4   GFR: Estimated Creatinine Clearance: 112.7 mL/min (by C-G formula based on SCr of 0.87 mg/dL). Liver Function Tests: No results for input(s): AST, ALT, ALKPHOS, BILITOT, PROT, ALBUMIN in the last 168 hours. No results for input(s): LIPASE, AMYLASE in the last 168 hours. No results for input(s): AMMONIA in the last 168 hours. Coagulation Profile: No results for input(s): INR, PROTIME in the last 168 hours. Cardiac Enzymes: Recent Labs  Lab 02/11/18 0018  TROPONINI <0.03   BNP (last 3 results) No results for input(s): PROBNP in the last 8760 hours. HbA1C: No results for input(s): HGBA1C in the last 72 hours. CBG: Recent Labs  Lab 02/10/18 2256  GLUCAP 169*   Lipid Profile: No results for input(s): CHOL, HDL, LDLCALC, TRIG, CHOLHDL, LDLDIRECT in the last 72 hours. Thyroid Function Tests: No results for input(s): TSH, T4TOTAL, FREET4, T3FREE, THYROIDAB in the last  72 hours. Anemia Panel: No results for input(s): VITAMINB12, FOLATE, FERRITIN, TIBC, IRON, RETICCTPCT in the last 72 hours. Urine analysis:    Component Value Date/Time   COLORURINE YELLOW 03/20/2015 1350   APPEARANCEUR CLOUDY (A) 03/20/2015 1350   LABSPEC 1.011 03/20/2015 1350   PHURINE 6.5 03/20/2015 1350   GLUCOSEU NEGATIVE 03/20/2015 1350   HGBUR LARGE (A) 03/20/2015 1350   BILIRUBINUR Neg 06/01/2017 1136   KETONESUR NEGATIVE 03/20/2015 1350   PROTEINUR Trace 06/01/2017 1136   PROTEINUR NEGATIVE 03/20/2015 1350   UROBILINOGEN 0.2 06/01/2017 1136   UROBILINOGEN 1 09/27/2013 1317   NITRITE Neg 06/01/2017 1136   NITRITE NEGATIVE 03/20/2015 1350   LEUKOCYTESUR Trace (A) 06/01/2017 1136    Radiological Exams on Admission: Dg Chest 2 View  Result Date: 02/10/2018 CLINICAL DATA:  Chest pain and shortness of breath EXAM: CHEST - 2 VIEW COMPARISON:  May 20, 2016 FINDINGS: The lungs are clear. The heart size and pulmonary vascularity are normal. No adenopathy. No bone lesions. No pneumothorax. IMPRESSION: No edema or consolidation. Electronically Signed   By: Lowella Grip III M.D.   On: 02/10/2018 14:31   Ct Angio Chest Pe W And/or Wo Contrast  Result Date: 02/10/2018 CLINICAL DATA:  Chest pressure starting 3 days ago. EXAM: CT ANGIOGRAPHY CHEST WITH CONTRAST TECHNIQUE: Multidetector CT imaging of the chest was performed using the standard protocol during bolus administration of intravenous contrast. Multiplanar CT image reconstructions and MIPs were obtained to evaluate the vascular anatomy. CONTRAST:  56 mL ISOVUE-370 IOPAMIDOL (ISOVUE-370) INJECTION 76% COMPARISON:  Current chest radiographs.  Chest CT, 03/17/2015. FINDINGS: Cardiovascular: There is satisfactory pulmonary artery opacification to the proximal segmental level. There is no evidence of a pulmonary embolism. Heart is normal in size and configuration. No pericardial effusion. No coronary artery calcifications. Great vessels  are normal in caliber. No aortic dissection or atherosclerosis. Mediastinum/Nodes: No neck base axillary masses or enlarged lymph nodes. There are calcified subcentimeter nodes in the superior and anterior mediastinum. No pathologically enlarged lymph nodes. Trachea and esophagus are unremarkable. Lungs/Pleura: Study appears expiratory. There are areas of ground-glass opacity most evident in the lower lobes with intervening areas of relative lucency, an overall mosaic pattern. This  is most likely due to small airways disease and air trapping. No evidence of pulmonary edema. No lung mass or significant nodule. No pleural effusion or pneumothorax. Upper Abdomen: No acute abnormality. Musculoskeletal: No fracture or acute finding. No osteoblastic or osteolytic lesions. Review of the MIP images confirms the above findings. IMPRESSION: 1. No evidence of a pulmonary embolism. 2. Mosaic attenuation in the lungs, most evident in the lower lobes, which is most likely due to small airways disease and air trapping. Infection is felt unlikely. There is no evidence of pulmonary edema. Electronically Signed   By: Lajean Manes M.D.   On: 02/10/2018 20:33    EKG: Independently reviewed.  Sinus tachycardia (heart rate 112).  Assessment/Plan Principal Problem:   Chest pain Active Problems:   Type II diabetes mellitus (HCC)   Hypokalemia   HTN (hypertension)   Atypical chest pain Risk factors for coronary artery disease include hypertension, type 2 diabetes, obesity, and family history.  Patient is currently chest pain-free and in no acute distress.  Troponin x2 negative and EKG not suggestive of ACS.  Cardiac cath done in 2014 without evidence of CAD.  CTA negative for PE. -Monitor on telemetry -Aspirin 325 mg administered -Continue to trend troponin -Echocardiogram -PPI and GI cocktail as GERD could be contributing  Mild hypokalemia Potassium 3.4. -Replete potassium -Check magnesium level -Continue to  monitor BMP  Hypertension -Continue home lisinopril  Type 2 diabetes -Check A1c -Continue home Levemir 20 units daily -Sliding scale insulin moderate -BG checks  DVT prophylaxis: Lovenox Code Status: Full code.  Discussed with the patient. Family Communication: No family available. Disposition Plan: Anticipate discharge in 1 to 2 days. Consults called: None Admission status: Observation   Shela Leff MD [facility] Pager [phone]  If 7PM-7AM, please contact night-coverage [email or web address] Password [facility]  02/11/2018, 6:57 AM

## 2018-02-11 NOTE — Progress Notes (Signed)
  Echocardiogram 2D Echocardiogram has been performed.  Sarah Goodman 02/11/2018, 10:03 AM

## 2018-02-11 NOTE — Discharge Instructions (Signed)
Chest Wall Pain °Chest wall pain is pain in or around the bones and muscles of your chest. Chest wall pain may be caused by: °· An injury. °· Coughing a lot. °· Using your chest and arm muscles too much. °Sometimes, the cause may not be known. This pain may take a few weeks or longer to get better. °Follow these instructions at home: °Managing pain, stiffness, and swelling °If told, put ice on the painful area: °· Put ice in a plastic bag. °· Place a towel between your skin and the bag. °· Leave the ice on for 20 minutes, 2-3 times a day. ° °Activity °· Rest as told by your doctor. °· Avoid doing things that cause pain. This includes lifting heavy items. °· Ask your doctor what activities are safe for you. °General instructions ° °· Take over-the-counter and prescription medicines only as told by your doctor. °· Do not use any products that contain nicotine or tobacco, such as cigarettes, e-cigarettes, and chewing tobacco. If you need help quitting, ask your doctor. °· Keep all follow-up visits as told by your doctor. This is important. °Contact a doctor if: °· You have a fever. °· Your chest pain gets worse. °· You have new symptoms. °Get help right away if: °· You feel sick to your stomach (nauseous) or you throw up (vomit). °· You feel sweaty or light-headed. °· You have a cough with mucus from your lungs (sputum) or you cough up blood. °· You are short of breath. °These symptoms may be an emergency. Do not wait to see if the symptoms will go away. Get medical help right away. Call your local emergency services (911 in the [place] Do not drive yourself to the hospital. °Summary °· Chest wall pain is pain in or around the bones and muscles of your chest. °· It may be treated with ice, rest, and medicines. Your condition may also get better if you avoid doing things that cause pain. °· Contact a doctor if you have a fever, chest pain that gets worse, or new symptoms. °· Get help right away if you feel light-headed  or you get short of breath. These symptoms may be an emergency. °This information is not intended to replace advice given to you by your health care provider. Make sure you discuss any questions you have with your health care provider. °Document Released: 07/15/2007 Document Revised: 07/29/2017 Document Reviewed: 07/29/2017 °Elsevier Interactive Patient Education © 2019 [employer] ° ° ° ° °Nonspecific Chest Pain °Chest pain can be caused by many different conditions. Some causes of chest pain can be life-threatening. These will require treatment right away. Serious causes of chest pain include: °· Heart attack. °· A tear in the body's main blood vessel. °· Redness and swelling (inflammation) around your heart. °· Blood clot in your lungs. °Other causes of chest pain may not be so serious. These include: °· Heartburn. °· Anxiety or stress. °· Damage to bones or muscles in your chest. °· Lung infections. °Chest pain can feel like: °· Pain or discomfort in your chest. °· Crushing, pressure, aching, or squeezing pain. °· Burning or tingling. °· Dull or sharp pain that is worse when you move, cough, or take a deep breath. °· Pain or discomfort that is also felt in your back, neck, jaw, shoulder, or arm, or pain that spreads to any of these areas. °It is hard to know whether your pain is caused by something that is serious or something that is not so   serious. So it is important to see your doctor right away if you have chest pain. °Follow these instructions at home: °Medicines °· Take over-the-counter and prescription medicines only as told by your doctor. °· If you were prescribed an antibiotic medicine, take it as told by your doctor. Do not stop taking the antibiotic even if you start to feel better. °Lifestyle ° °· Rest as told by your doctor. °· Do not use any products that contain nicotine or tobacco, such as cigarettes, e-cigarettes, and chewing tobacco. If you need help quitting, ask your doctor. °· Do not  drink alcohol. °· Make lifestyle changes as told by your doctor. These may include: °? Getting regular exercise. Ask your doctor what activities are safe for you. °? Eating a heart-healthy diet. A diet and nutrition specialist (dietitian) can help you to learn healthy eating options. °? Staying at a healthy weight. °? Treating diabetes or high blood pressure, if needed. °? Lowering your stress. Activities such as yoga and relaxation techniques can help. °General instructions °· Pay attention to any changes in your symptoms. Tell your doctor about them or any new symptoms. °· Avoid any activities that cause chest pain. °· Keep all follow-up visits as told by your doctor. This is important. You may need more testing if your chest pain does not go away. °Contact a doctor if: °· Your chest pain does not go away. °· You feel depressed. °· You have a fever. °Get help right away if: °· Your chest pain is worse. °· You have a cough that gets worse, or you cough up blood. °· You have very bad (severe) pain in your belly (abdomen). °· You pass out (faint). °· You have either of these for no clear reason: °? Sudden chest discomfort. °? Sudden discomfort in your arms, back, neck, or jaw. °· You have shortness of breath at any time. °· You suddenly start to sweat, or your skin gets clammy. °· You feel sick to your stomach (nauseous). °· You throw up (vomit). °· You suddenly feel lightheaded or dizzy. °· You feel very weak or tired. °· Your heart starts to beat fast, or it feels like it is skipping beats. °These symptoms may be an emergency. Do not wait to see if the symptoms will go away. Get medical help right away. Call your local emergency services (911 in the [place] Do not drive yourself to the hospital. °Summary °· Chest pain can be caused by many different conditions. The cause may be serious and need treatment right away. If you have chest pain, see your doctor right away. °· Follow your doctor's instructions for taking  medicines and making lifestyle changes. °· Keep all follow-up visits as told by your doctor. This includes visits for any further testing if your chest pain does not go away. °· Be sure to know the signs that show that your condition has become worse. Get help right away if you have these symptoms. °This information is not intended to replace advice given to you by your health care provider. Make sure you discuss any questions you have with your health care provider. °Document Released: 07/15/2007 Document Revised: 07/29/2017 Document Reviewed: 07/29/2017 °Elsevier Interactive Patient Education © 2019 [employer] ° ° ° °

## 2018-02-11 NOTE — Discharge Summary (Signed)
Discharge Summary  Sarah Goodman GYI:[id number] DOB: 04-18-1965  PCP: Benito Mccreedy, MD  Admit date: 02/10/2018 Discharge date: 02/11/2018  Time spent: 35 minutes  Recommendations for Outpatient Follow-up:  1. Follow up with your PCP 2. Follow up with your cardiologist 3. Take your medications as prescribed   Discharge Diagnoses:  Active Hospital Problems   Diagnosis Date Noted  . Chest pain 03/17/2015  . Hypokalemia 02/11/2018  . HTN (hypertension) 02/11/2018  . Type II diabetes mellitus [facility])     Resolved Hospital Problems  No resolved problems to display.    Discharge Condition: Stable  Diet recommendation: Resume previous diet  Vitals:   02/11/18 0746 02/11/18 1125  BP: 110/75 102/63  Pulse: 96 99  Resp: 20 18  Temp: 97.6 F (36.4 C) 97.9 F (36.6 C)  SpO2: 100% 94%    History of present illness:  She was remotely evaluated for chest pain dating back to at least April 2014. At that time, she underwent diagnostic catheterization revealing normal coronary arteries. In 2017 she was evaluated by the cardiology team for recurrent chest pain and had negative troponins. CT angio of the chest was negative for PE and echocardiogram was unrevealing (EF 60-65%, grade 1 DD). Chest pain was not felt consistent with cardiac etiology. Her baseline HR appears right on the border of sinus tach.  She presents back to the hospital with recurrent chest discomfort described as pressure, constant, for 3-4 days. She indicates she walks regularly but is limited by fatigue and dizziness - this specifically has not changed recently. She did notice more SOB with exertion than usual yesterday. She took her outpatient PPI and it made no relief. She took tums which did help her belch with improved the discomfort somewhat. She also indicates she felt some numbing relief of the pressure with GI cocktail.  The chest discomfort does not change with exertion. It did get worse when the echo  probe was pushed on her chest. She is chest pain free now. Troponins neg x 3, Plt 432, d-dimer 1.43, normal WBC/Hgb. CT angio showed no evidence of PE, + No evidence of a pulmonary embolism; mosaic attenuation in the lungs, most evident in the lower lobes, which is most likely due to small airways disease and air trapping. There were no coronary calcifications or aortic dissection noted. VSS. Does not smoke, drink or use drugs. Mother had stenting sometime in her 60s.  Echo tech just finished up echo and indicates that LVEF is preliminarily normal.  02/11/18: Seen and examined at her bedside. Chest pain has resolved. No dyspnea or palpitations. No new complaints. 2D echo no significant difference from prior. Troponin negative x 3. EKG no specific ST T elevations. Seen by cardiology. Signed off and ok to discharge.  On the day of discharge the patient was hemodynamically stable. Will need to follow up with her PCP post hospitalization.  Hospital Course:  Principal Problem:   Chest pain Active Problems:   Type II diabetes mellitus (HCC)   Hypokalemia   HTN (hypertension)  1. Atypical chest pain, dyspnea-  Troponin is negative and EKG is nonacute -She has atypical features such as improvement with GI measures and recurrent discomfort with application of echo probe.  -Prior cardiac testing for similar symptoms was reassuring and CT this admission does not indicate any overt coronary calcification.   2. Morbid obesity -  -Weight loss outpatient  3. HTN - currently well controlled. If we add BB could consider reducing lisinopril so as  not to worsen dizziness.  4. Hypokalemia/hypomagnesemia -Repleted   Procedures:  2D echo  Consultations:  Cardiology  Discharge Exam: BP 102/63 (BP Location: Left Arm)   Pulse 99   Temp 97.9 F (36.6 C) (Oral)   Resp 18   Ht 5\' 7"  (1.702 m)   Wt (!) 138.3 kg   LMP 03/27/2016   SpO2 94%   BMI 47.75 kg/m  . General: 53 y.o. year-old female  morbidly obese in no acute distress.  Alert and oriented x3. . Cardiovascular: Regular rate and rhythm with no rubs or gallops.  No thyromegaly or JVD noted.   Marland Kitchen Respiratory: Clear to auscultation with no wheezes or rales. Good inspiratory effort. . Abdomen: Soft nontender nondistended with normal bowel sounds x4 quadrants. . Musculoskeletal: No lower extremity edema. 2/4 pulses in all 4 extremities. Marland Kitchen Psychiatry: Mood is appropriate for condition and setting  Discharge Instructions You were cared for by a hospitalist during your hospital stay. If you have any questions about your discharge medications or the care you received while you were in the hospital after you are discharged, you can call the unit and asked to speak with the hospitalist on call if the hospitalist that took care of you is not available. Once you are discharged, your primary care physician will handle any further medical issues. Please note that NO REFILLS for any discharge medications will be authorized once you are discharged, as it is imperative that you return to your primary care physician (or establish a relationship with a primary care physician if you do not have one) for your aftercare needs so that they can reassess your need for medications and monitor your lab values.   Allergies as of 02/11/2018   No Known Allergies     Medication List    STOP taking these medications   cefixime 400 MG Caps capsule Commonly known as:  SUPRAX   clotrimazole 1 % cream Commonly known as:  LOTRIMIN   fluconazole 200 MG tablet Commonly known as:  DIFLUCAN   methocarbamol 500 MG tablet Commonly known as:  ROBAXIN   nortriptyline 50 MG capsule Commonly known as:  PAMELOR   ondansetron 4 MG disintegrating tablet Commonly known as:  ZOFRAN ODT   oxyCODONE 5 MG immediate release tablet Commonly known as:  Oxy IR/ROXICODONE   rizatriptan 10 MG disintegrating tablet Commonly known as:  MAXALT-MLT   sucralfate 1 GM/10ML  suspension Commonly known as:  CARAFATE     TAKE these medications   albuterol 108 (90 Base) MCG/ACT inhaler Commonly known as:  PROVENTIL HFA;VENTOLIN HFA Inhale 2 puffs into the lungs 3 (three) times daily as needed for wheezing or shortness of breath.   cyclobenzaprine 10 MG tablet Commonly known as:  FLEXERIL TAKE 1 TABLET (10 MG TOTAL) BY MOUTH EVERY 8 (EIGHT) HOURS AS NEEDED FOR MUSCLE SPASMS.   furosemide 20 MG tablet Commonly known as:  LASIX Take 20 mg by mouth daily.   LEVEMIR FLEXTOUCH 100 UNIT/ML Pen Generic drug:  Insulin Detemir Inject 20 Units into the skin at bedtime.   lisinopril 20 MG tablet Commonly known as:  PRINIVIL,ZESTRIL Take 20 mg by mouth daily.   metFORMIN 850 MG tablet Commonly known as:  GLUCOPHAGE Take 1 tablet (850 mg total) by mouth 2 (two) times daily with a meal.   omeprazole 20 MG capsule Commonly known as:  PRILOSEC Take 1 capsule (20 mg total) by mouth 2 (two) times daily before a meal.   SUMAtriptan 100 MG  tablet Commonly known as:  IMITREX Take 100 mg by mouth as needed for migraine.      No Known Allergies Follow-up Information    Osei-Bonsu, Iona Beard, MD. Call in 1 day(s).   Specialty:  Internal Medicine Why:  Please call for a post hospital appointment Contact information: [address] [place] [address] [phone]        Lelon Perla, MD .   Specialty:  Cardiology Contact information: [address] [place] [address] [phone]            The results of significant diagnostics from this hospitalization (including imaging, microbiology, ancillary and laboratory) are listed below for reference.    Significant Diagnostic Studies: Dg Chest 2 View  Result Date: 02/10/2018 CLINICAL DATA:  Chest pain and shortness of breath EXAM: CHEST - 2 VIEW COMPARISON:  May 20, 2016 FINDINGS: The lungs are clear. The heart size and pulmonary vascularity are normal. No adenopathy. No  bone lesions. No pneumothorax. IMPRESSION: No edema or consolidation. Electronically Signed   By: Lowella Grip III M.D.   On: 02/10/2018 14:31   Ct Angio Chest Pe W And/or Wo Contrast  Result Date: 02/10/2018 CLINICAL DATA:  Chest pressure starting 3 days ago. EXAM: CT ANGIOGRAPHY CHEST WITH CONTRAST TECHNIQUE: Multidetector CT imaging of the chest was performed using the standard protocol during bolus administration of intravenous contrast. Multiplanar CT image reconstructions and MIPs were obtained to evaluate the vascular anatomy. CONTRAST:  56 mL ISOVUE-370 IOPAMIDOL (ISOVUE-370) INJECTION 76% COMPARISON:  Current chest radiographs.  Chest CT, 03/17/2015. FINDINGS: Cardiovascular: There is satisfactory pulmonary artery opacification to the proximal segmental level. There is no evidence of a pulmonary embolism. Heart is normal in size and configuration. No pericardial effusion. No coronary artery calcifications. Great vessels are normal in caliber. No aortic dissection or atherosclerosis. Mediastinum/Nodes: No neck base axillary masses or enlarged lymph nodes. There are calcified subcentimeter nodes in the superior and anterior mediastinum. No pathologically enlarged lymph nodes. Trachea and esophagus are unremarkable. Lungs/Pleura: Study appears expiratory. There are areas of ground-glass opacity most evident in the lower lobes with intervening areas of relative lucency, an overall mosaic pattern. This is most likely due to small airways disease and air trapping. No evidence of pulmonary edema. No lung mass or significant nodule. No pleural effusion or pneumothorax. Upper Abdomen: No acute abnormality. Musculoskeletal: No fracture or acute finding. No osteoblastic or osteolytic lesions. Review of the MIP images confirms the above findings. IMPRESSION: 1. No evidence of a pulmonary embolism. 2. Mosaic attenuation in the lungs, most evident in the lower lobes, which is most likely due to small airways  disease and air trapping. Infection is felt unlikely. There is no evidence of pulmonary edema. Electronically Signed   By: Lajean Manes M.D.   On: 02/10/2018 20:33    Microbiology: No results found for this or any previous visit (from the past 240 hour(s)).   Labs: Basic Metabolic Panel: Recent Labs  Lab 02/10/18 1400 02/11/18 0559  NA 133* 135  K 3.4* 4.0  CL 97* 99  CO2 23 27  GLUCOSE 228* 223*  BUN 10 9  CREATININE 0.87 0.82  CALCIUM 9.4 9.3  MG  --  1.5*   Liver Function Tests: No results for input(s): AST, ALT, ALKPHOS, BILITOT, PROT, ALBUMIN in the last 168 hours. No results for input(s): LIPASE, AMYLASE in the last 168 hours. No results for input(s): AMMONIA in the last 168 hours. CBC: Recent  Labs  Lab 02/10/18 1400  WBC 9.4  HGB 13.6  HCT 41.6  MCV 88.7  PLT 432*   Cardiac Enzymes: Recent Labs  Lab 02/11/18 0018 02/11/18 0559 02/11/18 1147  TROPONINI <0.03 <0.03 <0.03   BNP: BNP (last 3 results) No results for input(s): BNP in the last 8760 hours.  ProBNP (last 3 results) No results for input(s): PROBNP in the last 8760 hours.  CBG: Recent Labs  Lab 02/10/18 2256 02/11/18 0746 02/11/18 1122  GLUCAP 169* 214* 176*       Signed:  Kayleen Memos, MD [facility] 02/11/2018, 2:52 PM

## 2018-02-11 NOTE — Progress Notes (Signed)
Initial Nutrition Assessment  DOCUMENTATION CODES:   Morbid obesity  INTERVENTION:   - Switched to Carb Modified/Heart Healthy diet - Encouraged pt to include protein-rich foods with all meals and snacks, and to eat those foods first if not feeling hungry.   NUTRITION DIAGNOSIS:   Altered nutrition lab value related to chronic illness as evidenced by other (comment) Hgb A1c 9.1%, glucose 223 mg/dL  GOAL:   Patient will meet greater than or equal to 90% of their needs   MONITOR:   PO intake, Labs, Weight trends  REASON FOR ASSESSMENT:   Malnutrition Screening Tool    ASSESSMENT:   53 yo female, admitted with chest pain. PMH significant for HTN, T2DM, GERD. Home med list includes Lasix, Metformin, Prilosec.  Labs: glucose 223, magnesium 1.5 (L), Hgb A1c 9.1% Meds: novolog TID with meals, Protonix ED 40 mg daily, magnesium sulfate IVPB 4 g/100 mL once  Pt reports good, normal appetite. Typically eats 3 meals daily. Described diet changes she has made: cut down on meats, sticks to chicken and fish, switched to brown/whole wheat bread, cut down on starches (eg, corn).  Per chart, weight relatively stable over past 9 months. Pt endorses intentionally losing weight. Expressed interest in bariatric surgery, wants to lose some weight and get diet on track before moving forward with firm plans.   Discussed the importance of monitoring portion sizes, eating only until satisfied, limited empty calories, including a variety of foods in diet. Pt states she makes sure to get walks in.   Pt denies any nausea or vomiting, constipation or diarrhea, or difficulty chewing or swallowing. Endorses regular BM.  Nsg put in Heart Healthy diet order. No nutritional supplements needed at this time. Pt may benefit from [place] education if still admitted when follow-up is due.  NUTRITION - FOCUSED PHYSICAL EXAM:   Most Recent Value  Orbital Region  No depletion  Upper Arm Region  No  depletion  Thoracic and Lumbar Region  No depletion  Buccal Region  No depletion  Temple Region  No depletion  Clavicle Bone Region  No depletion  Clavicle and Acromion Bone Region  No depletion  Scapular Bone Region  No depletion  Dorsal Hand  No depletion  Patellar Region  No depletion  Anterior Thigh Region  No depletion  Posterior Calf Region  No depletion  Edema (RD Assessment)  Unable to assess  Hair  Unable to assess  Eyes  Reviewed  Mouth  Reviewed  Skin  Reviewed  Nails  Reviewed     Diet Order:   Diet Order            Diet heart healthy/carb modified Room service appropriate? Yes; Fluid consistency: Thin  Diet effective now              EDUCATION NEEDS:  Education needs have been addressed  Skin:  Skin Assessment: Reviewed RN Assessment  Last BM:  PTA  Height:  Ht Readings from Last 1 Encounters:  02/10/18 5\' 7"  (1.702 m)    Weight:  Wt Readings from Last 1 Encounters:  02/11/18 (!) 138.3 kg    Ideal Body Weight:  61.4 kg  BMI:  Body mass index is 47.75 kg/m.  Estimated Nutritional Needs:   Kcal:  2038 calories daily (MSJ)  Protein:  74-92 gm daily (1.2-1.5 g/kg IBW)  Fluid:  >/= 2 L daily or per MD discretion  Althea Grimmer, MS, RDN, LDN Pager: [phone] Available Mondays and Fridays, 9am-2pm

## 2018-02-15 ENCOUNTER — Other Ambulatory Visit: Payer: Self-pay | Admitting: Obstetrics

## 2018-02-15 DIAGNOSIS — R102 Pelvic and perineal pain: Secondary | ICD-10-CM

## 2018-02-15 DIAGNOSIS — M549 Dorsalgia, unspecified: Secondary | ICD-10-CM

## 2018-02-15 NOTE — Telephone Encounter (Signed)
Please review for refill.  

## 2018-02-27 NOTE — Progress Notes (Signed)
Cardiology Office Note   Date:  02/28/2018   ID:  Sarah Goodman, DOB April 20, 1965, MRN [id number]  PCP:  Benito Mccreedy, MD  Cardiologist:  Linard Millers 2014, more recently seen in consult by Dr. Stanford Breed in 2017  Chief Complaint  Patient presents with  . Chest Pain    little  . Hospitalization Follow-up  . Palpitations     History of Present Illness: Sarah Goodman is a 53 y.o. female who presents for post hospital follow up after being seen on consultation for chest pain. She has had a history of chronic chest pain with normal cardiac cath in 2014,found to have atypical chest pain. While evaluated in the ER, she was given a GI cocktail and found some relief. CT of the chest did not reveal PE or coronary calcifications. Echo was completed and found to be normal. No further testing was planned.   She states that she is having daily episodes of racing heart rate. She states that sometimes it can occur at rest, as when she was waiting for her ride to this appointment. Also she experiences this with exertion. She walks everyday in an effort to lose weight. She usually does well but lately she feels her HR speed up "out of nowhere" while she is walking. She has to sit and rest for a while when she returns home. She denies chest pain with these events.   Past Medical History:  Diagnosis Date  . Chest pain    a. 05/2012 Cath: LM nl, LAD nl, LCX nl, RCA nl, EF 65%;  b. 02/2015 Echo: EF 60-65%, no rwma, Gr 1 DD, nl LA size; c. 02/2015 CTA chest: No PE.  Marland Kitchen Endometriosis   . GERD (gastroesophageal reflux disease)   . Hypertension   . Migraine   . Morbid obesity ([place])   . Type II diabetes mellitus ([place])     Past Surgical History:  Procedure Laterality Date  . CHOLECYSTECTOMY N/A 10/19/2016   Procedure: LAPAROSCOPIC CHOLECYSTECTOMY;  Surgeon: Georganna Skeans, MD;  Location: [facility];  Service: General;  Laterality: N/A;  . COLPOSCOPY  12/18/2011   Procedure: COLPOSCOPY;  Surgeon: Lahoma Crocker, MD;  Location: [facility];  Service: Gynecology;  Laterality: N/A;  . HYSTEROSCOPY W/D&C  12/18/2011   Procedure: DILATATION AND CURETTAGE /HYSTEROSCOPY;  Surgeon: Lahoma Crocker, MD;  Location: [facility];  Service: Gynecology;  Laterality: N/A;  . LEFT HEART CATHETERIZATION WITH CORONARY ANGIOGRAM N/A 05/26/2012   Procedure: LEFT HEART CATHETERIZATION WITH CORONARY ANGIOGRAM;  Surgeon: Sinclair Grooms, MD;  Location: [facility] CATH LAB;  Service: Cardiovascular;  Laterality: N/A;     Current Outpatient Medications  Medication Sig Dispense Refill  . albuterol (PROVENTIL HFA;VENTOLIN HFA) 108 (90 BASE) MCG/ACT inhaler Inhale 2 puffs into the lungs 3 (three) times daily as needed for wheezing or shortness of breath.     . cyclobenzaprine (FLEXERIL) 10 MG tablet TAKE 1 TABLET (10 MG TOTAL) BY MOUTH EVERY 8 (EIGHT) HOURS AS NEEDED FOR MUSCLE SPASMS. 30 tablet 1  . furosemide (LASIX) 20 MG tablet Take 20 mg by mouth daily.    Marland Kitchen LEVEMIR FLEXTOUCH 100 UNIT/ML Pen Inject 20 Units into the skin at bedtime.   0  . lisinopril (PRINIVIL,ZESTRIL) 20 MG tablet Take 20 mg by mouth daily.    . metFORMIN (GLUCOPHAGE) 850 MG tablet Take 1 tablet (850 mg total) by mouth 2 (two) times daily with a meal.    . methocarbamol (ROBAXIN) 500 MG tablet TAKE 1 TABLET (  500 MG TOTAL) BY MOUTH EVERY 8 (EIGHT) HOURS AS NEEDED FOR MUSCLE SPASMS. 30 tablet 2  . omeprazole (PRILOSEC) 20 MG capsule Take 1 capsule (20 mg total) by mouth 2 (two) times daily before a meal. 60 capsule 11  . SUMAtriptan (IMITREX) 100 MG tablet Take 100 mg by mouth as needed for migraine.     No current facility-administered medications for this visit.     Allergies:   Patient has no known allergies.    Social History:  The patient  reports that she has never smoked. She has never used smokeless tobacco. She reports that she does not drink alcohol or use drugs.   Family History:  The patient's family history includes CAD (age of onset: 80)  in her mother; Diabetes in her mother; Hypertension in her mother; Irregular heart beat in her mother.    ROS: All other systems are reviewed and negative. Unless otherwise mentioned in H&P    PHYSICAL EXAM: VS:  BP (!) 156/94   Pulse (!) 106   Ht 5\' 6"  (1.676 m)   Wt (!) 320 lb (145.2 kg)   LMP 03/27/2016   SpO2 98%   BMI 51.65 kg/m  , BMI Body mass index is 51.65 kg/m. GEN: Well nourished, well developed, in no acute distress.Morbidly obese. HEENT: normal Neck: no JVD, carotid bruits, or masses Cardiac: RRR; no murmurs, rubs, or gallops,no edema  Respiratory:  Clear to auscultation bilaterally, normal work of breathing GI: soft, nontender, nondistended, + BS MS: no deformity or atrophy Skin: warm and dry, no rash Neuro:  Strength and sensation are intact Psych: euthymic mood, full affect   EKG: Not completed.   Recent Labs: 02/10/2018: Hemoglobin 13.6; Platelets 432 03/10/18: BUN 9; Creatinine, Ser 0.82; Magnesium 1.5; Potassium 4.0; Sodium 135    Lipid Panel    Component Value Date/Time   CHOL 156 05/25/2012 1042   TRIG 261 (H) 05/25/2012 1042   HDL 27 (L) 05/25/2012 1042   CHOLHDL 5.8 05/25/2012 1042   VLDL 52 (H) 05/25/2012 1042   LDLCALC 77 05/25/2012 1042      Wt Readings from Last 3 Encounters:  02/28/18 (!) 320 lb (145.2 kg)  2018/03/10 (!) 304 lb 14.4 oz (138.3 kg)  06/18/17 (!) 306 lb 9.6 oz (139.1 kg)      Other studies Reviewed: Echocardiogram 03/10/2018  Procedure narrative: Transthoracic echocardiography. Image   quality was suboptimal. The study was technically difficult, as a   result of poor sound wave transmission and body habitus.   Intravenous contrast (Definity) was administered. - Left ventricle: The cavity size was normal. Wall thickness was   increased in a pattern of moderate LVH. Systolic function was   vigorous. The estimated ejection fraction was in the range of 65%   to 70%. Wall motion was normal; there were no regional wall    motion abnormalities. Doppler parameters are consistent with   abnormal left ventricular relaxation (grade 1 diastolic   dysfunction). No change from prior ECHO  ASSESSMENT AND PLAN:  1.  Racing HR: I will place a 48 hour Holter to ascertain frequency and morphology of her symptoms of elevated HR. This may be related to obesity or possibly PAF, with undiagnosed OSA. Will not add any medications at this time.   2. Hypertension: Slightly elevated today. She was late for her appointment. Will follow up with this. She may benefit from low dose BB to assist with HR and BP once Holter is resulted.   3.  Chest Pain: Improved on PPI.    Current medicines are reviewed at length with the patient today.    Labs/ tests ordered today include: Holter monitor 48 hours.  Phill Myron. [place] Pugh, ANP, AACC   02/28/2018 6:03 PM    [facility] [address] Office [phone] Fax [phone]

## 2018-02-28 ENCOUNTER — Ambulatory Visit: Payer: Medicaid Other | Admitting: Adult Health

## 2018-02-28 ENCOUNTER — Encounter: Payer: Self-pay | Admitting: Adult Health

## 2018-02-28 VITALS — BP 156/94 | HR 106 | Ht 66.0 in | Wt 320.0 lb

## 2018-02-28 DIAGNOSIS — R002 Palpitations: Secondary | ICD-10-CM | POA: Diagnosis not present

## 2018-02-28 DIAGNOSIS — R000 Tachycardia, unspecified: Secondary | ICD-10-CM

## 2018-02-28 DIAGNOSIS — I10 Essential (primary) hypertension: Secondary | ICD-10-CM | POA: Diagnosis not present

## 2018-02-28 NOTE — Patient Instructions (Signed)
Testing/Procedures: Your physician has recommended that you wear a 48-hour holter monitor. Holter monitors are medical devices that record the heart's electrical activity. Doctors most often use these monitors to diagnose arrhythmias. Arrhythmias are problems with the speed or rhythm of the heartbeat. The monitor is a small, portable device. You can wear one while you do your normal daily activities. This is usually used to diagnose what is causing palpitations/syncope (passing out).  Follow-Up: You will need a follow up appointment AFTER TESTING.  You may see Kirk Ruths, MD  Jory Sims, DNP, AACC  or one of the following Advanced Practice Providers on your designated Care Team:  Kerin Ransom, [place]  Roby Lofts, PA-C  Sande Rives, [place]  Medication Instructions:  NO CHANGES- Your physician recommends that you continue on your current medications as directed. Please refer to the Current Medication list given to you today. If you need a refill on your cardiac medications before your next appointment, please call your pharmacy. Labwork: When you have your labs (blood work) drawn today and your tests are completely normal, you will receive your results only by MyChart Message (if you have MyChart) -OR-  A paper copy in the mail.  If you have any lab test that is abnormal or we need to change your treatment, we will call you to review these results.  At [facility], you and your health needs are our priority.  As part of our continuing mission to provide you with exceptional heart care, we have created designated Provider Care Teams.  These Care Teams include your primary Cardiologist (physician) and Advanced Practice Providers (APPs -  Physician Assistants and Nurse Practitioners) who all work together to provide you with the care you need, when you need it.  Thank you for choosing [facility] at [facility]!!

## 2018-03-09 ENCOUNTER — Encounter (HOSPITAL_COMMUNITY): Payer: Self-pay | Admitting: Family Medicine

## 2018-03-09 ENCOUNTER — Emergency Department (HOSPITAL_COMMUNITY): Payer: Medicaid Other

## 2018-03-09 ENCOUNTER — Emergency Department (HOSPITAL_COMMUNITY)
Admission: EM | Admit: 2018-03-09 | Discharge: 2018-03-09 | Disposition: A | Discharge status: Home / Self Care | Payer: Medicaid Other | Attending: Emergency Medicine | Admitting: Emergency Medicine

## 2018-03-09 DIAGNOSIS — Z794 Long term (current) use of insulin: Secondary | ICD-10-CM | POA: Insufficient documentation

## 2018-03-09 DIAGNOSIS — R112 Nausea with vomiting, unspecified: Secondary | ICD-10-CM

## 2018-03-09 DIAGNOSIS — E1165 Type 2 diabetes mellitus with hyperglycemia: Secondary | ICD-10-CM | POA: Insufficient documentation

## 2018-03-09 DIAGNOSIS — R109 Unspecified abdominal pain: Secondary | ICD-10-CM | POA: Diagnosis present

## 2018-03-09 DIAGNOSIS — I10 Essential (primary) hypertension: Secondary | ICD-10-CM | POA: Insufficient documentation

## 2018-03-09 DIAGNOSIS — R739 Hyperglycemia, unspecified: Secondary | ICD-10-CM

## 2018-03-09 LAB — LIPASE, BLOOD: Lipase: 28 U/L (ref 11–51)

## 2018-03-09 LAB — COMPREHENSIVE METABOLIC PANEL
ALT: 25 U/L (ref 0–44)
AST: 20 U/L (ref 15–41)
Albumin: 4.3 g/dL (ref 3.5–5.0)
Alkaline Phosphatase: 56 U/L (ref 38–126)
Anion gap: 15 (ref 5–15)
BUN: 16 mg/dL (ref 6–20)
CO2: 24 mmol/L (ref 22–32)
Calcium: 9.7 mg/dL (ref 8.9–10.3)
Chloride: 93 mmol/L — ABNORMAL LOW (ref 98–111)
Creatinine, Ser: 0.75 mg/dL (ref 0.44–1.00)
GFR calc Af Amer: >60 mL/min (ref >60)
GFR calc non Af Amer: >60 mL/min (ref >60)
Glucose, Bld: 419 mg/dL — ABNORMAL HIGH (ref 70–99)
Potassium: 4.7 mmol/L (ref 3.5–5.1)
Sodium: 132 mmol/L — ABNORMAL LOW (ref 135–145)
Total Bilirubin: 1.1 mg/dL (ref 0.3–1.2)
Total Protein: 9.4 g/dL — ABNORMAL HIGH (ref 6.5–8.1)

## 2018-03-09 LAB — CBC
HCT: 42.4 % (ref 36.0–46.0)
Hemoglobin: 14.3 g/dL (ref 12.0–15.0)
MCH: 30.2 pg (ref 26.0–34.0)
MCHC: 33.7 g/dL (ref 30.0–36.0)
MCV: 89.6 fL (ref 80.0–100.0)
Platelets: 430 10*3/uL — ABNORMAL HIGH (ref 150–400)
RBC: 4.73 MIL/uL (ref 3.87–5.11)
RDW: 12.4 % (ref 11.5–15.5)
WBC: 13.6 10*3/uL — ABNORMAL HIGH (ref 4.0–10.5)
nRBC: 0 % (ref 0.0–0.2)

## 2018-03-09 LAB — URINALYSIS, ROUTINE W REFLEX MICROSCOPIC
Bilirubin Urine: NEGATIVE
Glucose, UA: >=500 mg/dL — AB
Ketones, ur: 80 mg/dL — AB
Leukocytes, UA: NEGATIVE
Nitrite: NEGATIVE
Protein, ur: 30 mg/dL — AB
Specific Gravity, Urine: 1.035 — ABNORMAL HIGH (ref 1.005–1.030)
pH: 5 (ref 5.0–8.0)

## 2018-03-09 LAB — I-STAT BETA HCG BLOOD, ED (MC, WL, AP ONLY): I-stat hCG, quantitative: <5 m[IU]/mL (ref <5)

## 2018-03-09 LAB — CBG MONITORING, ED
Glucose-Capillary: 399 mg/dL — ABNORMAL HIGH (ref 70–99)
Glucose-Capillary: 448 mg/dL — ABNORMAL HIGH (ref 70–99)
Glucose-Capillary: 338 mg/dL — ABNORMAL HIGH (ref 70–99)
Glucose-Capillary: 304 mg/dL — ABNORMAL HIGH (ref 70–99)

## 2018-03-09 MED ORDER — SODIUM CHLORIDE 0.9 % IV BOLUS
1000.0000 mL | Freq: Once | INTRAVENOUS | Status: AC
  Administered 2018-03-09: 1000 mL via INTRAVENOUS

## 2018-03-09 MED ORDER — IOPAMIDOL (ISOVUE-300) INJECTION 61%
INTRAVENOUS | Status: AC
  Filled 2018-03-09: qty 100

## 2018-03-09 MED ORDER — SODIUM CHLORIDE 0.9% FLUSH: 3.0000 mL | Freq: Once | INTRAVENOUS | Status: DC

## 2018-03-09 MED ORDER — LIDOCAINE VISCOUS HCL 2 % MT SOLN
15.0000 mL | Freq: Once | OROMUCOSAL | Status: AC
  Administered 2018-03-09: 15 mL via ORAL
  Filled 2018-03-09: qty 15

## 2018-03-09 MED ORDER — IOPAMIDOL (ISOVUE-300) INJECTION 61%
100.0000 mL | Freq: Once | INTRAVENOUS | Status: AC | PRN
  Administered 2018-03-09: 100 mL via INTRAVENOUS

## 2018-03-09 MED ORDER — ALUM & MAG HYDROXIDE-SIMETH 200-200-20 MG/5ML PO SUSP
30.0000 mL | Freq: Once | ORAL | Status: AC
  Administered 2018-03-09: 30 mL via ORAL
  Filled 2018-03-09: qty 30

## 2018-03-09 MED ORDER — ONDANSETRON HCL 4 MG PO TABS: 4.0000 mg | ORAL_TABLET | Freq: Three times a day (TID) | ORAL | 0 refills | Status: DC | PRN

## 2018-03-09 MED ORDER — SODIUM CHLORIDE (PF) 0.9 % IJ SOLN
INTRAMUSCULAR | Status: AC
  Filled 2018-03-09: qty 50

## 2018-03-09 MED ORDER — INSULIN ASPART 100 UNIT/ML ~~LOC~~ SOLN
5.0000 [IU] | Freq: Once | SUBCUTANEOUS | Status: AC
  Administered 2018-03-09: 5 [IU] via SUBCUTANEOUS
  Filled 2018-03-09: qty 1

## 2018-03-09 NOTE — ED Provider Notes (Signed)
[facility] DEPT Provider Note   CSN: [id number] Arrival date & time: 03/09/18  0256     History   Chief Complaint Chief Complaint  Patient presents with  . Abdominal Pain  . Hyperglycemia    HPI Sarah Goodman is a 53 y.o. female presenting for evaluation of n/v and hyperglycemia.   Patient states yesterday around 3 PM she started developed nausea.  She threw up a few times, and since then, has been having irritation in a straight line from her stomach to her throat.  She reports persistent vomiting, usually precipitated by p.o. intake.  She has not taken anything for her symptoms.  She denies pain in the lower part of her abdomen.  She denies fevers, chills, cough, shortness of breath, urinary symptoms, abnormal bowel movements.  Additionally, patient states sugars have been elevated between 350 and 450, normally run around 120.  She was able to take her insulin yesterday, but not able to take her metformin due to the vomiting.  She denies a history of hypoglycemia.  She reports a history of a laparoscopic abdominal surgery, not sure what for.  She denies other abdominal surgeries.  Patient reports stomach to throat pain is now discomfort, but it is not sharp, spasmy, or burning.  Additional history obtained from chart review, patient with a history of type 2 diabetes, GERD, migraine, obesity, hypertension.  Patient has had a cholecystectomy.   HPI  Past Medical History:  Diagnosis Date  . Chest pain    a. 05/2012 Cath: LM nl, LAD nl, LCX nl, RCA nl, EF 65%;  b. 02/2015 Echo: EF 60-65%, no rwma, Gr 1 DD, nl LA size; c. 02/2015 CTA chest: No PE.  Marland Kitchen Endometriosis   . GERD (gastroesophageal reflux disease)   . Hypertension   . Migraine   . Morbid obesity ([place])   . Type II diabetes mellitus [facility])     Patient Active Problem List   Diagnosis Date Noted  . Hypokalemia 02/11/2018  . HTN (hypertension) 02/11/2018  . Migraine 07/24/2015  .  Postmenopausal bleeding 04/02/2015  . Nausea   . Morbid obesity ([place])   . Type II diabetes mellitus ([place])   . Hive 03/18/2015  . Positive D dimer 03/18/2015  . Chest pressure 03/17/2015  . Chest pain 03/17/2015  . Chronic migraine 02/27/2015  . Dysplasia of cervix, low grade (CIN 1) 08/05/2012  . UTI (urinary tract infection) 05/24/2012  . Dehydration 05/24/2012  . GERD (gastroesophageal reflux disease) 05/24/2012  . Morbid obesity with body mass index of 50.0-59.9 in adult ([place]) 05/24/2012  . Vertigo 05/23/2012    Class: Acute  . Chest discomfort 05/23/2012    Class: Acute  . Diabetes mellitus type II, uncontrolled ([place]) 05/23/2012  . Abnormal uterine bleeding 12/18/2011  . Low grade squamous intraepithelial lesion (LGSIL) on Pap smear 12/18/2011    Past Surgical History:  Procedure Laterality Date  . CHOLECYSTECTOMY N/A 10/19/2016   Procedure: LAPAROSCOPIC CHOLECYSTECTOMY;  Surgeon: Georganna Skeans, MD;  Location: [facility];  Service: General;  Laterality: N/A;  . COLPOSCOPY  12/18/2011   Procedure: COLPOSCOPY;  Surgeon: Lahoma Crocker, MD;  Location: [facility];  Service: Gynecology;  Laterality: N/A;  . HYSTEROSCOPY W/D&C  12/18/2011   Procedure: DILATATION AND CURETTAGE /HYSTEROSCOPY;  Surgeon: Lahoma Crocker, MD;  Location: [facility];  Service: Gynecology;  Laterality: N/A;  . LEFT HEART CATHETERIZATION WITH CORONARY ANGIOGRAM N/A 05/26/2012   Procedure: LEFT HEART CATHETERIZATION WITH CORONARY ANGIOGRAM;  Surgeon: Lynnell Dike  Blenda Bridegroom, MD;  Location: [facility] CATH LAB;  Service: Cardiovascular;  Laterality: N/A;     OB History    Gravida  4   Para  4   Term      Preterm      AB      Living  4     SAB      TAB      Ectopic      Multiple      Live Births  4            Home Medications    Prior to Admission medications   Medication Sig Start Date End Date Taking? Authorizing Provider  albuterol (PROVENTIL HFA;VENTOLIN HFA) 108 (90 BASE) MCG/ACT inhaler Inhale  2 puffs into the lungs 3 (three) times daily as needed for wheezing or shortness of breath.    Yes [provider]  cyclobenzaprine (FLEXERIL) 10 MG tablet TAKE 1 TABLET (10 MG TOTAL) BY MOUTH EVERY 8 (EIGHT) HOURS AS NEEDED FOR MUSCLE SPASMS. 08/17/17  Yes Shelly Bombard, MD  furosemide (LASIX) 20 MG tablet Take 20 mg by mouth daily.   Yes [provider]  LEVEMIR FLEXTOUCH 100 UNIT/ML Pen Inject 20 Units into the skin at bedtime.  03/10/16  Yes [provider]  lisinopril (PRINIVIL,ZESTRIL) 20 MG tablet Take 20 mg by mouth daily.   Yes [provider]  metFORMIN (GLUCOPHAGE) 850 MG tablet Take 1 tablet (850 mg total) by mouth 2 (two) times daily with a meal. 05/29/12  Yes [name], NP  omeprazole (PRILOSEC) 20 MG capsule Take 1 capsule (20 mg total) by mouth 2 (two) times daily before a meal. 04/27/16  Yes Shelly Bombard, MD  SUMAtriptan (IMITREX) 100 MG tablet Take 100 mg by mouth as needed for migraine. 09/30/17  Yes [provider]  methocarbamol (ROBAXIN) 500 MG tablet TAKE 1 TABLET (500 MG TOTAL) BY MOUTH EVERY 8 (EIGHT) HOURS AS NEEDED FOR MUSCLE SPASMS. 02/16/18   Shelly Bombard, MD  ondansetron (ZOFRAN) 4 MG tablet Take 1 tablet (4 mg total) by mouth every 8 (eight) hours as needed. 03/09/18   Aadil Sur, PA-C    Family History Family History  Problem Relation Age of Onset  . Diabetes Mother   . Hypertension Mother   . CAD Mother 76       Blockage in artery and stent in heart  . Irregular heart beat Mother     Social History Social History   Tobacco Use  . Smoking status: Never Smoker  . Smokeless tobacco: Never Used  Substance Use Topics  . Alcohol use: No    Alcohol/week: 0.0 standard drinks  . Drug use: No     Allergies   Patient has no known allergies.   Review of Systems Review of Systems  Gastrointestinal: Positive for abdominal pain (epigastric), nausea and vomiting.  All other systems reviewed and  are negative.    Physical Exam Updated Vital Signs BP (!) 153/94 (BP Location: Right Arm)   Pulse (!) 113   Temp 98 F (36.7 C) (Oral)   Resp 15   Ht 5\' 4"  (1.626 m)   Wt (!) 145.2 kg   LMP 03/27/2016 Comment: negative beta HCG 03/09/18  SpO2 98%   BMI 54.93 kg/m   Physical Exam Vitals signs and nursing note reviewed.  Constitutional:      General: She is not in acute distress.    Appearance: She is well-developed.  Comments: Obese female resting comfortably in the bed in no acute distress  HENT:     Head: Normocephalic and atraumatic.     Mouth/Throat:     Mouth: Mucous membranes are dry.  Eyes:     Conjunctiva/sclera: Conjunctivae normal.     Pupils: Pupils are equal, round, and reactive to light.  Neck:     Musculoskeletal: Normal range of motion and neck supple.  Cardiovascular:     Rate and Rhythm: Regular rhythm. Tachycardia present.     Pulses: Normal pulses.     Comments: Mildly tachycardic around 105 Pulmonary:     Effort: Pulmonary effort is normal. No respiratory distress.     Breath sounds: Normal breath sounds. No wheezing.     Comments: Speaking in full sentences.  Clear lung sounds in all fields. Abdominal:     General: There is no distension.     Palpations: Abdomen is soft. There is no mass.     Tenderness: There is no guarding or rebound.     Comments: Mild discomfort with patient of the epigastric abdomen.  No tenderness palpation elsewhere in the abdomen.  Soft without rigidity, guarding, distention.  Negative rebound.  Negative Murphy sign.  No signs of peritonitis.  Musculoskeletal: Normal range of motion.  Skin:    General: Skin is warm and dry.     Capillary Refill: Capillary refill takes less than 2 seconds.  Neurological:     Mental Status: She is alert and oriented to person, place, and time.      ED Treatments / Results  Labs (all labs ordered are listed, but only abnormal results are displayed) Labs Reviewed  COMPREHENSIVE  METABOLIC PANEL - Abnormal; Notable for the following components:      Result Value   Sodium 132 (*)    Chloride 93 (*)    Glucose, Bld 419 (*)    Total Protein 9.4 (*)    All other components within normal limits  CBC - Abnormal; Notable for the following components:   WBC 13.6 (*)    Platelets 430 (*)    All other components within normal limits  URINALYSIS, ROUTINE W REFLEX MICROSCOPIC - Abnormal; Notable for the following components:   Specific Gravity, Urine 1.035 (*)    Glucose, UA >=500 (*)    Hgb urine dipstick SMALL (*)    Ketones, ur 80 (*)    Protein, ur 30 (*)    Bacteria, UA RARE (*)    All other components within normal limits  CBG MONITORING, ED - Abnormal; Notable for the following components:   Glucose-Capillary 399 (*)    All other components within normal limits  CBG MONITORING, ED - Abnormal; Notable for the following components:   Glucose-Capillary 448 (*)    All other components within normal limits  CBG MONITORING, ED - Abnormal; Notable for the following components:   Glucose-Capillary 338 (*)    All other components within normal limits  CBG MONITORING, ED - Abnormal; Notable for the following components:   Glucose-Capillary 304 (*)    All other components within normal limits  LIPASE, BLOOD  I-STAT BETA HCG BLOOD, ED (MC, WL, AP ONLY)  I-STAT BETA HCG BLOOD, ED ([facility], [facility], [facility] ONLY)    EKG None  Radiology Ct Abdomen Pelvis W Contrast  Result Date: 03/09/2018 CLINICAL DATA:  Abdominal pain. Elevated white blood count. Microhematuria. Nausea and vomiting. EXAM: CT ABDOMEN AND PELVIS WITH CONTRAST TECHNIQUE: Multidetector CT imaging of the abdomen and pelvis  was performed using the standard protocol following bolus administration of intravenous contrast. CONTRAST:  176mL ISOVUE-300 IOPAMIDOL (ISOVUE-300) INJECTION 61% COMPARISON:  CT scan dated 03/20/2016 FINDINGS: Lower chest: There is slight thickening of the distal esophageal mucosa just above the  gastroesophageal junction, nonspecific. Heart size is normal. Lung bases are clear. Hepatobiliary: No focal liver abnormality is seen. Status post cholecystectomy. No biliary dilatation. Pancreas: Unremarkable. No pancreatic ductal dilatation or surrounding inflammatory changes. Spleen: Normal in size without focal abnormality. Adrenals/Urinary Tract: Adrenal glands are unremarkable. Kidneys are normal, without renal calculi, focal lesion, or hydronephrosis. Bladder is unremarkable. Stomach/Bowel: Stomach is within normal limits. Appendix appears normal. No evidence of bowel wall thickening, distention, or inflammatory changes. Vascular/Lymphatic: No significant vascular findings are present. No enlarged abdominal or pelvic lymph nodes. Reproductive: Uterus and bilateral adnexa are unremarkable. Other: Tiny periumbilical hernia containing only fat. Musculoskeletal: No acute abnormality. Moderately severe facet arthritis in the lower lumbar spine. IMPRESSION: Slight thickening of the distal esophageal mucosa just above the gastroesophageal junction, nonspecific. Benign-appearing abdomen and pelvis. Electronically Signed   By: Lorriane Shire M.D.   On: 03/09/2018 08:30    Procedures Procedures (including critical care time)  Medications Ordered in ED Medications  sodium chloride flush (NS) 0.9 % injection 3 mL (3 mLs Intravenous Not Given 03/09/18 0607)  iopamidol (ISOVUE-300) 61 % injection (has no administration in time range)  sodium chloride (PF) 0.9 % injection (has no administration in time range)  sodium chloride 0.9 % bolus 1,000 mL (0 mLs Intravenous Stopped 03/09/18 0715)  alum & mag hydroxide-simeth (MAALOX/MYLANTA) 200-200-20 MG/5ML suspension 30 mL (30 mLs Oral Given 03/09/18 0700)    And  lidocaine (XYLOCAINE) 2 % viscous mouth solution 15 mL (15 mLs Oral Given 03/09/18 0700)  insulin aspart (novoLOG) injection 5 Units (5 Units Subcutaneous Given 03/09/18 0702)  iopamidol (ISOVUE-300) 61 %  injection 100 mL (100 mLs Intravenous Contrast Given 03/09/18 0749)     Initial Impression / Assessment and Plan / ED Course  I have reviewed the triage vital signs and the nursing notes.  Pertinent labs & imaging results that were available during my care of the patient were reviewed by me and considered in my medical decision making (see chart for details).     Pt presenting for evaluation of nausea, vomiting, and epigastric abdominal pain.  Physical exam reassuring, she is afebrile and appears nontoxic.  However, her blood sugars have been elevated, and patient with persistent tachycardia.  Considering history of diabetes, higher concern for possible infection, however viral illness more likely at this time. Will order CT for further evaluation.  Pt with mild leukocytosis of 13.6. Consider gastroenteritis, patient without history of.  Labs indicative of pancreatitis.  Patient has already had a cholecystectomy.  Blood sugar elevated at 419, but no DKA.  Bicarb and gap normal, although patient does have 80 ketones in her urine.  No sign of UTI.  Fluids in progress, will give dose of subcu insulin and continue to monitor blood sugar. Viscous lidocaine/maalox for sx control.   On reassessment, patient report symptoms are much improved.  She is no longer having pain.  CT abdomen pelvis reviewed, shows distal esophageal mucus wall thickening, nonspecific.  Blood sugars improving with fluids and insulin.  Discussed findings with patient.  She is tolerating p.o. without difficulty.  Encouraged follow-up with GI, patient states she has an appointment on Friday with her PCP for a GI referral.  Additionally, patient remains tachycardic, per chart review she  was recently seen by cardiology for persistent tachycardia.  Patient states she has an appointment to get a monitor placed tomorrow to further assess her tachycardia.  As such, low suspicion that tachycardia is related to today's symptoms.  At this time,  patient appears safe for discharge.  Return precautions given.  Patient states she understands and agrees to plan.  Final Clinical Impressions(s) / ED Diagnoses   Final diagnoses:  Non-intractable vomiting with nausea, unspecified vomiting type    ED Discharge Orders         Ordered    ondansetron (ZOFRAN) 4 MG tablet  Every 8 hours PRN     03/09/18 1036           Jermany Rimel, PA-C 03/09/18 1301    Ward, Delice Bison, DO 03/09/18 2312

## 2018-03-09 NOTE — Discharge Instructions (Addendum)
Your CT scan today showed distal esophageal mucosal wall edema without signs of infection.  Otherwise, is reassuring. Use Zofran as needed for nausea or vomiting. Continue taking your home medication as prescribed, including your omeprazole. Use Maalox as needed for discomfort. Follow-up with your primary care doctor at your scheduled appointment for GI referral.

## 2018-03-09 NOTE — ED Triage Notes (Signed)
Patient is from home and transported via [facility]. Patient is complaining of nausea, vomiting, mid abd pain, dizziness with standing/excertion. EMS obtained a blood sugar of 409. She reports not taking her insulin since yesterday. Denies fever.

## 2018-03-14 ENCOUNTER — Ambulatory Visit (INDEPENDENT_AMBULATORY_CARE_PROVIDER_SITE_OTHER): Payer: Medicaid Other

## 2018-03-14 DIAGNOSIS — R000 Tachycardia, unspecified: Secondary | ICD-10-CM | POA: Diagnosis not present

## 2018-03-14 DIAGNOSIS — R002 Palpitations: Secondary | ICD-10-CM | POA: Diagnosis not present

## 2018-03-24 ENCOUNTER — Encounter: Payer: Self-pay | Admitting: *Deleted

## 2018-03-26 NOTE — Progress Notes (Signed)
Cardiology Office Note   Date:  03/28/2018   ID:  Sarah Goodman, DOB 01-14-1966, MRN [id number]  PCP:  Benito Mccreedy, MD  Cardiologist:  Dr. Stanford Breed  Chief Complaint  Patient presents with  . Follow-up     History of Present Illness: Sarah Goodman is a 53 y.o. female who presents for ongoing assessment and management of chronic chest pain with normal cardiac cath in 2014, and diagnosed with atypical chest pain.Other history includes HTN, Type II diabetes, GERD, and morbid obesity. Echo on 02/11/2018 was revealed EF of 70% and Grade I diastolic dysfunction.  .   On last office visit, she was complaining of heart racing that occurred daily. She was trying to stay active by walking everyday. She felt racing HR during this time. A 48 hour Holter monitor was placed for evaluation.  Holter report read by Dr. Stanford Breed did not demonstrate any significant arrhythmias, with rare PVC and PAC's   She was seen in the ED on 03/09/2018 for abdominal pain and hyperglycemia. BG was 419. She was treated with insulin and and IV fluids.  She comes today feeling much better. She is walk about a mile and a half a day each day, as she visit's her daughter who lives nearby. She denies chest pain or significant dyspnea. She is monitoring her BG and BP daily.   Past Medical History:  Diagnosis Date  . Chest pain    a. 05/2012 Cath: LM nl, LAD nl, LCX nl, RCA nl, EF 65%;  b. 02/2015 Echo: EF 60-65%, no rwma, Gr 1 DD, nl LA size; c. 02/2015 CTA chest: No PE.  Marland Kitchen Endometriosis   . GERD (gastroesophageal reflux disease)   . Hypertension   . Migraine   . Morbid obesity ([place])   . Type II diabetes mellitus ([place])     Past Surgical History:  Procedure Laterality Date  . CHOLECYSTECTOMY N/A 10/19/2016   Procedure: LAPAROSCOPIC CHOLECYSTECTOMY;  Surgeon: Georganna Skeans, MD;  Location: [facility];  Service: General;  Laterality: N/A;  . COLPOSCOPY  12/18/2011   Procedure: COLPOSCOPY;  Surgeon: Lahoma Crocker,  MD;  Location: [facility];  Service: Gynecology;  Laterality: N/A;  . HYSTEROSCOPY W/D&C  12/18/2011   Procedure: DILATATION AND CURETTAGE /HYSTEROSCOPY;  Surgeon: Lahoma Crocker, MD;  Location: [facility];  Service: Gynecology;  Laterality: N/A;  . LEFT HEART CATHETERIZATION WITH CORONARY ANGIOGRAM N/A 05/26/2012   Procedure: LEFT HEART CATHETERIZATION WITH CORONARY ANGIOGRAM;  Surgeon: Sinclair Grooms, MD;  Location: [facility] C[facility] LAB;  Service: Cardiovascular;  Laterality: N/A;     Current Outpatient Medications  Medication Sig Dispense Refill  . albuterol (PROVENTIL HFA;VENTOLIN HFA) 108 (90 BASE) MCG/ACT inhaler Inhale 2 puffs into the lungs 3 (three) times daily as needed for wheezing or shortness of breath.     . cyclobenzaprine (FLEXERIL) 10 MG tablet TAKE 1 TABLET (10 MG TOTAL) BY MOUTH EVERY 8 (EIGHT) HOURS AS NEEDED FOR MUSCLE SPASMS. 30 tablet 1  . furosemide (LASIX) 20 MG tablet Take 20 mg by mouth daily.    Marland Kitchen LEVEMIR FLEXTOUCH 100 UNIT/ML Pen Inject 20 Units into the skin at bedtime.   0  . lisinopril (PRINIVIL,ZESTRIL) 20 MG tablet Take 20 mg by mouth daily.    . metFORMIN (GLUCOPHAGE) 850 MG tablet Take 1 tablet (850 mg total) by mouth 2 (two) times daily with a meal.    . methocarbamol (ROBAXIN) 500 MG tablet TAKE 1 TABLET (500 MG TOTAL) BY MOUTH EVERY 8 (EIGHT) HOURS  AS NEEDED FOR MUSCLE SPASMS. 30 tablet 2  . metoprolol tartrate (LOPRESSOR) 25 MG tablet Take 1 tablet (25 mg total) by mouth 2 (two) times daily. 30 tablet 12  . ondansetron (ZOFRAN) 4 MG tablet Take 1 tablet (4 mg total) by mouth every 8 (eight) hours as needed. 12 tablet 0  . pantoprazole (PROTONIX) 40 MG tablet Take 40 mg by mouth daily.    . SUMAtriptan (IMITREX) 100 MG tablet Take 100 mg by mouth as needed for migraine.     No current facility-administered medications for this visit.     Allergies:   Patient has no known allergies.    Social History:  The patient  reports that she has never smoked. She has never  used smokeless tobacco. She reports that she does not drink alcohol or use drugs.   Family History:  The patient's family history includes CAD (age of onset: 31) in her mother; Diabetes in her mother; Hypertension in her mother; Irregular heart beat in her mother.    ROS: All other systems are reviewed and negative. Unless otherwise mentioned in H&P    PHYSICAL EXAM: VS:  BP (!) 142/86   Pulse 84   Wt (!) 315 lb (142.9 kg)   LMP 03/27/2016 Comment: negative beta HCG 03/09/18  SpO2 99%   BMI 54.07 kg/m  , BMI Body mass index is 54.07 kg/m. GEN: Well nourished, well developed, in no acute distress.Morbidly obese.  HEENT: normal Neck: no JVD, carotid bruits, or masses Cardiac: RRR; no murmurs, rubs, or gallops,no edema  Respiratory:  Clear to auscultation bilaterally, normal work of breathing GI: soft, nontender, nondistended, + BS MS: no deformity or atrophy Skin: warm and dry, no rash Neuro:  Strength and sensation are intact Psych: euthymic mood, full affect   EKG:  Not completed this office visit.  Recent Labs: 22-Feb-2018: Magnesium 1.5 03/09/2018: ALT 25; BUN 16; Creatinine, Ser 0.75; Hemoglobin 14.3; Platelets 430; Potassium 4.7; Sodium 132    Lipid Panel    Component Value Date/Time   CHOL 156 05/25/2012 1042   TRIG 261 (H) 05/25/2012 1042   HDL 27 (L) 05/25/2012 1042   CHOLHDL 5.8 05/25/2012 1042   VLDL 52 (H) 05/25/2012 1042   LDLCALC 77 05/25/2012 1042      Wt Readings from Last 3 Encounters:  03/28/18 (!) 315 lb (142.9 kg)  03/09/18 (!) 320 lb (145.2 kg)  02/28/18 (!) 320 lb (145.2 kg)      Other studies Reviewed: Echocardiogram 02-22-18  Procedure narrative: Transthoracic echocardiography. Image   quality was suboptimal. The study was technically difficult, as a   result of poor sound wave transmission and body habitus.   Intravenous contrast (Definity) was administered. - Left ventricle: The cavity size was normal. Wall thickness was   increased in a  pattern of moderate LVH. Systolic function was   vigorous. The estimated ejection fraction was in the range of 65%   to 70%. Wall motion was normal; there were no regional wall   motion abnormalities. Doppler parameters are consistent with   abnormal left ventricular relaxation (grade 1 diastolic   dysfunction). No change from prior ECHO.  ASSESSMENT AND PLAN:  1. Hypertension: BP is moderately controlled. She was started on metoprolol while in the ED a week ago. She will be given refills on this, continue lisinopril.  Creatinine 0.75. K+ 4.7.   2. Chronic Chest Pain: She denies any new complaints. She is exercising more without new symptoms. I have encouraged her  to continue this healthy lifestyle change.   3. Morbid Obesity: She is changing her eating habits. She is following up with her PCP for ongoing in management.    Current medicines are reviewed at length with the patient today.    Labs/ tests ordered today include:None  Phill Myron. West Pugh, ANP, [facility]   03/28/2018 10:36 AM    [facility] [place] [address] Office [phone] Fax [phone]

## 2018-03-28 ENCOUNTER — Other Ambulatory Visit: Payer: Self-pay

## 2018-03-28 ENCOUNTER — Ambulatory Visit: Payer: Medicaid Other | Admitting: Adult Health

## 2018-03-28 ENCOUNTER — Encounter: Payer: Self-pay | Admitting: Adult Health

## 2018-03-28 VITALS — BP 142/86 | HR 84 | Wt 315.0 lb

## 2018-03-28 DIAGNOSIS — Z794 Long term (current) use of insulin: Secondary | ICD-10-CM

## 2018-03-28 DIAGNOSIS — E1169 Type 2 diabetes mellitus with other specified complication: Secondary | ICD-10-CM

## 2018-03-28 DIAGNOSIS — R079 Chest pain, unspecified: Secondary | ICD-10-CM

## 2018-03-28 DIAGNOSIS — I10 Essential (primary) hypertension: Secondary | ICD-10-CM | POA: Diagnosis not present

## 2018-03-28 MED ORDER — METOPROLOL TARTRATE 25 MG PO TABS: 25.0000 mg | ORAL_TABLET | Freq: Two times a day (BID) | ORAL | 12 refills | Status: DC

## 2018-03-28 NOTE — Patient Instructions (Addendum)
Follow-Up: You will need a follow up appointment in 3-4 months.  WITH Kirk Ruths, MD Jory Sims, DNP, AACC  or one of the following Advanced Practice Providers on your designated Care Team:  Kerin Ransom, [place]  Roby Lofts, PA-C  Sande Rives, [place]  Medication Instructions:  NO CHANGES- Your physician recommends that you continue on your current medications as directed. Please refer to the Current Medication list given to you today. If you need a refill on your cardiac medications before your next appointment, please call your pharmacy. Labwork: When you have labs (blood work) and your tests are completely normal, you will receive your results ONLY by [place] (if you have MyChart) -OR- A paper copy in the mail.  At [facility], you and your health needs are our priority.  As part of our continuing mission to provide you with exceptional heart care, we have created designated Provider Care Teams.  These Care Teams include your primary Cardiologist (physician) and Advanced Practice Providers (APPs -  Physician Assistants and Nurse Practitioners) who all work together to provide you with the care you need, when you need it.  Thank you for choosing [facility] at [facility]!!

## 2018-06-16 ENCOUNTER — Other Ambulatory Visit: Payer: Self-pay | Admitting: Obstetrics

## 2018-06-16 DIAGNOSIS — R102 Pelvic and perineal pain: Secondary | ICD-10-CM

## 2018-06-16 DIAGNOSIS — M549 Dorsalgia, unspecified: Secondary | ICD-10-CM

## 2018-07-22 ENCOUNTER — Telehealth: Payer: Self-pay

## 2018-07-22 NOTE — Telephone Encounter (Signed)
Left a detailed message for the patient about her upcoming appointment and switching it to a virtual video or telephone visit for the same date and time and to give our office a call and someone will assist her with switching her appointment

## 2018-07-26 ENCOUNTER — Telehealth: Payer: Self-pay | Admitting: Cardiology

## 2018-07-26 NOTE — Telephone Encounter (Signed)
.  LVM to offer Virtual, no email & my chart code exp

## 2018-07-27 NOTE — Telephone Encounter (Signed)
Called the patient and left another detailed message for her about her upcoming appointment and switching it to a virtual visit for the same date and time.   Called the patient's daughter Jazline Cumbee who is on the patient's DPR could not leave a message for a recording stated that the subscriber is unavailable.  Will try calling patient again.

## 2018-07-27 NOTE — Telephone Encounter (Signed)
Called x3 to offer virtual

## 2018-07-28 NOTE — Telephone Encounter (Signed)
After multiple attempts to reach patient a voice message was left stating that her appointment was canceled due to not hearing back from her after many attempts and voice messages left for her concerning this appointment that the appointment would be canceled and she can give our office a call to get scheduled for another appointment.

## 2018-07-29 ENCOUNTER — Ambulatory Visit: Payer: Medicaid Other | Admitting: Cardiology

## 2018-09-23 ENCOUNTER — Other Ambulatory Visit: Payer: Self-pay | Admitting: Obstetrics

## 2018-09-23 DIAGNOSIS — M549 Dorsalgia, unspecified: Secondary | ICD-10-CM

## 2018-09-23 DIAGNOSIS — R102 Pelvic and perineal pain: Secondary | ICD-10-CM

## 2018-10-18 ENCOUNTER — Ambulatory Visit: Payer: Medicaid Other | Admitting: Obstetrics and Gynecology

## 2018-12-02 NOTE — Progress Notes (Signed)
HPI: Follow-up palpitations and chest pain.  Patient had a normal cardiac catheterization in 2014.  Echocardiogram January 2020 showed vigorous LV function, moderate left ventricular hypertrophy and grade 1 diastolic dysfunction.  Holter monitor February 2020 showed sinus with rare nonconducted PAC and rare PVC.  Since last seen she has some dyspnea on exertion.  She has pain in her chest that is chronic.  It can occur in the left shoulder, substernal or left lateral chest wall.  Occurs with both exertion and at rest.  She has had intermittent chest pain by her report for years.  She has occasional palpitations.  No syncope.  Current Outpatient Medications  Medication Sig Dispense Refill  . albuterol (PROVENTIL HFA;VENTOLIN HFA) 108 (90 BASE) MCG/ACT inhaler Inhale 2 puffs into the lungs 3 (three) times daily as needed for wheezing or shortness of breath.     . cyclobenzaprine (FLEXERIL) 10 MG tablet TAKE 1 TABLET (10 MG TOTAL) BY MOUTH EVERY 8 (EIGHT) HOURS AS NEEDED FOR MUSCLE SPASMS. 30 tablet 1  . furosemide (LASIX) 20 MG tablet Take 20 mg by mouth daily.    Marland Kitchen LEVEMIR FLEXTOUCH 100 UNIT/ML Pen Inject 20 Units into the skin at bedtime.   0  . lisinopril (PRINIVIL,ZESTRIL) 20 MG tablet Take 20 mg by mouth daily.    . pantoprazole (PROTONIX) 40 MG tablet Take 40 mg by mouth daily.     No current facility-administered medications for this visit.      Past Medical History:  Diagnosis Date  . Chest pain    a. 05/2012 Cath: LM nl, LAD nl, LCX nl, RCA nl, EF 65%;  b. 02/2015 Echo: EF 60-65%, no rwma, Gr 1 DD, nl LA size; c. 02/2015 CTA chest: No PE.  Marland Kitchen Endometriosis   . GERD (gastroesophageal reflux disease)   . Hypertension   . Migraine   . Morbid obesity ([place])   . Type II diabetes mellitus ([place])     Past Surgical History:  Procedure Laterality Date  . CHOLECYSTECTOMY N/A 10/19/2016   Procedure: LAPAROSCOPIC CHOLECYSTECTOMY;  Surgeon: Georganna Skeans, MD;  Location: [facility];  Service:  General;  Laterality: N/A;  . COLPOSCOPY  12/18/2011   Procedure: COLPOSCOPY;  Surgeon: Lahoma Crocker, MD;  Location: [facility];  Service: Gynecology;  Laterality: N/A;  . HYSTEROSCOPY W/D&C  12/18/2011   Procedure: DILATATION AND CURETTAGE /HYSTEROSCOPY;  Surgeon: Lahoma Crocker, MD;  Location: [facility];  Service: Gynecology;  Laterality: N/A;  . LEFT HEART CATHETERIZATION WITH CORONARY ANGIOGRAM N/A 05/26/2012   Procedure: LEFT HEART CATHETERIZATION WITH CORONARY ANGIOGRAM;  Surgeon: Sinclair Grooms, MD;  Location: [facility] CATH LAB;  Service: Cardiovascular;  Laterality: N/A;    Social History   Socioeconomic History  . Marital status: Single    Spouse name: Not on file  . Number of children: 4  . Years of education: 90  . Highest education level: Not on file  Occupational History  . Occupation: Unemployed  Social Needs  . Financial resource strain: Not on file  . Food insecurity    Worry: Not on file    Inability: Not on file  . Transportation needs    Medical: Not on file    Non-medical: Not on file  Tobacco Use  . Smoking status: Never Smoker  . Smokeless tobacco: Never Used  Substance and Sexual Activity  . Alcohol use: No    Alcohol/week: 0.0 standard drinks  . Drug use: No  . Sexual activity: Not Currently  Partners: Male    Birth control/protection: Post-menopausal  Lifestyle  . Physical activity    Days per week: Not on file    Minutes per session: Not on file  . Stress: Not on file  Relationships  . Social Herbalist on phone: Not on file    Gets together: Not on file    Attends religious service: Not on file    Active member of club or organization: Not on file    Attends meetings of clubs or organizations: Not on file    Relationship status: Not on file  . Intimate partner violence    Fear of current or ex partner: Not on file    Emotionally abused: Not on file    Physically abused: Not on file    Forced sexual activity: Not on file  Other  Topics Concern  . Not on file  Social History Narrative   Lives at home with her 2 sons.  Grown dtr out of the house.   Right-handed.   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   Retired Quarry manager.    Family History  Problem Relation Age of Onset  . Diabetes Mother   . Hypertension Mother   . CAD Mother 34       Blockage in artery and stent in heart  . Irregular heart beat Mother     ROS: no fevers or chills, productive cough, hemoptysis, dysphasia, odynophagia, melena, hematochezia, dysuria, hematuria, rash, seizure activity, orthopnea, PND, pedal edema, claudication. Remaining systems are negative.  Physical Exam: Well-developed morbidly obese in no acute distress.  Skin is warm and dry.  HEENT is normal.  Neck is supple.  Chest is clear to auscultation with normal expansion.  Cardiovascular exam is regular rate and rhythm.  Abdominal exam nontender or distended. No masses palpated. Extremities show no edema. neuro grossly intact  ECG-normal sinus rhythm at a rate of 92, no ST changes.  Personally reviewed  A/P  1 palpitations-no significant arrhythmias noted on previous monitor.  No further therapy at this time.  2 chest pain-patient with history of atypical chest pain.  Previous cardiac catheterization showed no coronary disease.  Electrocardiogram with no ST changes.  We will not pursue further ischemia evaluation.  3 hypertension-blood pressure is controlled.  Continue present medications and follow.  4 morbid obesity-we discussed the importance of diet, weight loss and exercise.  Kirk Ruths, MD

## 2018-12-06 ENCOUNTER — Ambulatory Visit (INDEPENDENT_AMBULATORY_CARE_PROVIDER_SITE_OTHER): Payer: Medicaid Other | Admitting: Cardiology

## 2018-12-06 ENCOUNTER — Encounter: Payer: Self-pay | Admitting: Cardiology

## 2018-12-06 ENCOUNTER — Other Ambulatory Visit: Payer: Self-pay

## 2018-12-06 VITALS — BP 110/84 | HR 92 | Temp 96.9°F | Ht 67.0 in | Wt 304.0 lb

## 2018-12-06 DIAGNOSIS — R079 Chest pain, unspecified: Secondary | ICD-10-CM | POA: Diagnosis not present

## 2018-12-06 DIAGNOSIS — I10 Essential (primary) hypertension: Secondary | ICD-10-CM

## 2018-12-06 NOTE — Patient Instructions (Signed)
Medication Instructions:  NO CHANGE *If you need a refill on your cardiac medications before your next appointment, please call your pharmacy*  Lab Work: If you have labs (blood work) drawn today and your tests are completely normal, you will receive your results only by: . MyChart Message (if you have MyChart) OR . A paper copy in the mail If you have any lab test that is abnormal or we need to change your treatment, we will call you to review the results.  Follow-Up: At [facility], you and your health needs are our priority.  As part of our continuing mission to provide you with exceptional heart care, we have created designated Provider Care Teams.  These Care Teams include your primary Cardiologist (physician) and Advanced Practice Providers (APPs -  Physician Assistants and Nurse Practitioners) who all work together to provide you with the care you need, when you need it.  Your next appointment:   12 month(s)  The format for your next appointment:   Either In Person or Virtual  Provider:   You may see Brian Crenshaw, MD or one of the following Advanced Practice Providers on your designated Care Team:    Luke Kilroy, PA-C  Callie Goodrich, PA-C  Jesse Cleaver, FNP    

## 2019-01-09 ENCOUNTER — Ambulatory Visit: Payer: Self-pay | Admitting: Allergy

## 2019-01-09 NOTE — Progress Notes (Deleted)
New Patient Note  RE: Sarah Goodman MRN: [id number] DOB: 14-Jul-1965 Date of Office Visit: 01/09/2019  Referring provider: Benito Mccreedy, MD Primary care provider: Benito Mccreedy, MD  Chief Complaint: No chief complaint on file.  History of Present Illness: I had the pleasure of seeing Sarah Goodman for initial evaluation at the [facility] on 01/09/2019. She is a 53 y.o. female, who is referred here by Benito Mccreedy, MD for the evaluation of recurrent hives.  Rash started about *** ago. Mainly occurs on her ***. Describes them as ***. Individual rashes lasts about ***. No ecchymosis upon resolution. Associated symptoms include: ***. Suspected triggers are ***. Denies any *** fevers, chills, changes in medications, foods, personal care products or recent infections. She has tried the following therapies: *** with *** benefit. Systemic steroids ***. Currently on ***.  Previous work up includes: ***. Previous history of rash/hives: ***. Patient is up to date with the following cancer screening tests: ***.  Assessment and Plan: Marnell is a 53 y.o. female with: No problem-specific Assessment & Plan notes found for this encounter.  No follow-ups on file.  No orders of the defined types were placed in this encounter.  Lab Orders  No laboratory test(s) ordered today    Other allergy screening: Asthma: {Blank single:19197::"yes","no"} Rhino conjunctivitis: {Blank single:19197::"yes","no"} Food allergy: {Blank single:19197::"yes","no"} Medication allergy: {Blank single:19197::"yes","no"} Hymenoptera allergy: {Blank single:19197::"yes","no"} Urticaria: {Blank single:19197::"yes","no"} Eczema:{Blank single:19197::"yes","no"} History of recurrent infections suggestive of immunodeficency: {Blank single:19197::"yes","no"}  Diagnostics: Spirometry:  Tracings reviewed. Her effort: {Blank single:19197::"Good reproducible efforts.","It was hard to  get consistent efforts and there is a question as to whether this reflects a maximal maneuver.","Poor effort, data can not be interpreted."} FVC: ***L FEV1: ***L, ***% predicted FEV1/FVC ratio: ***% Interpretation: {Blank single:19197::"Spirometry consistent with mild obstructive disease","Spirometry consistent with moderate obstructive disease","Spirometry consistent with severe obstructive disease","Spirometry consistent with possible restrictive disease","Spirometry consistent with mixed obstructive and restrictive disease","Spirometry uninterpretable due to technique","Spirometry consistent with normal pattern","No overt abnormalities noted given today's efforts"}.  Please see scanned spirometry results for details.  Skin Testing: {Blank single:19197::"Select foods","Environmental allergy panel","Environmental allergy panel and select foods","Food allergy panel","None","Deferred due to recent antihistamines use"}. Positive test to: ***. Negative test to: ***.  Results discussed with patient/family.   Past Medical History: Patient Active Problem List   Diagnosis Date Noted  . Hypokalemia 02/11/2018  . HTN (hypertension) 02/11/2018  . Migraine 07/24/2015  . Postmenopausal bleeding 04/02/2015  . Nausea   . Morbid obesity ([place])   . Type II diabetes mellitus ([place])   . Hive 03/18/2015  . Positive D dimer 03/18/2015  . Chest pressure 03/17/2015  . Chest pain 03/17/2015  . Chronic migraine 02/27/2015  . Dysplasia of cervix, low grade (CIN 1) 08/05/2012  . UTI (urinary tract infection) 05/24/2012  . Dehydration 05/24/2012  . GERD (gastroesophageal reflux disease) 05/24/2012  . Morbid obesity with body mass index of 50.0-59.9 in adult ([place]) 05/24/2012  . Vertigo 05/23/2012    Class: Acute  . Chest discomfort 05/23/2012    Class: Acute  . Diabetes mellitus type II, uncontrolled ([place]) 05/23/2012  . Abnormal uterine bleeding 12/18/2011  . Low grade squamous intraepithelial lesion (LGSIL)  on Pap smear 12/18/2011   Past Medical History:  Diagnosis Date  . Chest pain    a. 05/2012 Cath: LM nl, LAD nl, LCX nl, RCA nl, EF 65%;  b. 02/2015 Echo: EF 60-65%, no rwma, Gr 1 DD, nl LA size; c. 02/2015 CTA chest: No PE.  Marland Kitchen  Endometriosis   . GERD (gastroesophageal reflux disease)   . Hypertension   . Migraine   . Morbid obesity ([place])   . Type II diabetes mellitus ([place])    Past Surgical History: Past Surgical History:  Procedure Laterality Date  . CHOLECYSTECTOMY N/A 10/19/2016   Procedure: LAPAROSCOPIC CHOLECYSTECTOMY;  Surgeon: Georganna Skeans, MD;  Location: [facility];  Service: General;  Laterality: N/A;  . COLPOSCOPY  12/18/2011   Procedure: COLPOSCOPY;  Surgeon: Lahoma Crocker, MD;  Location: [facility];  Service: Gynecology;  Laterality: N/A;  . HYSTEROSCOPY W/D&C  12/18/2011   Procedure: DILATATION AND CURETTAGE /HYSTEROSCOPY;  Surgeon: Lahoma Crocker, MD;  Location: [facility];  Service: Gynecology;  Laterality: N/A;  . LEFT HEART CATHETERIZATION WITH CORONARY ANGIOGRAM N/A 05/26/2012   Procedure: LEFT HEART CATHETERIZATION WITH CORONARY ANGIOGRAM;  Surgeon: Sinclair Grooms, MD;  [facility];  Service: Cardiovascular;  Laterality: N/A;   Medication List:  Current Outpatient Medications  Medication Sig Dispense Refill  . albuterol (PROVENTIL HFA;VENTOLIN HFA) 108 (90 BASE) MCG/ACT inhaler Inhale 2 puffs into the lungs 3 (three) times daily as needed for wheezing or shortness of breath.     . cyclobenzaprine (FLEXERIL) 10 MG tablet TAKE 1 TABLET (10 MG TOTAL) BY MOUTH EVERY 8 (EIGHT) HOURS AS NEEDED FOR MUSCLE SPASMS. 30 tablet 1  . furosemide (LASIX) 20 MG tablet Take 20 mg by mouth daily.    Marland Kitchen LEVEMIR FLEXTOUCH 100 UNIT/ML Pen Inject 20 Units into the skin at bedtime.   0  . lisinopril (PRINIVIL,ZESTRIL) 20 MG tablet Take 20 mg by mouth daily.    . pantoprazole (PROTONIX) 40 MG tablet Take 40 mg by mouth daily.     No current facility-administered medications for this  visit.    Allergies: No Known Allergies Social History: Social History   Socioeconomic History  . Marital status: Single    Spouse name: Not on file  . Number of children: 4  . Years of education: 13  . Highest education level: Not on file  Occupational History  . Occupation: Unemployed  Social Needs  . Financial resource strain: Not on file  . Food insecurity    Worry: Not on file    Inability: Not on file  . Transportation needs    Medical: Not on file    Non-medical: Not on file  Tobacco Use  . Smoking status: Never Smoker  . Smokeless tobacco: Never Used  Substance and Sexual Activity  . Alcohol use: No    Alcohol/week: 0.0 standard drinks  . Drug use: No  . Sexual activity: Not Currently    Partners: Male    Birth control/protection: Post-menopausal  Lifestyle  . Physical activity    Days per week: Not on file    Minutes per session: Not on file  . Stress: Not on file  Relationships  . Social Herbalist on phone: Not on file    Gets together: Not on file    Attends religious service: Not on file    Active member of club or organization: Not on file    Attends meetings of clubs or organizations: Not on file    Relationship status: Not on file  Other Topics Concern  . Not on file  Social History Narrative   Lives at home with her 2 sons.  Grown dtr out of the house.   Right-handed.   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   Retired Quarry manager.   Lives in a ***. Smoking: ***  Occupation: ***  Environmental HistoryFreight forwarder in the house: Estate agent in the family room: {Blank single:19197::"yes","no"} Carpet in the bedroom: {Blank single:19197::"yes","no"} Heating: {Blank single:19197::"electric","gas"} Cooling: {Blank single:19197::"central","window"} Pet: {Blank single:19197::"yes ***","no"}  Family History: Family History  Problem Relation Age of Onset  . Diabetes Mother   . Hypertension Mother   . CAD Mother 27        Blockage in artery and stent in heart  . Irregular heart beat Mother    Problem                               Relation Asthma                                   *** Eczema                                *** Food allergy                          *** Allergic rhino conjunctivitis     ***  Review of Systems  Constitutional: Negative for appetite change, chills, fever and unexpected weight change.  HENT: Negative for congestion and rhinorrhea.   Eyes: Negative for itching.  Respiratory: Negative for cough, chest tightness, shortness of breath and wheezing.   Cardiovascular: Negative for chest pain.  Gastrointestinal: Negative for abdominal pain.  Genitourinary: Negative for difficulty urinating.  Skin: Negative for rash.  Allergic/Immunologic: Negative for environmental allergies and food allergies.  Neurological: Negative for headaches.   Objective: LMP 03/27/2016 Comment: negative beta HCG 03/09/18 There is no height or weight on file to calculate BMI. Physical Exam  Constitutional: She is oriented to person, place, and time. She appears well-developed and well-nourished.  HENT:  Head: Normocephalic and atraumatic.  Right Ear: External ear normal.  Left Ear: External ear normal.  Nose: Nose normal.  Mouth/Throat: Oropharynx is clear and moist.  Eyes: Conjunctivae and EOM are normal.  Neck: Neck supple.  Cardiovascular: Normal rate, regular rhythm and normal heart sounds. Exam reveals no gallop and no friction rub.  No murmur heard. Pulmonary/Chest: Effort normal and breath sounds normal. She has no wheezes. She has no rales.  Abdominal: Soft.  Neurological: She is alert and oriented to person, place, and time.  Skin: Skin is warm. No rash noted.  Psychiatric: She has a normal mood and affect. Her behavior is normal.  Nursing note and vitals reviewed.  The plan was reviewed with the patient/family, and all questions/concerned were addressed.  It was my pleasure to see Sarah Goodman  today and participate in her care. Please feel free to contact me with any questions or concerns.  Sincerely,  Rexene Alberts, DO Allergy & Immunology  [facility] office: [phone] [facility] office: [place] office: [phone]

## 2019-02-01 ENCOUNTER — Ambulatory Visit: Payer: Medicaid Other | Admitting: Obstetrics

## 2019-02-24 ENCOUNTER — Other Ambulatory Visit: Payer: Self-pay | Admitting: Physician Assistant

## 2019-02-24 DIAGNOSIS — R1310 Dysphagia, unspecified: Secondary | ICD-10-CM

## 2019-06-28 ENCOUNTER — Ambulatory Visit: Payer: Medicaid Other | Admitting: Obstetrics

## 2019-07-21 ENCOUNTER — Other Ambulatory Visit (HOSPITAL_COMMUNITY)
Admission: RE | Admit: 2019-07-21 | Discharge: 2019-07-21 | Disposition: A | Discharge status: Home / Self Care | Payer: Medicaid Other | Source: Ambulatory Visit | Attending: Obstetrics | Admitting: Obstetrics

## 2019-07-21 ENCOUNTER — Encounter: Payer: Self-pay | Admitting: Obstetrics

## 2019-07-21 ENCOUNTER — Ambulatory Visit: Payer: Medicaid Other | Admitting: Obstetrics

## 2019-07-21 ENCOUNTER — Other Ambulatory Visit: Payer: Self-pay

## 2019-07-21 VITALS — BP 131/86 | HR 87 | Wt 307.0 lb

## 2019-07-21 DIAGNOSIS — Z01419 Encounter for gynecological examination (general) (routine) without abnormal findings: Secondary | ICD-10-CM | POA: Insufficient documentation

## 2019-07-21 DIAGNOSIS — E139 Other specified diabetes mellitus without complications: Secondary | ICD-10-CM

## 2019-07-21 DIAGNOSIS — N898 Other specified noninflammatory disorders of vagina: Secondary | ICD-10-CM | POA: Insufficient documentation

## 2019-07-21 DIAGNOSIS — Z6841 Body Mass Index (BMI) 40.0 and over, adult: Secondary | ICD-10-CM

## 2019-07-21 DIAGNOSIS — B369 Superficial mycosis, unspecified: Secondary | ICD-10-CM

## 2019-07-21 DIAGNOSIS — Z0000 Encounter for general adult medical examination without abnormal findings: Secondary | ICD-10-CM

## 2019-07-21 MED ORDER — CLOTRIMAZOLE 1 % EX CREA: 1.0000 "application " | TOPICAL_CREAM | Freq: Two times a day (BID) | CUTANEOUS | 2 refills | Status: DC

## 2019-07-21 MED ORDER — TERCONAZOLE 0.4 % VA CREA: 1.0000 | TOPICAL_CREAM | Freq: Every day | VAGINAL | 2 refills | Status: DC

## 2019-07-21 NOTE — Progress Notes (Signed)
Subjective:        Sarah Goodman is a 54 y.o. female here for a routine exam.  Current complaints: Rash from yeast infection.    Personal health questionnaire:  Is patient [name], have a family history of breast and/or ovarian cancer: no Is there a family history of uterine cancer diagnosed at age < 15, gastrointestinal cancer, urinary tract cancer, family member who is a Field seismologist syndrome-associated carrier: no Is the patient overweight and hypertensive, family history of diabetes, personal history of gestational diabetes, preeclampsia or PCOS: yes Is patient over 41, have PCOS,  family history of premature CHD under age 37, diabetes, smoke, have hypertension or peripheral artery disease:  yes At any time, has a partner hit, kicked or otherwise hurt or frightened you?: no Over the past 2 weeks, have you felt down, depressed or hopeless?: no Over the past 2 weeks, have you felt little interest or pleasure in doing things?:no   Gynecologic History Patient's last menstrual period was 03/27/2016. Contraception: post menopausal status Last Pap: 2019. Results were: normal Last mammogram: 2021. Results were: normal  Obstetric History OB History  Gravida Para Term Preterm AB Living  4 4       4   SAB TAB Ectopic Multiple Live Births          4    # Outcome Date GA Lbr Len/2nd Weight Sex Delivery Anes PTL Lv  4 Para         LIV  3 Para         LIV  2 Para         LIV  1 Para         LIV    Past Medical History:  Diagnosis Date  . Chest pain    a. 05/2012 Cath: LM nl, LAD nl, LCX nl, RCA nl, EF 65%;  b. 02/2015 Echo: EF 60-65%, no rwma, Gr 1 DD, nl LA size; c. 02/2015 CTA chest: No PE.  Marland Kitchen Endometriosis   . GERD (gastroesophageal reflux disease)   . Hypertension   . Migraine   . Morbid obesity ([place])   . Type II diabetes mellitus ([place])     Past Surgical History:  Procedure Laterality Date  . CHOLECYSTECTOMY N/A 10/19/2016   Procedure: LAPAROSCOPIC CHOLECYSTECTOMY;   Surgeon: Georganna Skeans, MD;  Location: [facility];  Service: General;  Laterality: N/A;  . COLPOSCOPY  12/18/2011   Procedure: COLPOSCOPY;  Surgeon: Lahoma Crocker, MD;  Location: [facility];  Service: Gynecology;  Laterality: N/A;  . HYSTEROSCOPY WITH D & C  12/18/2011   Procedure: DILATATION AND CURETTAGE /HYSTEROSCOPY;  Surgeon: Lahoma Crocker, MD;  Location: [facility];  Service: Gynecology;  Laterality: N/A;  . LEFT HEART CATHETERIZATION WITH CORONARY ANGIOGRAM N/A 05/26/2012   Procedure: LEFT HEART CATHETERIZATION WITH CORONARY ANGIOGRAM;  Surgeon: Sinclair Grooms, MD;  [facility] C[facility] LAB;  Service: Cardiovascular;  Laterality: N/A;     Current Outpatient Medications:  .  albuterol (PROVENTIL HFA;VENTOLIN HFA) 108 (90 BASE) MCG/ACT inhaler, Inhale 2 puffs into the lungs 3 (three) times daily as needed for wheezing or shortness of breath. , Disp: , Rfl:  .  clotrimazole (LOTRIMIN) 1 % cream, Apply 1 application topically 2 (two) times daily., Disp: 113 g, Rfl: 2 .  cyclobenzaprine (FLEXERIL) 10 MG tablet, TAKE 1 TABLET (10 MG TOTAL) BY MOUTH EVERY 8 (EIGHT) HOURS AS NEEDED FOR MUSCLE SPASMS., Disp: 30 tablet, Rfl: 1 .  furosemide (LASIX) 20 MG tablet,  Take 20 mg by mouth daily., Disp: , Rfl:  .  LEVEMIR FLEXTOUCH 100 UNIT/ML Pen, Inject 20 Units into the skin at bedtime. , Disp: , Rfl: 0 .  lisinopril (PRINIVIL,ZESTRIL) 20 MG tablet, Take 20 mg by mouth daily., Disp: , Rfl:  .  pantoprazole (PROTONIX) 40 MG tablet, Take 40 mg by mouth daily., Disp: , Rfl:  .  terconazole (TERAZOL 7) 0.4 % vaginal cream, Place 1 applicator vaginally at bedtime., Disp: 45 g, Rfl: 2 No Known Allergies  Social History   Tobacco Use  . Smoking status: Never Smoker  . Smokeless tobacco: Never Used  Substance Use Topics  . Alcohol use: No    Alcohol/week: 0.0 standard drinks    Family History  Problem Relation Age of Onset  . Diabetes Mother   . Hypertension Mother   . CAD Mother 4       Blockage in  artery and stent in heart  . Irregular heart beat Mother       Review of Systems  Constitutional: negative for fatigue and weight loss Respiratory: negative for cough and wheezing Cardiovascular: negative for chest pain, fatigue and palpitations Gastrointestinal: negative for abdominal pain and change in bowel habits Musculoskeletal:negative for myalgias Neurological: negative for gait problems and tremors Behavioral/Psych: negative for abusive relationship, depression Endocrine: negative for temperature intolerance    Genitourinary:positive for genital yeast infection Integument/breast: negative for breast lump, breast tenderness, nipple discharge and skin lesion(s)    Objective:       BP 131/86   Pulse 87   Wt (!) 307 lb (139.3 kg)   LMP 03/27/2016 Comment: negative beta HCG 03/09/18  BMI 48.08 kg/m  General:   alert  Skin:   no rash or abnormalities  Lungs:   clear to auscultation bilaterally  Heart:   regular rate and rhythm, S1, S2 normal, no murmur, click, rub or gallop  Breasts:   normal without suspicious masses, skin or nipple changes or axillary nodes  Abdomen:  normal findings: no organomegaly, soft, non-tender and no hernia  Pelvis:  External genitalia: normal general appearance Urinary system: urethral meatus normal and bladder without fullness, nontender Vaginal: normal without tenderness, induration or masses Cervix: normal appearance Adnexa: normal bimanual exam Uterus: anteverted and non-tender, normal size   Lab Review Urine pregnancy test Labs reviewed yes Radiologic studies reviewed yes  50% of 20 min visit spent on counseling and coordination of care.   Assessment:     1. Encounter for routine gynecological examination with Papanicolaou smear of cervix Rx: - Cytology - PAP( [place])  2. Vaginal discharge Rx: - terconazole (TERAZOL 7) 0.4 % vaginal cream; Place 1 applicator vaginally at bedtime.  Dispense: 45 g; Refill: 2 -  Cervicovaginal ancillary only  3. Superficial fungal infection of skin Rx: - clotrimazole (LOTRIMIN) 1 % cream; Apply 1 application topically 2 (two) times daily.  Dispense: 113 g; Refill: 2  4. 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 Class 3 severe obesity due to excess calories without serious comorbidity with body mass index (BMI) of 45.0 to 49.9 in adult [facility]) - program of caloric reduction, exercise and behavioral modification recommended  Plan:    Education reviewed: calcium supplements, depression evaluation, low fat, low cholesterol diet, safe sex/STD prevention, self breast exams and weight bearing exercise. Follow up in: 1 year.   Shelly Bombard, MD 07/21/2019 10:33 AM

## 2019-07-24 ENCOUNTER — Other Ambulatory Visit: Payer: Self-pay | Admitting: Obstetrics

## 2019-07-24 DIAGNOSIS — B9689 Other specified bacterial agents as the cause of diseases classified elsewhere: Secondary | ICD-10-CM

## 2019-07-24 DIAGNOSIS — N760 Acute vaginitis: Secondary | ICD-10-CM

## 2019-07-24 LAB — CERVICOVAGINAL ANCILLARY ONLY
Bacterial Vaginitis (gardnerella): POSITIVE — AB
Candida Glabrata: NEGATIVE
Candida Vaginitis: POSITIVE — AB
Chlamydia: NEGATIVE
Comment: NEGATIVE
Comment: NEGATIVE
Comment: NEGATIVE
Comment: NEGATIVE
Comment: NEGATIVE
Comment: NORMAL
Neisseria Gonorrhea: NEGATIVE
Trichomonas: NEGATIVE

## 2019-07-24 LAB — CYTOLOGY - PAP
Comment: NEGATIVE
Diagnosis: NEGATIVE
High risk HPV: NEGATIVE

## 2019-07-24 MED ORDER — TINIDAZOLE 500 MG PO TABS: 1000.0000 mg | ORAL_TABLET | Freq: Every day | ORAL | 2 refills | Status: DC

## 2019-07-25 ENCOUNTER — Encounter: Payer: Self-pay | Admitting: *Deleted

## 2019-08-30 ENCOUNTER — Other Ambulatory Visit: Payer: Medicaid Other

## 2020-04-02 ENCOUNTER — Other Ambulatory Visit: Payer: Self-pay

## 2020-04-02 ENCOUNTER — Encounter: Payer: Self-pay | Admitting: Cardiology

## 2020-04-02 ENCOUNTER — Ambulatory Visit (INDEPENDENT_AMBULATORY_CARE_PROVIDER_SITE_OTHER): Payer: Medicaid Other | Admitting: Cardiology

## 2020-04-02 VITALS — BP 138/78 | HR 80 | Wt 307.8 lb

## 2020-04-02 DIAGNOSIS — I10 Essential (primary) hypertension: Secondary | ICD-10-CM

## 2020-04-02 DIAGNOSIS — R0683 Snoring: Secondary | ICD-10-CM | POA: Diagnosis not present

## 2020-04-02 DIAGNOSIS — R079 Chest pain, unspecified: Secondary | ICD-10-CM | POA: Diagnosis not present

## 2020-04-02 DIAGNOSIS — R072 Precordial pain: Secondary | ICD-10-CM

## 2020-04-02 DIAGNOSIS — R002 Palpitations: Secondary | ICD-10-CM

## 2020-04-02 MED ORDER — METOPROLOL TARTRATE 100 MG PO TABS: ORAL_TABLET | ORAL | 0 refills | Status: DC

## 2020-04-02 NOTE — Patient Instructions (Signed)
  Testing/Procedures:  Your cardiac CT will be scheduled at   [facility] [place], [place] [address] [phone]    If scheduled at [facility], please arrive at the [facility] main entrance (entrance A) of [facility] 30 minutes prior to test start time. Proceed to the [facility] Radiology Department (first floor) to check-in and test prep.   Please follow these instructions carefully (unless otherwise directed):  On the Night Before the Test: . Be sure to Drink plenty of water. . Do not consume any caffeinated/decaffeinated beverages or chocolate 12 hours prior to your test. . Do not take any antihistamines 12 hours prior to your test.   On the Day of the Test: . Drink plenty of water until 1 hour prior to the test. . Do not eat any food 4 hours prior to the test. . You may take your regular medications prior to the test.  . Take metoprolol (Lopressor) 100 MG two hours prior to test. . HOLD Furosemide/Hydrochlorothiazide morning of the test. . FEMALES- please wear underwire-free bra if available  After the Test: . Drink plenty of water. . After receiving IV contrast, you may experience a mild flushed feeling. This is normal. . On occasion, you may experience a mild rash up to 24 hours after the test. This is not dangerous. If this occurs, you can take Benadryl 25 mg and increase your fluid intake. . If you experience trouble breathing, this can be serious. If it is severe call 911 IMMEDIATELY. If it is mild, please call our office. . If you take any of these medications: Glipizide/Metformin, Avandament, Glucavance, please do not take 48 hours after completing test unless otherwise instructed.   Once we have confirmed authorization from your insurance company, we will call you to set up a date and time for your test. Based on how quickly your insurance processes prior authorizations requests, please allow up to 4 weeks to be  contacted for scheduling your Cardiac CT appointment. Be advised that routine Cardiac CT appointments could be scheduled as many as 8 weeks after your provider has ordered it.  For non-scheduling related questions, please contact the cardiac imaging nurse navigator should you have any questions/concerns: Marchia Bond, Cardiac Imaging Nurse Navigator Gordy Clement, Cardiac Imaging Nurse Navigator [facility] Direct Office Dial: [phone]   For scheduling needs, including cancellations and rescheduling, please call [place], [phone].     Follow-Up: At [facility], you and your health needs are our priority.  As part of our continuing mission to provide you with exceptional heart care, we have created designated Provider Care Teams.  These Care Teams include your primary Cardiologist (physician) and Advanced Practice Providers (APPs -  Physician Assistants and Nurse Practitioners) who all work together to provide you with the care you need, when you need it.  We recommend signing up for the patient portal called "MyChart".  Sign up information is provided on this After Visit Summary.  MyChart is used to connect with patients for Virtual Visits (Telemedicine).  Patients are able to view lab/test results, encounter notes, upcoming appointments, etc.  Non-urgent messages can be sent to your provider as well.   To learn more about what you can do with MyChart, go to [email or web address].    Your next appointment:   12 month(s)  The format for your next appointment:   In Person  Provider:   Kirk Ruths, MD

## 2020-04-02 NOTE — Progress Notes (Signed)
HPI: Follow-up palpitations and chest pain.  Patient had a normal cardiac catheterization in 2014.  Echocardiogram January 2020 showed vigorous LV function, moderate left ventricular hypertrophy and grade 1 diastolic dysfunction.  Holter monitor February 2020 showed sinus with rare nonconducted PAC and rare PVC.  Since last seen she notes some dyspnea on exertion.  She also complains of chest heaviness that is continuous without radiation.  Not pleuritic.  Occasional palpitations.  Current Outpatient Medications  Medication Sig Dispense Refill  . albuterol (PROVENTIL HFA;VENTOLIN HFA) 108 (90 BASE) MCG/ACT inhaler Inhale 2 puffs into the lungs 3 (three) times daily as needed for wheezing or shortness of breath.    . clotrimazole (LOTRIMIN) 1 % cream Apply 1 application topically 2 (two) times daily. 113 g 2  . cyclobenzaprine (FLEXERIL) 10 MG tablet TAKE 1 TABLET (10 MG TOTAL) BY MOUTH EVERY 8 (EIGHT) HOURS AS NEEDED FOR MUSCLE SPASMS. 30 tablet 1  . furosemide (LASIX) 20 MG tablet Take 20 mg by mouth daily.    Marland Kitchen LEVEMIR FLEXTOUCH 100 UNIT/ML Pen Inject 20 Units into the skin at bedtime.   0  . lisinopril (PRINIVIL,ZESTRIL) 20 MG tablet Take 20 mg by mouth daily.    . pantoprazole (PROTONIX) 40 MG tablet Take 40 mg by mouth daily.    Marland Kitchen terconazole (TERAZOL 7) 0.4 % vaginal cream Place 1 applicator vaginally at bedtime. 45 g 2  . tinidazole (TINDAMAX) 500 MG tablet Take 2 tablets (1,000 mg total) by mouth daily with breakfast. 10 tablet 2   No current facility-administered medications for this visit.     Past Medical History:  Diagnosis Date  . Chest pain    a. 05/2012 Cath: LM nl, LAD nl, LCX nl, RCA nl, EF 65%;  b. 02/2015 Echo: EF 60-65%, no rwma, Gr 1 DD, nl LA size; c. 02/2015 CTA chest: No PE.  Marland Kitchen Endometriosis   . GERD (gastroesophageal reflux disease)   . Hypertension   . Migraine   . Morbid obesity ([place])   . Type II diabetes mellitus ([place])     Past Surgical History:   Procedure Laterality Date  . CHOLECYSTECTOMY N/A 10/19/2016   Procedure: LAPAROSCOPIC CHOLECYSTECTOMY;  Surgeon: Georganna Skeans, MD;  Location: [facility];  Service: General;  Laterality: N/A;  . COLPOSCOPY  12/18/2011   Procedure: COLPOSCOPY;  Surgeon: Lahoma Crocker, MD;  Location: [facility];  Service: Gynecology;  Laterality: N/A;  . HYSTEROSCOPY WITH D & C  12/18/2011   Procedure: DILATATION AND CURETTAGE /HYSTEROSCOPY;  Surgeon: Lahoma Crocker, MD;  Location: [facility];  Service: Gynecology;  Laterality: N/A;  . LEFT HEART CATHETERIZATION WITH CORONARY ANGIOGRAM N/A 05/26/2012   Procedure: LEFT HEART CATHETERIZATION WITH CORONARY ANGIOGRAM;  Surgeon: Sinclair Grooms, MD;  Location: [facility] CATH LAB;  Service: Cardiovascular;  Laterality: N/A;    Social History   Socioeconomic History  . Marital status: Single    Spouse name: Not on file  . Number of children: 4  . Years of education: 32  . Highest education level: Not on file  Occupational History  . Occupation: Unemployed  Tobacco Use  . Smoking status: Never Smoker  . Smokeless tobacco: Never Used  Vaping Use  . Vaping Use: Never used  Substance and Sexual Activity  . Alcohol use: No    Alcohol/week: 0.0 standard drinks  . Drug use: No  . Sexual activity: Not Currently    Partners: Male    Birth control/protection: Post-menopausal  Other Topics  Concern  . Not on file  Social History Narrative   Lives at home with her 2 sons.  Grown dtr out of the house.   Right-handed.   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   Retired Quarry manager.   Social Determinants of Health   Financial Resource Strain: Not on file  Food Insecurity: Not on file  Transportation Needs: Not on file  Physical Activity: Not on file  Stress: Not on file  Social Connections: Not on file  Intimate Partner Violence: Not on file    Family History  Problem Relation Age of Onset  . Diabetes Mother   . Hypertension Mother   . CAD Mother 49       Blockage in artery and stent in  heart  . Irregular heart beat Mother     ROS: no fevers or chills, productive cough, hemoptysis, dysphasia, odynophagia, melena, hematochezia, dysuria, hematuria, rash, seizure activity, orthopnea, PND, pedal edema, claudication. Remaining systems are negative.  Physical Exam: Well-developed obese in no acute distress.  Skin is warm and dry.  HEENT is normal.  Neck is supple.  Chest is clear to auscultation with normal expansion.  Cardiovascular exam is regular rate and rhythm.  Abdominal exam nontender or distended. No masses palpated. Extremities show no edema. neuro grossly intact  ECG-normal sinus rhythm at a rate of 80, no ST changes.  Personally reviewed  A/P  1 chest pain-previous catheterization revealed no coronary disease.  Electrocardiogram shows no new ST changes.  Symptoms are atypical but multiple risk factors.  We will arrange cardiac CTA to rule out obstructive coronary disease.  2 palpitations-symptoms have improved compared to previous.  3 hypertension-blood pressure controlled.  Continue present medications.  4 morbid obesity-we discussed importance of weight loss.  I will also arrange evaluation with pulmonary to rule out sleep apnea.   Kirk Ruths, MD

## 2020-04-08 ENCOUNTER — Telehealth: Payer: Self-pay | Admitting: Cardiology

## 2020-04-08 ENCOUNTER — Institutional Professional Consult (permissible substitution): Payer: Medicaid Other | Admitting: Internal Medicine

## 2020-04-08 NOTE — Telephone Encounter (Signed)
Spoke with patient regarding Pulmonary consult scheduled Wednesday 05/15/20 at 10:30 am with Dr. Phill Mutter [facility] [address] [place] [phone].  Will mail information to patient and she voiced her understanding.

## 2020-04-15 ENCOUNTER — Telehealth (HOSPITAL_COMMUNITY): Payer: Self-pay | Admitting: *Deleted

## 2020-04-15 NOTE — Telephone Encounter (Signed)
Reaching out to patient to offer assistance regarding upcoming cardiac imaging study; pt verbalizes understanding of appt date/time, parking situation and where to check in, pre-test NPO status and medications ordered, and verified current allergies; name and call back number provided for further questions should they arise  Garvey Westcott RN Navigator Cardiac Imaging [facility] [phone] office [phone] cell  

## 2020-04-16 ENCOUNTER — Ambulatory Visit (HOSPITAL_COMMUNITY)
Admission: RE | Admit: 2020-04-16 | Discharge: 2020-04-16 | Disposition: A | Discharge status: Home / Self Care | Payer: Medicaid Other | Source: Ambulatory Visit | Attending: Cardiology | Admitting: Cardiology

## 2020-04-16 ENCOUNTER — Other Ambulatory Visit: Payer: Self-pay

## 2020-04-16 ENCOUNTER — Encounter (HOSPITAL_COMMUNITY): Payer: Self-pay

## 2020-04-16 DIAGNOSIS — R072 Precordial pain: Secondary | ICD-10-CM

## 2020-04-16 DIAGNOSIS — Z006 Encounter for examination for normal comparison and control in clinical research program: Secondary | ICD-10-CM

## 2020-04-16 MED ORDER — NITROGLYCERIN 0.4 MG SL SUBL
0.8000 mg | SUBLINGUAL_TABLET | Freq: Once | SUBLINGUAL | Status: AC
  Administered 2020-04-16: 0.8 mg via SUBLINGUAL

## 2020-04-16 MED ORDER — NITROGLYCERIN 0.4 MG SL SUBL
SUBLINGUAL_TABLET | SUBLINGUAL | Status: AC
  Filled 2020-04-16: qty 2

## 2020-04-16 MED ORDER — METOPROLOL TARTRATE 5 MG/5ML IV SOLN
INTRAVENOUS | Status: AC
  Administered 2020-04-16: 5 mg via INTRAVENOUS
  Filled 2020-04-16: qty 20

## 2020-04-16 MED ORDER — IOHEXOL 350 MG/ML SOLN
100.0000 mL | Freq: Once | INTRAVENOUS | Status: AC | PRN
  Administered 2020-04-16: 100 mL via INTRAVENOUS

## 2020-04-16 MED ORDER — METOPROLOL TARTRATE 5 MG/5ML IV SOLN
5.0000 mg | INTRAVENOUS | Status: DC | PRN
  Administered 2020-04-16 (×2): 5 mg via INTRAVENOUS

## 2020-04-16 NOTE — Research (Signed)
IDENTIFY Informed Consent                  Subject Name: Sarah Goodman    Subject met inclusion and exclusion criteria.  The informed consent form, study requirements and expectations were reviewed with the subject and questions and concerns were addressed prior to the signing of the consent form.  The subject verbalized understanding of the trial requirements.  The subject agreed to participate in the IDENTIFY trial and signed the informed consent at 10:42AM on 04/16/20.  The informed consent was obtained prior to performance of any protocol-specific procedures for the subject.  A copy of the signed informed consent was given to the subject and a copy was placed in the subject's medical record.   Meade Maw, Naval architect

## 2020-05-10 ENCOUNTER — Ambulatory Visit: Payer: Medicaid Other | Admitting: Obstetrics

## 2020-05-14 NOTE — Progress Notes (Deleted)
05/15/20- 54 yoF never smoker for sleep evaluation with concern of snoring courtesy of Dr Stanford Breed Medical problem list includes Migraine, HTN, GERD, DM2, Morbid Obesity,  Med list includes albuterol hfa,  Epworth score- Body weight today- Covide vax- Flu vax-

## 2020-05-15 ENCOUNTER — Institutional Professional Consult (permissible substitution): Payer: Medicaid Other | Admitting: Internal Medicine

## 2020-05-31 NOTE — Progress Notes (Deleted)
06/03/20- 69 yoF never smoker for sleep evaluation  Courtesy of Dr Stanford Breed with concern of snoring.  Medical problem list includes Migraine, HTN, GERD, DM2, Morbid Obesity,  Meds include albuterol hfa,  Epworth score- Body weight today- Covid vax- Flu vax-

## 2020-06-03 ENCOUNTER — Institutional Professional Consult (permissible substitution): Payer: Medicaid Other | Admitting: Internal Medicine

## 2020-07-09 ENCOUNTER — Other Ambulatory Visit: Payer: Self-pay | Admitting: Cardiology

## 2020-07-12 ENCOUNTER — Telehealth: Payer: Self-pay | Admitting: Cardiology

## 2020-07-12 ENCOUNTER — Other Ambulatory Visit: Payer: Self-pay

## 2020-07-12 MED ORDER — LISINOPRIL 20 MG PO TABS: 20.0000 mg | ORAL_TABLET | Freq: Every day | ORAL | 1 refills | Status: AC

## 2020-07-12 NOTE — Telephone Encounter (Signed)
Called patient, advised that Metoprolol on file was used for her CT scan, it was not a scheduled med. The only other thing I seen was Lisinopril that would treat high BP. Patient requested this be refilled as she was out. I sent in RX to pharmacy below.  Thanks!

## 2020-07-12 NOTE — Telephone Encounter (Signed)
Pt c/o medication issue:  1. Name of Medication: metoprolol tartrate (LOPRESSOR) 100 MG tablet  2. How are you currently taking this medication (dosage and times per day)?  As written  3. Are you having a reaction (difficulty breathing--STAT)? No   4. What is your medication issue? Patient needs a new prescription sent to [place], [place]

## 2020-09-02 ENCOUNTER — Telehealth: Payer: Self-pay

## 2020-09-02 DIAGNOSIS — Z006 Encounter for examination for normal comparison and control in clinical research program: Secondary | ICD-10-CM

## 2020-09-02 NOTE — Telephone Encounter (Signed)
I have attempted without success to contact this patient by phone for her Identify 90 day follow up phone call. I was unable to leave a message. Pt's voicemail was full. An e-mail was sent to patient.

## 2020-09-03 ENCOUNTER — Ambulatory Visit: Payer: Medicaid Other | Admitting: Obstetrics

## 2021-01-23 ENCOUNTER — Ambulatory Visit: Payer: Medicaid Other | Admitting: Obstetrics

## 2021-02-26 ENCOUNTER — Ambulatory Visit: Payer: Medicaid Other | Admitting: Obstetrics

## 2021-04-03 ENCOUNTER — Other Ambulatory Visit (HOSPITAL_COMMUNITY)
Admission: RE | Admit: 2021-04-03 | Discharge: 2021-04-03 | Disposition: A | Discharge status: Home / Self Care | Payer: Medicaid Other | Source: Ambulatory Visit | Attending: Obstetrics | Admitting: Obstetrics

## 2021-04-03 ENCOUNTER — Ambulatory Visit (INDEPENDENT_AMBULATORY_CARE_PROVIDER_SITE_OTHER): Payer: Medicaid Other | Admitting: Obstetrics

## 2021-04-03 ENCOUNTER — Other Ambulatory Visit: Payer: Self-pay

## 2021-04-03 ENCOUNTER — Encounter: Payer: Self-pay | Admitting: Obstetrics

## 2021-04-03 VITALS — BP 146/87 | HR 92 | Ht 66.0 in | Wt 304.2 lb

## 2021-04-03 DIAGNOSIS — Z113 Encounter for screening for infections with a predominantly sexual mode of transmission: Secondary | ICD-10-CM

## 2021-04-03 DIAGNOSIS — Z780 Asymptomatic menopausal state: Secondary | ICD-10-CM | POA: Diagnosis not present

## 2021-04-03 DIAGNOSIS — N898 Other specified noninflammatory disorders of vagina: Secondary | ICD-10-CM | POA: Insufficient documentation

## 2021-04-03 DIAGNOSIS — Z6841 Body Mass Index (BMI) 40.0 and over, adult: Secondary | ICD-10-CM

## 2021-04-03 DIAGNOSIS — Z01419 Encounter for gynecological examination (general) (routine) without abnormal findings: Secondary | ICD-10-CM | POA: Diagnosis present

## 2021-04-03 DIAGNOSIS — G8929 Other chronic pain: Secondary | ICD-10-CM

## 2021-04-03 DIAGNOSIS — M549 Dorsalgia, unspecified: Secondary | ICD-10-CM

## 2021-04-03 DIAGNOSIS — M25512 Pain in left shoulder: Secondary | ICD-10-CM

## 2021-04-03 DIAGNOSIS — J301 Allergic rhinitis due to pollen: Secondary | ICD-10-CM

## 2021-04-03 DIAGNOSIS — M25511 Pain in right shoulder: Secondary | ICD-10-CM

## 2021-04-03 MED ORDER — IBUPROFEN 800 MG PO TABS: 800.0000 mg | ORAL_TABLET | Freq: Three times a day (TID) | ORAL | 5 refills | Status: DC | PRN

## 2021-04-03 MED ORDER — LORATADINE 10 MG PO TABS: 10.0000 mg | ORAL_TABLET | Freq: Every day | ORAL | 11 refills | Status: AC

## 2021-04-03 NOTE — Progress Notes (Signed)
Subjective:        Sarah Goodman is a 56 y.o. female here for a routine exam.  Current complaints: Vaginal discharge.    Personal health questionnaire:  Is patient [name], have a family history of breast and/or ovarian cancer: no Is there a family history of uterine cancer diagnosed at age < 49, gastrointestinal cancer, urinary tract cancer, family member who is a Field seismologist syndrome-associated carrier: no Is the patient overweight and hypertensive, family history of diabetes, personal history of gestational diabetes, preeclampsia or PCOS: no Is patient over 60, have PCOS,  family history of premature CHD under age 57, diabetes, smoke, have hypertension or peripheral artery disease:  no At any time, has a partner hit, kicked or otherwise hurt or frightened you?: no Over the past 2 weeks, have you felt down, depressed or hopeless?: no Over the past 2 weeks, have you felt little interest or pleasure in doing things?:no   Gynecologic History Patient's last menstrual period was 03/27/2016. Contraception: post menopausal status Last Pap: 2021. Results were: normal Last mammogram: January 2023. Results were: normal  Obstetric History OB History  Gravida Para Term Preterm AB Living  4 4       4   SAB IAB Ectopic Multiple Live Births          4    # Outcome Date GA Lbr Len/2nd Weight Sex Delivery Anes PTL Lv  4 Para         LIV  3 Para         LIV  2 Para         LIV  1 Para         LIV    Past Medical History:  Diagnosis Date   Chest pain    a. 05/2012 Cath: LM nl, LAD nl, LCX nl, RCA nl, EF 65%;  b. 02/2015 Echo: EF 60-65%, no rwma, Gr 1 DD, nl LA size; c. 02/2015 CTA chest: No PE.   Endometriosis    GERD (gastroesophageal reflux disease)    Hypertension    Migraine    Morbid obesity ([place])    Type II diabetes mellitus ([place])     Past Surgical History:  Procedure Laterality Date   CHOLECYSTECTOMY N/A 10/19/2016   Procedure: LAPAROSCOPIC CHOLECYSTECTOMY;  Surgeon:  Georganna Skeans, MD;  Location: [facility];  Service: General;  Laterality: N/A;   COLPOSCOPY  12/18/2011   Procedure: COLPOSCOPY;  Surgeon: Lahoma Crocker, MD;  Location: [facility];  Service: Gynecology;  Laterality: N/A;   HYSTEROSCOPY WITH D & C  12/18/2011   Procedure: DILATATION AND CURETTAGE /HYSTEROSCOPY;  Surgeon: Lahoma Crocker, MD;  Location: [facility];  Service: Gynecology;  Laterality: N/A;   LEFT HEART CATHETERIZATION WITH CORONARY ANGIOGRAM N/A 05/26/2012   Procedure: LEFT HEART CATHETERIZATION WITH CORONARY ANGIOGRAM;  Surgeon: Sinclair Grooms, MD;  Location: [facility] CATH LAB;  Service: Cardiovascular;  Laterality: N/A;     Current Outpatient Medications:    albuterol (PROVENTIL HFA;VENTOLIN HFA) 108 (90 BASE) MCG/ACT inhaler, Inhale 2 puffs into the lungs 3 (three) times daily as needed for wheezing or shortness of breath., Disp: , Rfl:    cyclobenzaprine (FLEXERIL) 10 MG tablet, TAKE 1 TABLET (10 MG TOTAL) BY MOUTH EVERY 8 (EIGHT) HOURS AS NEEDED FOR MUSCLE SPASMS., Disp: 30 tablet, Rfl: 1   furosemide (LASIX) 20 MG tablet, Take 20 mg by mouth daily., Disp: , Rfl:    LEVEMIR FLEXTOUCH 100 UNIT/ML Pen, Inject 20 Units into the  skin at bedtime. , Disp: , Rfl: 0   lisinopril (ZESTRIL) 20 MG tablet, Take 1 tablet (20 mg total) by mouth daily., Disp: 90 tablet, Rfl: 1   pantoprazole (PROTONIX) 40 MG tablet, Take 40 mg by mouth daily., Disp: , Rfl:    clotrimazole (LOTRIMIN) 1 % cream, Apply 1 application topically 2 (two) times daily. (Patient not taking: Reported on 04/03/2021), Disp: 113 g, Rfl: 2   metoprolol tartrate (LOPRESSOR) 100 MG tablet, TAKE 2 HOURS PRIOR TO CT SCAN (Patient not taking: Reported on 04/03/2021), Disp: 1 tablet, Rfl: 0   terconazole (TERAZOL 7) 0.4 % vaginal cream, Place 1 applicator vaginally at bedtime. (Patient not taking: Reported on 04/03/2021), Disp: 45 g, Rfl: 2   tinidazole (TINDAMAX) 500 MG tablet, Take 2 tablets (1,000 mg total) by mouth daily with breakfast.  (Patient not taking: Reported on 04/03/2021), Disp: 10 tablet, Rfl: 2 Allergies  Allergen Reactions   Fluconazole Rash    Skin burns    Social History   Tobacco Use   Smoking status: Never   Smokeless tobacco: Never  Substance Use Topics   Alcohol use: No    Alcohol/week: 0.0 standard drinks    Family History  Problem Relation Age of Onset   Diabetes Mother    Hypertension Mother    CAD Mother 44       Blockage in artery and stent in heart   Irregular heart beat Mother       Review of Systems  Constitutional: negative for fatigue and weight loss Respiratory: negative for cough and wheezing Cardiovascular: negative for chest pain, fatigue and palpitations Gastrointestinal: negative for abdominal pain and change in bowel habits Musculoskeletal: positive for shoulder and back pain Neurological: negative for gait problems and tremors Behavioral/Psych: negative for abusive relationship, depression Endocrine: negative for temperature intolerance    Genitourinary: positive for vaginal discharge.  negative for abnormal menstrual periods, genital lesions, hot flashes, sexual problems  Integument/breast: negative for breast lump, breast tenderness, nipple discharge and skin lesion(s)    Objective:       BP (!) 146/87    Pulse 92    Ht 5\' 6"  (1.676 m)    Wt (!) 304 lb 3.2 oz (138 kg)    LMP 03/27/2016 Comment: negative beta HCG 03/09/18   BMI 49.10 kg/m  General:   Alert and no distress  Skin:   no rash or abnormalities  Lungs:   clear to auscultation bilaterally  Heart:   regular rate and rhythm, S1, S2 normal, no murmur, click, rub or gallop  Breasts:   normal without suspicious masses, skin or nipple changes or axillary nodes  Abdomen:  normal findings: no organomegaly, soft, non-tender and no hernia  Pelvis:  External genitalia: normal general appearance Urinary system: urethral meatus normal and bladder without fullness, nontender Vaginal: normal without tenderness,  induration or masses Cervix: normal appearance Adnexa: normal bimanual exam Uterus: anteverted and non-tender, normal size   Lab Review Urine pregnancy test Labs reviewed yes Radiologic studies reviewed yes  I have spent a total of 20 minutes of face-to-face time, excluding clinical staff time, reviewing notes and preparing to see patient, ordering tests and/or medications, and counseling the patient.   Assessment:    1. Encounter for gynecological examination with Papanicolaou smear of cervix Rx: - Cytology - PAP( [place])  2. Postmenopausal - doing well  3. Vaginal discharge Rx: - Cervicovaginal ancillary only( [place])  4. Screening for STD (sexually transmitted disease) Rx: -  HIV antibody (with reflex) - RPR - Hepatitis B Surface AntiGEN - Hepatitis C Antibody  5. Class 3 severe obesity due to excess calories without serious comorbidity with body mass index (BMI) of 45.0 to 49.9 in adult (HCC) - weight reduction with the aid of dietary changes, exercise and behavioral modification recommended  6. Seasonal allergic rhinitis due to pollen Rx: - loratadine (CLARITIN) 10 MG tablet; Take 1 tablet (10 mg total) by mouth daily.  Dispense: 30 tablet; Refill: 11  7. Backache symptom Rx: - ibuprofen (ADVIL) 800 MG tablet; Take 1 tablet (800 mg total) by mouth every 8 (eight) hours as needed.  Dispense: 30 tablet; Refill: 5 - Ambulatory referral to Plastic Surgery  8. Chronic pain of both shoulders Rx: - Ambulatory referral to Plastic Surgery      Plan:    Education reviewed: calcium supplements, depression evaluation, low fat, low cholesterol diet, safe sex/STD prevention, self breast exams, and weight bearing exercise. Follow up in: 1 year.     Shelly Bombard, MD 04/03/2021 10:30 AM

## 2021-04-03 NOTE — Progress Notes (Signed)
Pt presents for annual exam with STD testing. She has no concerns at this time.  Last PAP: 07-21-19 Last mammogram: 02-2021 ([facility])  PHQ9=3 GAD7=0

## 2021-04-04 LAB — CERVICOVAGINAL ANCILLARY ONLY
Bacterial Vaginitis (gardnerella): NEGATIVE
Candida Glabrata: NEGATIVE
Candida Vaginitis: NEGATIVE
Chlamydia: NEGATIVE
Comment: NEGATIVE
Comment: NEGATIVE
Comment: NEGATIVE
Comment: NEGATIVE
Comment: NEGATIVE
Comment: NORMAL
Neisseria Gonorrhea: NEGATIVE
Trichomonas: NEGATIVE

## 2021-04-04 LAB — RPR: RPR Ser Ql: NONREACTIVE

## 2021-04-04 LAB — HIV ANTIBODY (ROUTINE TESTING W REFLEX): HIV Screen 4th Generation wRfx: NONREACTIVE

## 2021-04-04 LAB — HEPATITIS C ANTIBODY: Hep C Virus Ab: NONREACTIVE

## 2021-04-04 LAB — HEPATITIS B SURFACE ANTIGEN: Hepatitis B Surface Ag: NEGATIVE

## 2021-04-07 LAB — CYTOLOGY - PAP
Comment: NEGATIVE
Diagnosis: NEGATIVE
High risk HPV: NEGATIVE

## 2021-07-09 ENCOUNTER — Ambulatory Visit: Payer: Medicaid Other | Admitting: Nurse Practitioner

## 2021-07-09 NOTE — Progress Notes (Deleted)
Office Visit    Patient Name: Sarah Goodman Date of Encounter: 07/09/2021  Primary Care Provider:  Benito Mccreedy, MD Primary Cardiologist:  Kirk Ruths, MD  Chief Complaint    56 year old female with a history of atypical chest pain, palpitations, grade 1 diastolic dysfunction, hypertension, type 2 diabetes, GERD, and obesity who presents for follow-up related to chest pain and palpitations.  Past Medical History    Past Medical History:  Diagnosis Date   Chest pain    a. 05/2012 Cath: LM nl, LAD nl, LCX nl, RCA nl, EF 65%;  b. 02/2015 Echo: EF 60-65%, no rwma, Gr 1 DD, nl LA size; c. 02/2015 CTA chest: No PE.   Endometriosis    GERD (gastroesophageal reflux disease)    Hypertension    Migraine    Morbid obesity ([place])    Type II diabetes mellitus ([place])    Past Surgical History:  Procedure Laterality Date   CHOLECYSTECTOMY N/A 10/19/2016   Procedure: LAPAROSCOPIC CHOLECYSTECTOMY;  Surgeon: Georganna Skeans, MD;  Location: [facility];  Service: General;  Laterality: N/A;   COLPOSCOPY  12/18/2011   Procedure: COLPOSCOPY;  Surgeon: Lahoma Crocker, MD;  Location: [facility];  Service: Gynecology;  Laterality: N/A;   HYSTEROSCOPY WITH D & C  12/18/2011   Procedure: DILATATION AND CURETTAGE /HYSTEROSCOPY;  Surgeon: Lahoma Crocker, MD;  Location: [facility];  Service: Gynecology;  Laterality: N/A;   LEFT HEART CATHETERIZATION WITH CORONARY ANGIOGRAM N/A 05/26/2012   Procedure: LEFT HEART CATHETERIZATION WITH CORONARY ANGIOGRAM;  Surgeon: Sinclair Grooms, MD;  Location: [facility] CATH LAB;  Service: Cardiovascular;  Laterality: N/A;    Allergies  Allergies  Allergen Reactions   Fluconazole Rash    Skin burns    History of Present Illness    57 year old female with the above past medical history including atypical chest pain, palpitations, grade 1 diastolic dysfunction, hypertension, type 2 diabetes, GERD, and obesity.  Cardiac catheterization in 2014 showed no evidence of CAD.   Echocardiogram in January 2020 showed vigorous LV function, moderate LVH, grade 1 DD.  Monitor in February 2020 showed sinus rhythm with rare nonconducted PACs and rare PVCs.  He was last seen in the office on 04/02/2020 and noted some mild dyspnea on exertion, nonradiating continuous chest heaviness, and occasional palpitations.  Coronary CT angiogram showed no evidence of CAD, calcium score of 0.  Additionally, she was referred for a sleep study.  She presents today for follow-up.  Since her last visit  Atypical chest pain: Palpitations: Hypertension: Type 2 diabetes: Obesity: Disposition: Home Medications    Current Outpatient Medications  Medication Sig Dispense Refill   albuterol (PROVENTIL HFA;VENTOLIN HFA) 108 (90 BASE) MCG/ACT inhaler Inhale 2 puffs into the lungs 3 (three) times daily as needed for wheezing or shortness of breath.     clotrimazole (LOTRIMIN) 1 % cream Apply 1 application topically 2 (two) times daily. (Patient not taking: Reported on 04/03/2021) 113 g 2   cyclobenzaprine (FLEXERIL) 10 MG tablet TAKE 1 TABLET (10 MG TOTAL) BY MOUTH EVERY 8 (EIGHT) HOURS AS NEEDED FOR MUSCLE SPASMS. 30 tablet 1   furosemide (LASIX) 20 MG tablet Take 20 mg by mouth daily.     ibuprofen (ADVIL) 800 MG tablet Take 1 tablet (800 mg total) by mouth every 8 (eight) hours as needed. 30 tablet 5   LEVEMIR FLEXTOUCH 100 UNIT/ML Pen Inject 20 Units into the skin at bedtime.   0   lisinopril (ZESTRIL) 20 MG tablet Take 1  tablet (20 mg total) by mouth daily. 90 tablet 1   loratadine (CLARITIN) 10 MG tablet Take 1 tablet (10 mg total) by mouth daily. 30 tablet 11   metoprolol tartrate (LOPRESSOR) 100 MG tablet TAKE 2 HOURS PRIOR TO CT SCAN (Patient not taking: Reported on 04/03/2021) 1 tablet 0   pantoprazole (PROTONIX) 40 MG tablet Take 40 mg by mouth daily.     terconazole (TERAZOL 7) 0.4 % vaginal cream Place 1 applicator vaginally at bedtime. (Patient not taking: Reported on 04/03/2021) 45 g 2    tinidazole (TINDAMAX) 500 MG tablet Take 2 tablets (1,000 mg total) by mouth daily with breakfast. (Patient not taking: Reported on 04/03/2021) 10 tablet 2   No current facility-administered medications for this visit.     Review of Systems    ***.  All other systems reviewed and are otherwise negative except as noted above.    Physical Exam    VS:  LMP 03/27/2016 Comment: negative beta HCG 03/09/18 , BMI There is no height or weight on file to calculate BMI.     GEN: Well nourished, well developed, in no acute distress. HEENT: normal. Neck: Supple, no JVD, carotid bruits, or masses. Cardiac: RRR, no murmurs, rubs, or gallops. No clubbing, cyanosis, edema.  Radials/DP/PT 2+ and equal bilaterally.  Respiratory:  Respirations regular and unlabored, clear to auscultation bilaterally. GI: Soft, nontender, nondistended, BS + x 4. MS: no deformity or atrophy. Skin: warm and dry, no rash. Neuro:  Strength and sensation are intact. Psych: Normal affect.  Accessory Clinical Findings    ECG personally reviewed by me today - *** - no acute changes.  Lab Results  Component Value Date   WBC 13.6 (H) 03/09/2018   HGB 14.3 03/09/2018   HCT 42.4 03/09/2018   MCV 89.6 03/09/2018   PLT 430 (H) 03/09/2018   Lab Results  Component Value Date   CREATININE 0.75 03/09/2018   BUN 16 03/09/2018   NA 132 (L) 03/09/2018   K 4.7 03/09/2018   CL 93 (L) 03/09/2018   CO2 24 03/09/2018   Lab Results  Component Value Date   ALT 25 03/09/2018   AST 20 03/09/2018   ALKPHOS 56 03/09/2018   BILITOT 1.1 03/09/2018   Lab Results  Component Value Date   CHOL 156 05/25/2012   HDL 27 (L) 05/25/2012   LDLCALC 77 05/25/2012   TRIG 261 (H) 05/25/2012   CHOLHDL 5.8 05/25/2012    Lab Results  Component Value Date   HGBA1C 9.1 (H) 02/11/2018    Assessment & Plan    1.  ***   Lenna Sciara, NP 07/09/2021, 7:48 AM

## 2021-07-14 ENCOUNTER — Encounter: Payer: Self-pay | Admitting: Nurse Practitioner

## 2021-08-06 ENCOUNTER — Telehealth: Payer: Self-pay | Admitting: Nurse Practitioner

## 2021-08-06 NOTE — Telephone Encounter (Signed)
Called pt's main number was not the pt's. The voicemail stated another person name. Did not LVM. Called Emergency contact the pt's daughter, the daughter did not answer. LVM to call back due to getting return mail and missing appt. 06.28.2023 BM

## 2021-08-10 NOTE — Progress Notes (Deleted)
Cardiology Clinic Note   Patient Name: Sarah Goodman Date of Encounter: 08/10/2021  Primary Care Provider:  Benito Mccreedy, MD Primary Cardiologist:  Kirk Ruths, MD  Patient Profile    Sarah Goodman 56 year old female presents to the clinic today for follow-up evaluation of her hypertension and chest discomfort.  Past Medical History    Past Medical History:  Diagnosis Date   Chest pain    a. 05/2012 Cath: LM nl, LAD nl, LCX nl, RCA nl, EF 65%;  b. 02/2015 Echo: EF 60-65%, no rwma, Gr 1 DD, nl LA size; c. 02/2015 CTA chest: No PE.   Endometriosis    GERD (gastroesophageal reflux disease)    Hypertension    Migraine    Morbid obesity ([place])    Type II diabetes mellitus ([place])    Past Surgical History:  Procedure Laterality Date   CHOLECYSTECTOMY N/A 10/19/2016   Procedure: LAPAROSCOPIC CHOLECYSTECTOMY;  Surgeon: Georganna Skeans, MD;  Location: [facility];  Service: General;  Laterality: N/A;   COLPOSCOPY  12/18/2011   Procedure: COLPOSCOPY;  Surgeon: Lahoma Crocker, MD;  Location: [facility];  Service: Gynecology;  Laterality: N/A;   HYSTEROSCOPY WITH D & C  12/18/2011   Procedure: DILATATION AND CURETTAGE /HYSTEROSCOPY;  Surgeon: Lahoma Crocker, MD;  Location: [facility];  Service: Gynecology;  Laterality: N/A;   LEFT HEART CATHETERIZATION WITH CORONARY ANGIOGRAM N/A 05/26/2012   Procedure: LEFT HEART CATHETERIZATION WITH CORONARY ANGIOGRAM;  Surgeon: Sinclair Grooms, MD;  Location: [facility] CATH LAB;  Service: Cardiovascular;  Laterality: N/A;    Allergies  Allergies  Allergen Reactions   Fluconazole Rash    Skin burns    History of Present Illness    Sarah Goodman has a PMH of chest discomfort, endometriosis, hypertension, GERD, migraine, morbid obesity, and type 2 diabetes.  She underwent cardiac catheterization 4/14 that showed normal coronary anatomy and an EF of 65%.  Her echocardiogram 1/17 showed an LVEF of 60 to 65%, G1 DD.  Her chest CTA 1/17 showed no PE.   Her cardiac event monitor 2/20 showed sinus rhythm with rare PACs and rare PVCs.  She was seen in follow-up by Dr. Stanford Breed on 04/02/2020.  During that time she noted some dyspnea on exertion.  She also noted chest heaviness that was continuous.  She denied radiation.  Her chest discomfort was not pleuritic.  She did note occasional palpitations.  She presents to the clinic today for follow-up evaluation states***  *** denies chest pain, shortness of breath, lower extremity edema, fatigue, palpitations, melena, hematuria, hemoptysis, diaphoresis, weakness, presyncope, syncope, orthopnea, and PND.  Precordial pain-stable/chronic.  Denies increased pain/pressure with increased physical activity.  Underwent cardiac catheterization 4/14 which showed normal coronary anatomy and LVEF of 65%.  Coronary CTA 04/16/2020 showed a coronary calcium score of 0. Continue metoprolol Heart healthy low-sodium diet-salty 6 given Increase physical activity as tolerated  Essential hypertension-BP today***.  Well-controlled at home. Continue metoprolol, lisinopril Heart healthy low-sodium diet-salty 6 given Increase physical activity as tolerated  Palpitations-continues to notice occasional brief episodes of palpitations.  Wore a cardiac event monitor 2/20 which showed normal sinus rhythm with rare PACs and PVCs. Continue metoprolol Increase p.o. hydration Increase physical activity as tolerated Avoid triggers caffeine, chocolate, EtOH, dehydration etc.  Morbid obesity-weight today***. Increase physical activity as tolerated Calorie restricted diet.   Follows with PCP  Disposition: Follow-up with Dr. Stanford Breed or me in 12 months.  Home Medications    Prior to Admission medications  Medication Sig Start Date End Date Taking? Authorizing Provider  albuterol (PROVENTIL HFA;VENTOLIN HFA) 108 (90 BASE) MCG/ACT inhaler Inhale 2 puffs into the lungs 3 (three) times daily as needed for wheezing or shortness of  breath.    [provider]  clotrimazole (LOTRIMIN) 1 % cream Apply 1 application topically 2 (two) times daily. Patient not taking: Reported on 04/03/2021 07/21/19   Shelly Bombard, MD  cyclobenzaprine (FLEXERIL) 10 MG tablet TAKE 1 TABLET (10 MG TOTAL) BY MOUTH EVERY 8 (EIGHT) HOURS AS NEEDED FOR MUSCLE SPASMS. 08/17/17   Shelly Bombard, MD  furosemide (LASIX) 20 MG tablet Take 20 mg by mouth daily.    [provider]  ibuprofen (ADVIL) 800 MG tablet Take 1 tablet (800 mg total) by mouth every 8 (eight) hours as needed. 04/03/21   Shelly Bombard, MD  LEVEMIR FLEXTOUCH 100 UNIT/ML Pen Inject 20 Units into the skin at bedtime.  03/10/16   [provider]  lisinopril (ZESTRIL) 20 MG tablet Take 1 tablet (20 mg total) by mouth daily. 07/12/20   Lelon Perla, MD  loratadine (CLARITIN) 10 MG tablet Take 1 tablet (10 mg total) by mouth daily. 04/03/21   Shelly Bombard, MD  metoprolol tartrate (LOPRESSOR) 100 MG tablet TAKE 2 HOURS PRIOR TO CT SCAN Patient not taking: Reported on 04/03/2021 04/02/20   Lelon Perla, MD  pantoprazole (PROTONIX) 40 MG tablet Take 40 mg by mouth daily. 03/11/18   [provider]  terconazole (TERAZOL 7) 0.4 % vaginal cream Place 1 applicator vaginally at bedtime. Patient not taking: Reported on 04/03/2021 07/21/19   Shelly Bombard, MD  tinidazole [facility]) 500 MG tablet Take 2 tablets (1,000 mg total) by mouth daily with breakfast. Patient not taking: Reported on 04/03/2021 07/24/19   Shelly Bombard, MD    Family History    Family History  Problem Relation Age of Onset   Diabetes Mother    Hypertension Mother    CAD Mother 17       Blockage in artery and stent in heart   Irregular heart beat Mother    She indicated that her mother is alive. She indicated that her father is alive.  Social History    Social History   Socioeconomic History   Marital status: Single    Spouse name: Not on file   Number of  children: 4   Years of education: 12   Highest education level: Not on file  Occupational History   Occupation: Unemployed  Tobacco Use   Smoking status: Never   Smokeless tobacco: Never  Vaping Use   Vaping Use: Never used  Substance and Sexual Activity   Alcohol use: No    Alcohol/week: 0.0 standard drinks of alcohol   Drug use: No   Sexual activity: Not Currently    Partners: Male    Birth control/protection: Post-menopausal  Other Topics Concern   Not on file  Social History Narrative   Lives at home with her 2 sons.  Grown dtr out of the house.   Right-handed.   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   Retired Quarry manager.   Social Determinants of Health   Financial Resource Strain: Not on file  Food Insecurity: Not on file  Transportation Needs: Not on file  Physical Activity: Not on file  Stress: Not on file  Social Connections: Not on file  Intimate Partner Violence: Not on file     Review of Systems    General:  No  chills, fever, night sweats or weight changes.  Cardiovascular:  No chest pain, dyspnea on exertion, edema, orthopnea, palpitations, paroxysmal nocturnal dyspnea. Dermatological: No rash, lesions/masses Respiratory: No cough, dyspnea Urologic: No hematuria, dysuria Abdominal:   No nausea, vomiting, diarrhea, bright red blood per rectum, melena, or hematemesis Neurologic:  No visual changes, wkns, changes in mental status. All other systems reviewed and are otherwise negative except as noted above.  Physical Exam    VS:  LMP 03/27/2016 Comment: negative beta HCG 03/09/18 , BMI There is no height or weight on file to calculate BMI. GEN: Well nourished, well developed, in no acute distress. HEENT: normal. Neck: Supple, no JVD, carotid bruits, or masses. Cardiac: RRR, no murmurs, rubs, or gallops. No clubbing, cyanosis, edema.  Radials/DP/PT 2+ and equal bilaterally.  Respiratory:  Respirations regular and unlabored, clear to auscultation bilaterally. GI: Soft, nontender,  nondistended, BS + x 4. MS: no deformity or atrophy. Skin: warm and dry, no rash. Neuro:  Strength and sensation are intact. Psych: Normal affect.  Accessory Clinical Findings    Recent Labs: No results found for requested labs within last 365 days.   Recent Lipid Panel    Component Value Date/Time   CHOL 156 05/25/2012 1042   TRIG 261 (H) 05/25/2012 1042   HDL 27 (L) 05/25/2012 1042   CHOLHDL 5.8 05/25/2012 1042   VLDL 52 (H) 05/25/2012 1042   LDLCALC 77 05/25/2012 1042    ECG personally reviewed by me today- *** - No acute changes  Echocardiogram 02/11/2018 Study Conclusions   - Procedure narrative: Transthoracic echocardiography. Image    quality was suboptimal. The study was technically difficult, as a    result of poor sound wave transmission and body habitus.    Intravenous contrast (Definity) was administered.  - Left ventricle: The cavity size was normal. Wall thickness was    increased in a pattern of moderate LVH. Systolic function was    vigorous. The estimated ejection fraction was in the range of 65%    to 70%. Wall motion was normal; there were no regional wall    motion abnormalities. Doppler parameters are consistent with    abnormal left ventricular relaxation (grade 1 diastolic    dysfunction). No change from prior ECHO.  Coronary CTA 04/26/2020 IMPRESSION: 1. Coronary calcium score of 0. This was 0 percentile for age and sex matched control.   2. Normal coronary origin with probable right dominance (PDA not well seen).   3. No evidence of CAD; CAD RADS 0; note this was a technically difficult study and distal vessels not well visualized.   4. Dilated pulmonary artery suggestive of pulmonary hypertension.   Kirk Ruths     Electronically Signed   By: Kirk Ruths M.D.   On: 04/16/2020 14:54  Assessment & Plan   1.  ***   Jossie Ng. Marquest Gunkel NP-C    08/10/2021, 1:34 PM [facility] [address] Office [phone] Fax [phone]  Notice: This dictation was prepared with Dragon dictation along with smaller phrase technology. Any transcriptional errors that result from this process are unintentional and may not be corrected upon review.  I spent***minutes examining this patient, reviewing medications, and using patient centered shared decision making involving her cardiac care.  Prior to her visit I spent greater than 20 minutes reviewing her past medical history,  medications, and prior cardiac tests.

## 2021-08-13 ENCOUNTER — Ambulatory Visit: Payer: Medicaid Other | Admitting: General Practice

## 2021-08-13 NOTE — Progress Notes (Deleted)
Cardiology Clinic Note   Patient Name: Sarah Goodman Date of Encounter: 08/13/2021  Primary Care Provider:  Benito Mccreedy, MD Primary Cardiologist:  Kirk Ruths, MD  Patient Profile    Sarah Goodman 56 year old female presents to the clinic today for follow-up evaluation of her hypertension and chest discomfort.  Past Medical History    Past Medical History:  Diagnosis Date   Chest pain    a. 05/2012 Cath: LM nl, LAD nl, LCX nl, RCA nl, EF 65%;  b. 02/2015 Echo: EF 60-65%, no rwma, Gr 1 DD, nl LA size; c. 02/2015 CTA chest: No PE.   Endometriosis    GERD (gastroesophageal reflux disease)    Hypertension    Migraine    Morbid obesity ([place])    Type II diabetes mellitus ([place])    Past Surgical History:  Procedure Laterality Date   CHOLECYSTECTOMY N/A 10/19/2016   Procedure: LAPAROSCOPIC CHOLECYSTECTOMY;  Surgeon: Georganna Skeans, MD;  Location: [place];  Service: General;  Laterality: N/A;   COLPOSCOPY  12/18/2011   Procedure: COLPOSCOPY;  Surgeon: Lahoma Crocker, MD;  Location: [facility];  Service: Gynecology;  Laterality: N/A;   HYSTEROSCOPY WITH D & C  12/18/2011   Procedure: DILATATION AND CURETTAGE /HYSTEROSCOPY;  Surgeon: Lahoma Crocker, MD;  Location: [facility];  Service: Gynecology;  Laterality: N/A;   LEFT HEART CATHETERIZATION WITH CORONARY ANGIOGRAM N/A 05/26/2012   Procedure: LEFT HEART CATHETERIZATION WITH CORONARY ANGIOGRAM;  Surgeon: Sinclair Grooms, MD;  Location: [facility] CATH LAB;  Service: Cardiovascular;  Laterality: N/A;    Allergies  Allergies  Allergen Reactions   Fluconazole Rash    Skin burns    History of Present Illness    [name] has a PMH of chest discomfort, endometriosis, hypertension, GERD, migraine, morbid obesity, and type 2 diabetes.  She underwent cardiac catheterization 4/14 that showed normal coronary anatomy and an EF of 65%.  Her echocardiogram 1/17 showed an LVEF of 60 to 65%, G1 DD.  Her chest CTA 1/17 showed no PE.   Her cardiac event monitor 2/20 showed sinus rhythm with rare PACs and rare PVCs.  She was seen in follow-up by Dr. Stanford Breed on 04/02/2020.  During that time she noted some dyspnea on exertion.  She also noted chest heaviness that was continuous.  She denied radiation.  Her chest discomfort was not pleuritic.  She did note occasional palpitations.  She presents to the clinic today for follow-up evaluation states***  *** denies chest pain, shortness of breath, lower extremity edema, fatigue, palpitations, melena, hematuria, hemoptysis, diaphoresis, weakness, presyncope, syncope, orthopnea, and PND.  Precordial pain-stable/chronic.  Denies increased pain/pressure with increased physical activity.  Underwent cardiac catheterization 4/14 which showed normal coronary anatomy and LVEF of 65%.  Coronary CTA 04/16/2020 showed a coronary calcium score of 0. Continue metoprolol Heart healthy low-sodium diet-salty 6 given Increase physical activity as tolerated  Essential hypertension-BP today***.  Well-controlled at home. Continue metoprolol, lisinopril Heart healthy low-sodium diet-salty 6 given Increase physical activity as tolerated  Palpitations-continues to notice occasional brief episodes of palpitations.  Wore a cardiac event monitor 2/20 which showed normal sinus rhythm with rare PACs and PVCs. Continue metoprolol Increase p.o. hydration Increase physical activity as tolerated Avoid triggers caffeine, chocolate, EtOH, dehydration etc.  Morbid obesity-weight today***. Increase physical activity as tolerated Calorie restricted diet.   Follows with PCP  Disposition: Follow-up with Dr. Stanford Breed or me in 12 months.  Home Medications    Prior to Admission medications  Medication Sig Start Date End Date Taking? Authorizing Provider  albuterol (PROVENTIL HFA;VENTOLIN HFA) 108 (90 BASE) MCG/ACT inhaler Inhale 2 puffs into the lungs 3 (three) times daily as needed for wheezing or shortness of  breath.    [provider]  clotrimazole (LOTRIMIN) 1 % cream Apply 1 application topically 2 (two) times daily. Patient not taking: Reported on 04/03/2021 07/21/19   Shelly Bombard, MD  cyclobenzaprine (FLEXERIL) 10 MG tablet TAKE 1 TABLET (10 MG TOTAL) BY MOUTH EVERY 8 (EIGHT) HOURS AS NEEDED FOR MUSCLE SPASMS. 08/17/17   Shelly Bombard, MD  furosemide (LASIX) 20 MG tablet Take 20 mg by mouth daily.    [provider]  ibuprofen (ADVIL) 800 MG tablet Take 1 tablet (800 mg total) by mouth every 8 (eight) hours as needed. 04/03/21   Shelly Bombard, MD  LEVEMIR FLEXTOUCH 100 UNIT/ML Pen Inject 20 Units into the skin at bedtime.  03/10/16   [provider]  lisinopril (ZESTRIL) 20 MG tablet Take 1 tablet (20 mg total) by mouth daily. 07/12/20   Lelon Perla, MD  loratadine (CLARITIN) 10 MG tablet Take 1 tablet (10 mg total) by mouth daily. 04/03/21   Shelly Bombard, MD  metoprolol tartrate (LOPRESSOR) 100 MG tablet TAKE 2 HOURS PRIOR TO CT SCAN Patient not taking: Reported on 04/03/2021 04/02/20   Lelon Perla, MD  pantoprazole (PROTONIX) 40 MG tablet Take 40 mg by mouth daily. 03/11/18   [provider]  terconazole (TERAZOL 7) 0.4 % vaginal cream Place 1 applicator vaginally at bedtime. Patient not taking: Reported on 04/03/2021 07/21/19   Shelly Bombard, MD  tinidazole [facility]) 500 MG tablet Take 2 tablets (1,000 mg total) by mouth daily with breakfast. Patient not taking: Reported on 04/03/2021 07/24/19   Shelly Bombard, MD    Family History    Family History  Problem Relation Age of Onset   Diabetes Mother    Hypertension Mother    CAD Mother 5       Blockage in artery and stent in heart   Irregular heart beat Mother    She indicated that her mother is alive. She indicated that her father is alive.  Social History    Social History   Socioeconomic History   Marital status: Single    Spouse name: Not on file   Number of  children: 4   Years of education: 12   Highest education level: Not on file  Occupational History   Occupation: Unemployed  Tobacco Use   Smoking status: Never   Smokeless tobacco: Never  Vaping Use   Vaping Use: Never used  Substance and Sexual Activity   Alcohol use: No    Alcohol/week: 0.0 standard drinks of alcohol   Drug use: No   Sexual activity: Not Currently    Partners: Male    Birth control/protection: Post-menopausal  Other Topics Concern   Not on file  Social History Narrative   Lives at home with her 2 sons.  Grown dtr out of the house.   Right-handed.   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   Retired Quarry manager.   Social Determinants of Health   Financial Resource Strain: Not on file  Food Insecurity: Not on file  Transportation Needs: Not on file  Physical Activity: Not on file  Stress: Not on file  Social Connections: Not on file  Intimate Partner Violence: Not on file     Review of Systems    General:  No  chills, fever, night sweats or weight changes.  Cardiovascular:  No chest pain, dyspnea on exertion, edema, orthopnea, palpitations, paroxysmal nocturnal dyspnea. Dermatological: No rash, lesions/masses Respiratory: No cough, dyspnea Urologic: No hematuria, dysuria Abdominal:   No nausea, vomiting, diarrhea, bright red blood per rectum, melena, or hematemesis Neurologic:  No visual changes, wkns, changes in mental status. All other systems reviewed and are otherwise negative except as noted above.  Physical Exam    VS:  LMP 03/27/2016 Comment: negative beta HCG 03/09/18 , BMI There is no height or weight on file to calculate BMI. GEN: Well nourished, well developed, in no acute distress. HEENT: normal. Neck: Supple, no JVD, carotid bruits, or masses. Cardiac: RRR, no murmurs, rubs, or gallops. No clubbing, cyanosis, edema.  Radials/DP/PT 2+ and equal bilaterally.  Respiratory:  Respirations regular and unlabored, clear to auscultation bilaterally. GI: Soft, nontender,  nondistended, BS + x 4. MS: no deformity or atrophy. Skin: warm and dry, no rash. Neuro:  Strength and sensation are intact. Psych: Normal affect.  Accessory Clinical Findings    Recent Labs: No results found for requested labs within last 365 days.   Recent Lipid Panel    Component Value Date/Time   CHOL 156 05/25/2012 1042   TRIG 261 (H) 05/25/2012 1042   HDL 27 (L) 05/25/2012 1042   CHOLHDL 5.8 05/25/2012 1042   VLDL 52 (H) 05/25/2012 1042   LDLCALC 77 05/25/2012 1042    ECG personally reviewed by me today- *** - No acute changes  Echocardiogram 02/11/2018 Study Conclusions   - Procedure narrative: Transthoracic echocardiography. Image    quality was suboptimal. The study was technically difficult, as a    result of poor sound wave transmission and body habitus.    Intravenous contrast (Definity) was administered.  - Left ventricle: The cavity size was normal. Wall thickness was    increased in a pattern of moderate LVH. Systolic function was    vigorous. The estimated ejection fraction was in the range of 65%    to 70%. Wall motion was normal; there were no regional wall    motion abnormalities. Doppler parameters are consistent with    abnormal left ventricular relaxation (grade 1 diastolic    dysfunction). No change from prior ECHO.  Coronary CTA 04/26/2020 IMPRESSION: 1. Coronary calcium score of 0. This was 0 percentile for age and sex matched control.   2. Normal coronary origin with probable right dominance (PDA not well seen).   3. No evidence of CAD; CAD RADS 0; note this was a technically difficult study and distal vessels not well visualized.   4. Dilated pulmonary artery suggestive of pulmonary hypertension.   Kirk Ruths     Electronically Signed   By: Kirk Ruths M.D.   On: 04/16/2020 14:54  Assessment & Plan   1.  ***   Jossie Ng. Charrie Mcconnon NP-C    08/13/2021, 12:56 PM [place] [address] Office [phone] Fax [phone]  Notice: This dictation was prepared with Dragon dictation along with smaller phrase technology. Any transcriptional errors that result from this process are unintentional and may not be corrected upon review.  I spent***minutes examining this patient, reviewing medications, and using patient centered shared decision making involving her cardiac care.  Prior to her visit I spent greater than 20 minutes reviewing her past medical history,  medications, and prior cardiac tests.

## 2021-08-15 ENCOUNTER — Ambulatory Visit: Payer: Medicaid Other | Admitting: General Practice

## 2021-08-18 ENCOUNTER — Emergency Department (HOSPITAL_COMMUNITY): Payer: Medicaid Other

## 2021-08-18 ENCOUNTER — Emergency Department (HOSPITAL_COMMUNITY)
Admission: EM | Admit: 2021-08-18 | Discharge: 2021-08-18 | Discharge status: Against Medical Advice | Payer: Medicaid Other | Attending: Emergency Medicine | Admitting: Emergency Medicine

## 2021-08-18 ENCOUNTER — Encounter (HOSPITAL_COMMUNITY): Payer: Self-pay

## 2021-08-18 ENCOUNTER — Other Ambulatory Visit: Payer: Self-pay

## 2021-08-18 DIAGNOSIS — R0602 Shortness of breath: Secondary | ICD-10-CM | POA: Diagnosis not present

## 2021-08-18 DIAGNOSIS — R079 Chest pain, unspecified: Secondary | ICD-10-CM | POA: Diagnosis present

## 2021-08-18 DIAGNOSIS — Z5321 Procedure and treatment not carried out due to patient leaving prior to being seen by health care provider: Secondary | ICD-10-CM | POA: Insufficient documentation

## 2021-08-18 LAB — TROPONIN I (HIGH SENSITIVITY): Troponin I (High Sensitivity): 3 ng/L

## 2021-08-18 LAB — BASIC METABOLIC PANEL WITH GFR
Anion gap: 9 (ref 5–15)
BUN: 11 mg/dL (ref 6–20)
CO2: 23 mmol/L (ref 22–32)
Calcium: 8.8 mg/dL — ABNORMAL LOW (ref 8.9–10.3)
Chloride: 101 mmol/L (ref 98–111)
Creatinine, Ser: 0.78 mg/dL (ref 0.44–1.00)
GFR, Estimated: >60 mL/min
Glucose, Bld: 278 mg/dL — ABNORMAL HIGH (ref 70–99)
Potassium: 4.1 mmol/L (ref 3.5–5.1)
Sodium: 133 mmol/L — ABNORMAL LOW (ref 135–145)

## 2021-08-18 LAB — CBC
HCT: 36.3 % (ref 36.0–46.0)
Hemoglobin: 12.2 g/dL (ref 12.0–15.0)
MCH: 30 pg (ref 26.0–34.0)
MCHC: 33.6 g/dL (ref 30.0–36.0)
MCV: 89.4 fL (ref 80.0–100.0)
Platelets: 314 K/uL (ref 150–400)
RBC: 4.06 MIL/uL (ref 3.87–5.11)
RDW: 13 % (ref 11.5–15.5)
WBC: 7.3 K/uL (ref 4.0–10.5)
nRBC: 0 % (ref 0.0–0.2)

## 2021-08-18 LAB — I-STAT BETA HCG BLOOD, ED (MC, WL, AP ONLY): I-stat hCG, quantitative: <5 m[IU]/mL

## 2021-08-18 NOTE — ED Notes (Signed)
Pt left stickers with registration and decided to leave

## 2021-08-18 NOTE — ED Triage Notes (Signed)
Pt arrived POV from home c/o intermittent centralized CP that got worse today but started 2 days ago. Pt denies any N/V but endorses SHOB. Pt states the pain does not radiate.

## 2021-08-20 ENCOUNTER — Encounter (HOSPITAL_COMMUNITY): Payer: Self-pay

## 2021-09-02 NOTE — Progress Notes (Deleted)
Office Visit    Patient Name: Sarah Goodman Date of Encounter: 09/02/2021  PCP:  Benito Mccreedy, MD   [place]  Cardiologist:  Kirk Ruths, MD  Advanced Practice Provider:  No care team member to display Electrophysiologist:  None   Chief Complaint    Sarah Goodman is a 56 y.o. female with a past medical history significant for chest pain (cardiac cath 05/2012), GERD, hypertension, obesity, type 2 diabetes mellitus presents today for routine follow-up.   Patient presented to the ED 08/18/2021 but then decided to leave.   She was last seen by Dr. Stanford Breed 03/2020.  Echocardiogram January 2020 showed vigorous LV function, moderate left ventricular perjury and grade 1 DD.  Holter monitor February 2020 showed sinus rhythm with a rare nonconducted PAC and rare PVC.  She had some dyspnea on exertion and chest heaviness that was continuous without any radiation.  She did have occasional palpitations.  Cardiac CTA was ordered which revealed a coronary calcium score of 0.  No plaque seen in any coronaries.  Today, she ***   Past Medical History    Past Medical History:  Diagnosis Date   Chest pain    a. 05/2012 Cath: LM nl, LAD nl, LCX nl, RCA nl, EF 65%;  b. 02/2015 Echo: EF 60-65%, no rwma, Gr 1 DD, nl LA size; c. 02/2015 CTA chest: No PE.   Endometriosis    GERD (gastroesophageal reflux disease)    Hypertension    Migraine    Morbid obesity ([place])    Type II diabetes mellitus ([place])    Past Surgical History:  Procedure Laterality Date   CHOLECYSTECTOMY N/A 10/19/2016   Procedure: LAPAROSCOPIC CHOLECYSTECTOMY;  Surgeon: Georganna Skeans, MD;  Location: [place];  Service: General;  Laterality: N/A;   COLPOSCOPY  12/18/2011   Procedure: COLPOSCOPY;  Surgeon: Lahoma Crocker, MD;  Location: [facility];  Service: Gynecology;  Laterality: N/A;   HYSTEROSCOPY WITH D & C  12/18/2011   Procedure: DILATATION AND CURETTAGE /HYSTEROSCOPY;  Surgeon: Lahoma Crocker, MD;  Location: [facility];  Service: Gynecology;  Laterality: N/A;   LEFT HEART CATHETERIZATION WITH CORONARY ANGIOGRAM N/A 05/26/2012   Procedure: LEFT HEART CATHETERIZATION WITH CORONARY ANGIOGRAM;  Surgeon: Sinclair Grooms, MD;  Location: [facility] C[facility];  Service: Cardiovascular;  Laterality: N/A;    Allergies  Allergies  Allergen Reactions   Fluconazole Rash    Skin burns     EKGs/Labs/Other Studies Reviewed:   The following studies were reviewed today:  04/16/20 Coronary CTA  IMPRESSION: 1. Coronary calcium score of 0. This was 0 percentile for age and sex matched control.   2. Normal coronary origin with probable right dominance (PDA not well seen).   3. No evidence of CAD; CAD RADS 0; note this was a technically difficult study and distal vessels not well visualized.   4. Dilated pulmonary artery suggestive of pulmonary hypertension.  Holter monitor 03/14/2018 No significant arrhythmias   Kirk Ruths EKG:  EKG is *** ordered today.  The ekg ordered today demonstrates ***  Recent Labs: 08/18/2021: BUN 11; Creatinine, Ser 0.78; Hemoglobin 12.2; Platelets 314; Potassium 4.1; Sodium 133  Recent Lipid Panel    Component Value Date/Time   CHOL 156 05/25/2012 1042   TRIG 261 (H) 05/25/2012 1042   HDL 27 (L) 05/25/2012 1042   CHOLHDL 5.8 05/25/2012 1042   VLDL 52 (H) 05/25/2012 1042   LDLCALC 77 05/25/2012 1042    Risk Assessment/Calculations:  {  Does this patient have ATRIAL FIBRILLATION?:[phone]}  Home Medications   No outpatient medications have been marked as taking for the 09/03/21 encounter (Appointment) with Elgie Collard, PA-C.     Review of Systems   ***   All other systems reviewed and are otherwise negative except as noted above.  Physical Exam    VS:  LMP 03/27/2016 Comment: negative beta HCG 03/09/18 , BMI There is no height or weight on file to calculate BMI.  Wt Readings from Last 3 Encounters:  08/18/21 (!) 305 lb (138.3 kg)   04/03/21 (!) 304 lb 3.2 oz (138 kg)  04/02/20 (!) 307 lb 12.8 oz (139.6 kg)     GEN: Well nourished, well developed, in no acute distress. HEENT: normal. Neck: Supple, no JVD, carotid bruits, or masses. Cardiac: ***RRR, no murmurs, rubs, or gallops. No clubbing, cyanosis, edema.  ***Radials/PT 2+ and equal bilaterally.  Respiratory:  ***Respirations regular and unlabored, clear to auscultation bilaterally. GI: Soft, nontender, nondistended. MS: No deformity or atrophy. Skin: Warm and dry, no rash. Neuro:  Strength and sensation are intact. Psych: Normal affect.  Assessment & Plan    Chest pain  Palpitations  Hypertension  Morbid obesity  No BP recorded.  {Refresh Note OR Click here to enter BP  :1}***      Disposition: Follow up {follow up:15908} with Kirk Ruths, MD or APP.  Signed, Elgie Collard, PA-C 09/02/2021, 1:23 PM [facility]

## 2021-09-03 ENCOUNTER — Ambulatory Visit: Payer: Medicaid Other | Admitting: Physician Assistant

## 2021-09-03 DIAGNOSIS — R002 Palpitations: Secondary | ICD-10-CM

## 2021-09-03 DIAGNOSIS — R079 Chest pain, unspecified: Secondary | ICD-10-CM

## 2021-09-03 DIAGNOSIS — I10 Essential (primary) hypertension: Secondary | ICD-10-CM

## 2021-09-15 ENCOUNTER — Ambulatory Visit: Payer: Medicaid Other | Admitting: General Practice

## 2021-09-16 ENCOUNTER — Encounter: Payer: Self-pay | Admitting: Physician Assistant

## 2021-09-16 ENCOUNTER — Ambulatory Visit (INDEPENDENT_AMBULATORY_CARE_PROVIDER_SITE_OTHER): Payer: Medicaid Other | Admitting: Physician Assistant

## 2021-09-16 VITALS — BP 132/82 | Wt 297.6 lb

## 2021-09-16 DIAGNOSIS — I10 Essential (primary) hypertension: Secondary | ICD-10-CM | POA: Diagnosis not present

## 2021-09-16 DIAGNOSIS — E119 Type 2 diabetes mellitus without complications: Secondary | ICD-10-CM | POA: Diagnosis not present

## 2021-09-16 DIAGNOSIS — E1169 Type 2 diabetes mellitus with other specified complication: Secondary | ICD-10-CM

## 2021-09-16 DIAGNOSIS — R0789 Other chest pain: Secondary | ICD-10-CM

## 2021-09-16 DIAGNOSIS — R400 Somnolence: Secondary | ICD-10-CM

## 2021-09-16 NOTE — Progress Notes (Unsigned)
Cardiology Office Note:    Date:  09/16/2021   ID:  Sarah Goodman, DOB 05/23/1965, MRN [id number]  PCP:  Sarah Mccreedy, MD   [place] Providers Cardiologist:  Kirk Ruths, MD { Click to update primary MD,subspecialty MD or APP then REFRESH:1}    Referring MD: Sarah Mccreedy, MD   No chief complaint on file. ***  History of Present Illness:    Sarah Goodman is a 56 y.o. female with a hx of high tension, GERD, endometriosis, morbid obesity and DM2.  Patient has been followed by cardiology service for palpitation and chest pain.  She had a normal cardiac catheterization back in 2014.  Echocardiogram in January 2020 showed vigorous LV function, moderate LVH, grade 1 DD.  Holter monitor in February 2020 showed sinus rhythm with rare nonconducted PAC and a rare PVCs.  Patient was last seen by Dr. Stanford Breed in February 2022 at which time she described dyspnea on exertion and the chest heaviness.  She was referred to pulmonology service.  A coronary CT was obtained on 04/16/2020 that showed a coronary calcium score of 0, no evidence of CAD, dilated pulmonary artery suggestive of pulmonary hypertension, no significant extracardiac finding.  More recently, patient presented to the emergency room on 08/18/2021, however left before she was seen.  Patient presents today for evaluation of atypical chest pain.  The atypical chest pain has been going on for several month, she described as a dull ache that started in the abdomen that radiated up to the chest.  Symptoms typically occur after eating and she feels solid foods tend to get stuck in the esophagus.  The symptom is also accompanied by nausea and vomiting.  Duration of the chest discomfort can last days at a time.  Given negative troponin despite prolonged episode of chest pain, normal coronary artery on previous coronary CT in 2022, atypical nature of her symptom, I did not recommend any further study.  I would recommend  she follow-up with her GI doctor.  She does have daytime somnolence with fatigue, I recommended a sleep study.  She can follow-up with Dr. Stanford Breed in 1 year.  Depend on the result of the sleep study, she may need a earlier visit with Dr. Claiborne Billings or Dr. Radford Pax.  Past Medical History:  Diagnosis Date   Chest pain    a. 05/2012 Cath: LM nl, LAD nl, LCX nl, RCA nl, EF 65%;  b. 02/2015 Echo: EF 60-65%, no rwma, Gr 1 DD, nl LA size; c. 02/2015 CTA chest: No PE.   Endometriosis    GERD (gastroesophageal reflux disease)    Hypertension    Migraine    Morbid obesity ([place])    Type II diabetes mellitus ([place])     Past Surgical History:  Procedure Laterality Date   CHOLECYSTECTOMY N/A 10/19/2016   Procedure: LAPAROSCOPIC CHOLECYSTECTOMY;  Surgeon: Georganna Skeans, MD;  Location: [place];  Service: General;  Laterality: N/A;   COLPOSCOPY  12/18/2011   Procedure: COLPOSCOPY;  Surgeon: Lahoma Crocker, MD;  Location: [facility];  Service: Gynecology;  Laterality: N/A;   HYSTEROSCOPY WITH D & C  12/18/2011   Procedure: DILATATION AND CURETTAGE /HYSTEROSCOPY;  Surgeon: Lahoma Crocker, MD;  Location: [facility];  Service: Gynecology;  Laterality: N/A;   LEFT HEART CATHETERIZATION WITH CORONARY ANGIOGRAM N/A 05/26/2012   Procedure: LEFT HEART CATHETERIZATION WITH CORONARY ANGIOGRAM;  Surgeon: Sinclair Grooms, MD;  Location: [facility] CATH LAB;  Service: Cardiovascular;  Laterality: N/A;  Current Medications: No outpatient medications have been marked as taking for the 09/16/21 encounter (Appointment) with Almyra Deforest, [place].     Allergies:   Fluconazole   Social History   Socioeconomic History   Marital status: Single    Spouse name: Not on file   Number of children: 4   Years of education: 12   Highest education level: Not on file  Occupational History   Occupation: Unemployed  Tobacco Use   Smoking status: Never   Smokeless tobacco: Never  Vaping Use   Vaping Use: Never used  Substance and Sexual Activity    Alcohol use: No    Alcohol/week: 0.0 standard drinks of alcohol   Drug use: No   Sexual activity: Not Currently    Partners: Male    Birth control/protection: Post-menopausal  Other Topics Concern   Not on file  Social History Narrative   ** Merged History Encounter **       Lives at home with her 2 sons.  Grown dtr out of the house. Right-handed. 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 Retired Quarry manager.   Social Determinants of Health   Financial Resource Strain: Not on file  Food Insecurity: Not on file  Transportation Needs: Not on file  Physical Activity: Not on file  Stress: Not on file  Social Connections: Not on file     Family History: The patient's ***family history includes CAD (age of onset: 62) in her mother; Diabetes in her mother; Hypertension in her mother; Irregular heart beat in her mother.  ROS:   Please see the history of present illness.    *** All other systems reviewed and are negative.  EKGs/Labs/Other Studies Reviewed:    The following studies were reviewed today: ***  EKG:  EKG is *** ordered today.  The ekg ordered today demonstrates ***  Recent Labs: 08/18/2021: BUN 11; Creatinine, Ser 0.78; Hemoglobin 12.2; Platelets 314; Potassium 4.1; Sodium 133  Recent Lipid Panel    Component Value Date/Time   CHOL 156 05/25/2012 1042   TRIG 261 (H) 05/25/2012 1042   HDL 27 (L) 05/25/2012 1042   CHOLHDL 5.8 05/25/2012 1042   VLDL 52 (H) 05/25/2012 1042   LDLCALC 77 05/25/2012 1042     Risk Assessment/Calculations:   {Does this patient have ATRIAL FIBRILLATION?:[id number]}       Physical Exam:    VS:  LMP 03/27/2016 Comment: negative beta HCG 03/09/18    No BP recorded.  {Refresh Note OR Click here to enter BP  :1}***   Wt Readings from Last 3 Encounters:  08/18/21 (!) 305 lb (138.3 kg)  04/03/21 (!) 304 lb 3.2 oz (138 kg)  04/02/20 (!) 307 lb 12.8 oz (139.6 kg)     GEN: *** Well nourished, well developed in no acute distress HEENT: Normal NECK: No JVD; No  carotid bruits LYMPHATICS: No lymphadenopathy CARDIAC: ***RRR, no murmurs, rubs, gallops RESPIRATORY:  Clear to auscultation without rales, wheezing or rhonchi  ABDOMEN: Soft, non-tender, non-distended MUSCULOSKELETAL:  No edema; No deformity  SKIN: Warm and dry NEUROLOGIC:  Alert and oriented x 3 PSYCHIATRIC:  Normal affect   ASSESSMENT:    No diagnosis found. PLAN:    In order of problems listed above:  ***      {Are you ordering a CV Procedure (e.g. stress test, cath, DCCV, TEE, etc)?   Press F2        :[id number]}    Medication Adjustments/Labs and Tests Ordered: Current medicines are reviewed at length with the patient today.  Concerns regarding medicines are outlined above.  No orders of the defined types were placed in this encounter.  No orders of the defined types were placed in this encounter.   There are no Patient Instructions on file for this visit.   Hilbert Corrigan, [place]  09/16/2021 10:11 AM    [place]

## 2021-09-16 NOTE — Patient Instructions (Signed)
Medication Instructions:  Your physician recommends that you continue on your current medications as directed. Please refer to the Current Medication list given to you today.  *If you need a refill on your cardiac medications before your next appointment, please call your pharmacy*  Lab Work: NONE ordered at this time of appointment   If you have labs (blood work) drawn today and your tests are completely normal, you will receive your results only by: [place] (if you have MyChart) OR A paper copy in the mail If you have any lab test that is abnormal or we need to change your treatment, we will call you to review the results.  Testing/Procedures: Your physician has recommended that you have a sleep study. This test records several body functions during sleep, including: brain activity, eye movement, oxygen and carbon dioxide blood levels, heart rate and rhythm, breathing rate and rhythm, the flow of air through your mouth and nose, snoring, body muscle movements, and chest and belly movement.  Follow-Up: At [facility], you and your health needs are our priority.  As part of our continuing mission to provide you with exceptional heart care, we have created designated Provider Care Teams.  These Care Teams include your primary Cardiologist (physician) and Advanced Practice Providers (APPs -  Physician Assistants and Nurse Practitioners) who all work together to provide you with the care you need, when you need it.   Your next appointment:   1 year(s)  The format for your next appointment:   In Person  Provider:   Kirk Ruths, MD     Other Instructions Follow up with [facility]   Important Information About [place]

## 2021-09-18 ENCOUNTER — Encounter: Payer: Self-pay | Admitting: Physician Assistant

## 2021-10-20 ENCOUNTER — Other Ambulatory Visit: Payer: Self-pay | Admitting: Obstetrics

## 2021-10-20 DIAGNOSIS — M549 Dorsalgia, unspecified: Secondary | ICD-10-CM

## 2021-10-22 ENCOUNTER — Ambulatory Visit (HOSPITAL_COMMUNITY)
Admission: EM | Admit: 2021-10-22 | Discharge: 2021-10-22 | Disposition: A | Discharge status: Home / Self Care | Payer: Medicaid Other | Attending: Physician Assistant | Admitting: Physician Assistant

## 2021-10-22 ENCOUNTER — Encounter (HOSPITAL_COMMUNITY): Payer: Self-pay

## 2021-10-22 DIAGNOSIS — Z794 Long term (current) use of insulin: Secondary | ICD-10-CM | POA: Insufficient documentation

## 2021-10-22 DIAGNOSIS — Z20822 Contact with and (suspected) exposure to covid-19: Secondary | ICD-10-CM | POA: Insufficient documentation

## 2021-10-22 DIAGNOSIS — J069 Acute upper respiratory infection, unspecified: Secondary | ICD-10-CM | POA: Insufficient documentation

## 2021-10-22 DIAGNOSIS — E1165 Type 2 diabetes mellitus with hyperglycemia: Secondary | ICD-10-CM | POA: Diagnosis present

## 2021-10-22 MED ORDER — LEVEMIR FLEXTOUCH 100 UNIT/ML ~~LOC~~ SOPN: 20.0000 [IU] | PEN_INJECTOR | Freq: Every day | SUBCUTANEOUS | 0 refills | Status: DC

## 2021-10-22 NOTE — ED Provider Notes (Signed)
[place]    CSN: [id number] Arrival date & time: 10/22/21  1522      History   Chief Complaint Chief Complaint  Patient presents with   Covid Exposure    HPI Sarah Goodman is a 56 y.o. female.   Patient here today for evaluation of cough and headache that started today after COVID exposure 2 to 3 days ago.  She has not had any fever.  She denies any sore throat or ear pain.  She has not any nausea, vomiting or diarrhea.  She does not report treatment for symptoms.  She also requests refill of insulin.  She reports that her primary care doctor is no longer accepting Medicaid patients and she has been unable to establish care elsewhere She has been out of her insulin for the last 5 days.  She reports previously her dose of insulin had not caused any hypoglycemia or other side effects.  The history is provided by the patient.    Past Medical History:  Diagnosis Date   Chest pain    a. 05/2012 Cath: LM nl, LAD nl, LCX nl, RCA nl, EF 65%;  b. 02/2015 Echo: EF 60-65%, no rwma, Gr 1 DD, nl LA size; c. 02/2015 CTA chest: No PE.   Endometriosis    GERD (gastroesophageal reflux disease)    Hypertension    Migraine    Morbid obesity ([place])    Type II diabetes mellitus ([place])     Patient Active Problem List   Diagnosis Date Noted   Hypokalemia 02/11/2018   HTN (hypertension) 02/11/2018   Migraine 07/24/2015   Postmenopausal bleeding 04/02/2015   Nausea    Morbid obesity ([place])    Type II diabetes mellitus ([place])    Hive 03/18/2015   Positive D dimer 03/18/2015   Chest pressure 03/17/2015   Chest pain 03/17/2015   Chronic migraine 02/27/2015   Dysplasia of cervix, low grade (CIN 1) 08/05/2012   UTI (urinary tract infection) 05/24/2012   Dehydration 05/24/2012   GERD (gastroesophageal reflux disease) 05/24/2012   Morbid obesity with body mass index of 50.0-59.9 in adult [facility]) 05/24/2012   Vertigo 05/23/2012    Class: Acute   Chest discomfort 05/23/2012     Class: Acute   Diabetes mellitus type II, uncontrolled 05/23/2012   Abnormal uterine bleeding 12/18/2011   Low grade squamous intraepithelial lesion (LGSIL) on Pap smear 12/18/2011    Past Surgical History:  Procedure Laterality Date   CHOLECYSTECTOMY N/A 10/19/2016   Procedure: LAPAROSCOPIC CHOLECYSTECTOMY;  Surgeon: Georganna Skeans, MD;  Location: [place];  Service: General;  Laterality: N/A;   COLPOSCOPY  12/18/2011   Procedure: COLPOSCOPY;  Surgeon: Lahoma Crocker, MD;  Location: [facility];  Service: Gynecology;  Laterality: N/A;   HYSTEROSCOPY WITH D & C  12/18/2011   Procedure: DILATATION AND CURETTAGE /HYSTEROSCOPY;  Surgeon: Lahoma Crocker, MD;  Location: [facility];  Service: Gynecology;  Laterality: N/A;   LEFT HEART CATHETERIZATION WITH CORONARY ANGIOGRAM N/A 05/26/2012   Procedure: LEFT HEART CATHETERIZATION WITH CORONARY ANGIOGRAM;  Surgeon: Sinclair Grooms, MD;  Location: [facility] CATH LAB;  Service: Cardiovascular;  Laterality: N/A;    OB History     Gravida  4   Para  4   Term  0   Preterm  0   AB  0   Living  4      SAB  0   IAB  0   Ectopic  0   Multiple  Live Births  4            Home Medications    Prior to Admission medications   Medication Sig Start Date End Date Taking? Authorizing Provider  albuterol (PROVENTIL HFA;VENTOLIN HFA) 108 (90 BASE) MCG/ACT inhaler Inhale 2 puffs into the lungs 3 (three) times daily as needed for wheezing or shortness of breath.    [provider]  clotrimazole (LOTRIMIN) 1 % cream Apply 1 application topically 2 (two) times daily. Patient not taking: Reported on 09/16/2021 07/21/19   Shelly Bombard, MD  cyclobenzaprine (FLEXERIL) 10 MG tablet TAKE 1 TABLET (10 MG TOTAL) BY MOUTH EVERY 8 (EIGHT) HOURS AS NEEDED FOR MUSCLE SPASMS. 08/17/17   Shelly Bombard, MD  furosemide (LASIX) 20 MG tablet Take 20 mg by mouth daily.    [provider]  ibuprofen (ADVIL) 800 MG tablet TAKE 1 TABLET (800 MG  TOTAL) BY MOUTH EVERY 8 (EIGHT) HOURS AS NEEDED. 10/20/21   Shelly Bombard, MD  LEVEMIR FLEXTOUCH 100 UNIT/ML FlexTouch Pen Inject 20 Units into the skin at bedtime. 10/22/21   Francene Finders, PA-C  lisinopril (ZESTRIL) 20 MG tablet Take 1 tablet (20 mg total) by mouth daily. 07/12/20   Lelon Perla, MD  loratadine (CLARITIN) 10 MG tablet Take 1 tablet (10 mg total) by mouth daily. 04/03/21   Shelly Bombard, MD  pantoprazole (PROTONIX) 40 MG tablet Take 40 mg by mouth daily. 03/11/18   [provider]  terconazole (TERAZOL 7) 0.4 % vaginal cream Place 1 applicator vaginally at bedtime. 07/21/19   Shelly Bombard, MD  tinidazole (TINDAMAX) 500 MG tablet Take 2 tablets (1,000 mg total) by mouth daily with breakfast. 07/24/19   Shelly Bombard, MD    Family History Family History  Problem Relation Age of Onset   Diabetes Mother    Hypertension Mother    CAD Mother 50       Blockage in artery and stent in heart   Irregular heart beat Mother     Social History Social History   Tobacco Use   Smoking status: Never   Smokeless tobacco: Never  Vaping Use   Vaping Use: Never used  Substance Use Topics   Alcohol use: No    Alcohol/week: 0.0 standard drinks of alcohol   Drug use: No     Allergies   Fluconazole   Review of Systems Review of Systems  Constitutional:  Negative for chills and fever.  HENT:  Negative for congestion, ear pain and sore throat.   Eyes:  Negative for discharge and redness.  Respiratory:  Positive for cough. Negative for shortness of breath and wheezing.   Gastrointestinal:  Negative for abdominal pain, diarrhea, nausea and vomiting.  Neurological:  Positive for headaches.     Physical Exam Triage Vital Signs ED Triage Vitals  Enc Vitals Group     BP 10/22/21 1645 138/83     Pulse Rate 10/22/21 1645 94     Resp 10/22/21 1645 12     Temp 10/22/21 1645 98 F (36.7 C)     Temp src --      SpO2 10/22/21 1645 98 %     Weight  10/22/21 1642 (!) 320 lb (145.2 kg)     Height --      Head Circumference --      Peak Flow --      Pain Score 10/22/21 1642 0     Pain Loc --  Pain Edu? --      Excl. in [place]? --    No data found.  Updated Vital Signs BP 138/83 (BP Location: Left Arm)   Pulse 94   Temp 98 F (36.7 C)   Resp 12   Wt (!) 320 lb (145.2 kg)   LMP 03/27/2016 Comment: negative beta HCG 03/09/18  SpO2 98%   BMI 50.12 kg/m      Physical Exam Vitals and nursing note reviewed.  Constitutional:      General: She is not in acute distress.    Appearance: Normal appearance. She is not ill-appearing.  HENT:     Head: Normocephalic and atraumatic.     Nose: Congestion present.     Mouth/Throat:     Mouth: Mucous membranes are moist.     Pharynx: No oropharyngeal exudate or posterior oropharyngeal erythema.  Eyes:     Conjunctiva/sclera: Conjunctivae normal.  Cardiovascular:     Rate and Rhythm: Normal rate and regular rhythm.     Heart sounds: Normal heart sounds. No murmur heard. Pulmonary:     Effort: Pulmonary effort is normal. No respiratory distress.     Breath sounds: Normal breath sounds. No wheezing, rhonchi or rales.  Skin:    General: Skin is warm and dry.  Neurological:     Mental Status: She is alert.  Psychiatric:        Mood and Affect: Mood normal.        Thought Content: Thought content normal.      UC Treatments / Results  Labs (all labs ordered are listed, but only abnormal results are displayed) Labs Reviewed  SARS CORONAVIRUS 2 (TAT 6-24 HRS)    EKG   Radiology No results found.  Procedures Procedures (including critical care time)  Medications Ordered in UC Medications - No data to display  Initial Impression / Assessment and Plan / UC Course  I have reviewed the triage vital signs and the nursing notes.  Pertinent labs & imaging results that were available during my care of the patient were reviewed by me and considered in my medical decision making  (see chart for details).    Given known COVID exposure will order COVID screening.  Will await results for further recommendation.  Insulin refilled at previously listed dose as requested.  Encouraged follow-up with any further concerns.  Final Clinical Impressions(s) / UC Diagnoses   Final diagnoses:  Type 2 diabetes mellitus with hyperglycemia, with long-term current use of insulin ([place])  Exposure to COVID-19 virus  Acute upper respiratory infection   Discharge Instructions   None    ED Prescriptions     Medication Sig Dispense Auth. Provider   LEVEMIR FLEXTOUCH 100 UNIT/ML FlexTouch Pen Inject 20 Units into the skin at bedtime. 15 mL Francene Finders, PA-C      PDMP not reviewed this encounter.   Francene Finders, PA-C 10/22/21 1730

## 2021-10-22 NOTE — ED Triage Notes (Signed)
Pt is here for covid exposure 2 days ago . Pt complains of headache and cough x1day

## 2021-10-23 LAB — SARS CORONAVIRUS 2 (TAT 6-24 HRS): SARS Coronavirus 2: NEGATIVE

## 2021-10-30 ENCOUNTER — Ambulatory Visit (HOSPITAL_BASED_OUTPATIENT_CLINIC_OR_DEPARTMENT_OTHER): Payer: Medicaid Other | Attending: Physician Assistant | Admitting: Cardiovascular Disease

## 2022-01-14 ENCOUNTER — Ambulatory Visit (HOSPITAL_COMMUNITY)
Admission: EM | Admit: 2022-01-14 | Discharge: 2022-01-14 | Disposition: A | Discharge status: Home / Self Care | Payer: Medicaid Other | Attending: Physician Assistant | Admitting: Physician Assistant

## 2022-01-14 ENCOUNTER — Encounter (HOSPITAL_COMMUNITY): Payer: Self-pay | Admitting: Emergency Medicine

## 2022-01-14 DIAGNOSIS — J069 Acute upper respiratory infection, unspecified: Secondary | ICD-10-CM | POA: Diagnosis present

## 2022-01-14 DIAGNOSIS — U071 COVID-19: Secondary | ICD-10-CM | POA: Insufficient documentation

## 2022-01-14 DIAGNOSIS — J4521 Mild intermittent asthma with (acute) exacerbation: Secondary | ICD-10-CM | POA: Insufficient documentation

## 2022-01-14 LAB — RESP PANEL BY RT-PCR (FLU A&B, COVID) ARPGX2
Influenza A by PCR: NEGATIVE
Influenza B by PCR: NEGATIVE
SARS Coronavirus 2 by RT PCR: POSITIVE — AB

## 2022-01-14 MED ORDER — ALBUTEROL SULFATE HFA 108 (90 BASE) MCG/ACT IN AERS: 2.0000 | INHALATION_SPRAY | Freq: Four times a day (QID) | RESPIRATORY_TRACT | 0 refills | Status: DC | PRN

## 2022-01-14 MED ORDER — PROMETHAZINE-DM 6.25-15 MG/5ML PO SYRP: 5.0000 mL | ORAL_SOLUTION | Freq: Four times a day (QID) | ORAL | 0 refills | Status: DC | PRN

## 2022-01-14 MED ORDER — FLUTICASONE PROPIONATE 50 MCG/ACT NA SUSP
1.0000 | Freq: Every day | NASAL | 0 refills | Status: DC
Start: 2022-01-14 — End: 2022-03-03

## 2022-01-14 MED ORDER — BUDESONIDE-FORMOTEROL FUMARATE 80-4.5 MCG/ACT IN AERO: 2.0000 | INHALATION_SPRAY | Freq: Two times a day (BID) | RESPIRATORY_TRACT | 0 refills | Status: DC

## 2022-01-14 MED ORDER — IPRATROPIUM-ALBUTEROL 0.5-2.5 (3) MG/3ML IN SOLN
RESPIRATORY_TRACT | Status: AC
  Filled 2022-01-14: qty 3

## 2022-01-14 MED ORDER — IPRATROPIUM-ALBUTEROL 0.5-2.5 (3) MG/3ML IN SOLN
3.0000 mL | Freq: Once | RESPIRATORY_TRACT | Status: AC
  Administered 2022-01-14: 3 mL via RESPIRATORY_TRACT

## 2022-01-14 NOTE — Discharge Instructions (Addendum)
We will contact you if you are positive for COVID or flu.  Please monitor your MyChart for these results.  Use albuterol every 4-6 hours as needed.  Start Symbicort twice a day.  Make sure to rinse your mouth following use of this medication to prevent thrush.  Use over-the-counter antihistamine as well as prescribed Flonase for additional symptom relief.  Take Promethazine DM for cough.  This will make you sleepy so do not drive or drink alcohol while taking it.  Ensure you rest and drink plenty of fluids.  Follow-up with your primary care soon as possible; call them to schedule an appointment.  If anything changes or worsens and you have worsening cough, shortness of breath, nausea/vomiting interfere with oral intake, weakness you need to be seen immediately.

## 2022-01-14 NOTE — ED Provider Notes (Signed)
[place]    CSN: [id number] Arrival date & time: 01/14/22  1751      History   Chief Complaint Chief Complaint  Patient presents with   Emesis   Diarrhea    HPI Sarah Goodman is a 56 y.o. female.   Patient presents today with a 5-day history of URI symptoms including cough, nausea, vomiting, diarrhea, chills, body aches, congestion, fever.  Denies any chest pain or shortness of breath.  She has been using hot tea and steam to help manage symptoms without improvement.  Does report household sick contacts with similar symptoms.  She has had COVID in the past with last episode approximately 2 years ago.  She has been vaccinated for COVID-19.  Denies any recent antibiotics or steroids.  She does have a history of asthma but has not been using albuterol recently as she is without this medication.  Denies previous hospitalization or intubation related to this condition.  She does have a history of diabetes and reports that her blood sugars have not been adequately controlled.  Last metabolic panel from 0/25/8527 showed hyperglycemia with blood sugar of 278.    Past Medical History:  Diagnosis Date   Chest pain    a. 05/2012 Cath: LM nl, LAD nl, LCX nl, RCA nl, EF 65%;  b. 02/2015 Echo: EF 60-65%, no rwma, Gr 1 DD, nl LA size; c. 02/2015 CTA chest: No PE.   Endometriosis    GERD (gastroesophageal reflux disease)    Hypertension    Migraine    Morbid obesity ([place])    Type II diabetes mellitus ([place])     Patient Active Problem List   Diagnosis Date Noted   Hypokalemia 02/11/2018   HTN (hypertension) 02/11/2018   Migraine 07/24/2015   Postmenopausal bleeding 04/02/2015   Nausea    Morbid obesity ([place])    Type II diabetes mellitus ([place])    Hive 03/18/2015   Positive D dimer 03/18/2015   Chest pressure 03/17/2015   Chest pain 03/17/2015   Chronic migraine 02/27/2015   Dysplasia of cervix, low grade (CIN 1) 08/05/2012   UTI (urinary tract infection) 05/24/2012    Dehydration 05/24/2012   GERD (gastroesophageal reflux disease) 05/24/2012   Morbid obesity with body mass index of 50.0-59.9 in adult [facility]) 05/24/2012   Vertigo 05/23/2012    Class: Acute   Chest discomfort 05/23/2012    Class: Acute   Diabetes mellitus type II, uncontrolled 05/23/2012   Abnormal uterine bleeding 12/18/2011   Low grade squamous intraepithelial lesion (LGSIL) on Pap smear 12/18/2011    Past Surgical History:  Procedure Laterality Date   CHOLECYSTECTOMY N/A 10/19/2016   Procedure: LAPAROSCOPIC CHOLECYSTECTOMY;  Surgeon: Georganna Skeans, MD;  Location: [place];  Service: General;  Laterality: N/A;   COLPOSCOPY  12/18/2011   Procedure: COLPOSCOPY;  Surgeon: Lahoma Crocker, MD;  Location: [facility];  Service: Gynecology;  Laterality: N/A;   HYSTEROSCOPY WITH D & C  12/18/2011   Procedure: DILATATION AND CURETTAGE /HYSTEROSCOPY;  Surgeon: Lahoma Crocker, MD;  Location: [facility];  Service: Gynecology;  Laterality: N/A;   LEFT HEART CATHETERIZATION WITH CORONARY ANGIOGRAM N/A 05/26/2012   Procedure: LEFT HEART CATHETERIZATION WITH CORONARY ANGIOGRAM;  Surgeon: Sinclair Grooms, MD;  Location: [facility] CATH LAB;  Service: Cardiovascular;  Laterality: N/A;    OB History     Gravida  4   Para  4   Term  0   Preterm  0   AB  0  Living  4      SAB  0   IAB  0   Ectopic  0   Multiple      Live Births  4            Home Medications    Prior to Admission medications   Medication Sig Start Date End Date Taking? Authorizing Provider  budesonide-formoterol (SYMBICORT) 80-4.5 MCG/ACT inhaler Inhale 2 puffs into the lungs in the morning and at bedtime. 01/14/22  Yes Elliona Doddridge K, PA-C  fluticasone (FLONASE) 50 MCG/ACT nasal spray Place 1 spray into both nostrils daily. 01/14/22  Yes Menelik Mcfarren K, PA-C  promethazine-dextromethorphan (PROMETHAZINE-DM) 6.25-15 MG/5ML syrup Take 5 mLs by mouth 4 (four) times daily as needed for cough. 01/14/22  Yes Jayant Kriz K,  PA-C  albuterol (VENTOLIN HFA) 108 (90 Base) MCG/ACT inhaler Inhale 2 puffs into the lungs every 6 (six) hours as needed for wheezing or shortness of breath. 01/14/22   Irish Breisch, Derry Skill, PA-C  clotrimazole (LOTRIMIN) 1 % cream Apply 1 application topically 2 (two) times daily. Patient not taking: Reported on 09/16/2021 07/21/19   Shelly Bombard, MD  cyclobenzaprine (FLEXERIL) 10 MG tablet TAKE 1 TABLET (10 MG TOTAL) BY MOUTH EVERY 8 (EIGHT) HOURS AS NEEDED FOR MUSCLE SPASMS. 08/17/17   Shelly Bombard, MD  furosemide (LASIX) 20 MG tablet Take 20 mg by mouth daily.    [provider]  ibuprofen (ADVIL) 800 MG tablet TAKE 1 TABLET (800 MG TOTAL) BY MOUTH EVERY 8 (EIGHT) HOURS AS NEEDED. 10/20/21   Shelly Bombard, MD  LEVEMIR FLEXTOUCH 100 UNIT/ML FlexTouch Pen Inject 20 Units into the skin at bedtime. 10/22/21   Francene Finders, PA-C  lisinopril (ZESTRIL) 20 MG tablet Take 1 tablet (20 mg total) by mouth daily. 07/12/20   Lelon Perla, MD  loratadine (CLARITIN) 10 MG tablet Take 1 tablet (10 mg total) by mouth daily. 04/03/21   Shelly Bombard, MD  pantoprazole (PROTONIX) 40 MG tablet Take 40 mg by mouth daily. 03/11/18   [provider]  terconazole (TERAZOL 7) 0.4 % vaginal cream Place 1 applicator vaginally at bedtime. 07/21/19   Shelly Bombard, MD  tinidazole (TINDAMAX) 500 MG tablet Take 2 tablets (1,000 mg total) by mouth daily with breakfast. 07/24/19   Shelly Bombard, MD    Family History Family History  Problem Relation Age of Onset   Diabetes Mother    Hypertension Mother    CAD Mother 25       Blockage in artery and stent in heart   Irregular heart beat Mother     Social History Social History   Tobacco Use   Smoking status: Never   Smokeless tobacco: Never  Vaping Use   Vaping Use: Never used  Substance Use Topics   Alcohol use: No    Alcohol/week: 0.0 standard drinks of alcohol   Drug use: No     Allergies   Fluconazole   Review of  Systems Review of Systems  Constitutional:  Positive for activity change, fatigue and fever. Negative for appetite change.  HENT:  Positive for congestion and sore throat. Negative for sinus pressure and sneezing.   Respiratory:  Positive for cough. Negative for shortness of breath.   Cardiovascular:  Negative for chest pain.  Gastrointestinal:  Positive for diarrhea, nausea and vomiting. Negative for abdominal pain.  Musculoskeletal:  Positive for arthralgias and myalgias.  Neurological:  Negative for dizziness, light-headedness and headaches.  Physical Exam Triage Vital Signs ED Triage Vitals  Enc Vitals Group     BP 01/14/22 1134 138/83     Pulse Rate 01/14/22 1134 83     Resp 01/14/22 1134 18     Temp 01/14/22 1134 98.3 F (36.8 C)     Temp Source 01/14/22 1134 Oral     SpO2 01/14/22 1134 98 %     Weight --      Height --      Head Circumference --      Peak Flow --      Pain Score 01/14/22 1135 6     Pain Loc --      Pain Edu? --      Excl. in [place]? --    No data found.  Updated Vital Signs BP 138/83 (BP Location: Right Arm)   Pulse 83   Temp 98.3 F (36.8 C) (Oral)   Resp 18   LMP 03/27/2016 Comment: negative beta HCG 03/09/18  SpO2 98%   Visual Acuity Right Eye Distance:   Left Eye Distance:   Bilateral Distance:    Right Eye Near:   Left Eye Near:    Bilateral Near:     Physical Exam Vitals reviewed.  Constitutional:      General: She is awake. She is not in acute distress.    Appearance: Normal appearance. She is well-developed. She is not ill-appearing.     Comments: Very pleasant female appears stated age in no acute distress sitting comfortably in exam room  HENT:     Head: Normocephalic and atraumatic.     Right Ear: Tympanic membrane, ear canal and external ear normal. Tympanic membrane is not erythematous or bulging.     Left Ear: Tympanic membrane, ear canal and external ear normal. Tympanic membrane is not erythematous or bulging.      Nose:     Right Sinus: No maxillary sinus tenderness or frontal sinus tenderness.     Left Sinus: No maxillary sinus tenderness or frontal sinus tenderness.     Mouth/Throat:     Pharynx: Uvula midline. Posterior oropharyngeal erythema present. No oropharyngeal exudate.  Cardiovascular:     Rate and Rhythm: Normal rate and regular rhythm.     Heart sounds: Normal heart sounds, S1 normal and S2 normal. No murmur heard. Pulmonary:     Effort: Pulmonary effort is normal.     Breath sounds: Examination of the right-lower field reveals decreased breath sounds. Examination of the left-lower field reveals decreased breath sounds. Decreased breath sounds present. No wheezing, rhonchi or rales.     Comments: Decreased aeration bilateral bases Psychiatric:        Behavior: Behavior is cooperative.      UC Treatments / Results  Labs (all labs ordered are listed, but only abnormal results are displayed) Labs Reviewed  RESP PANEL BY RT-PCR (FLU A&B, COVID) [id number]    EKG   Radiology No results found.  Procedures Procedures (including critical care time)  Medications Ordered in UC Medications  ipratropium-albuterol (DUONEB) 0.5-2.5 (3) MG/3ML nebulizer solution 3 mL (3 mLs Nebulization Given 01/14/22 1159)    Initial Impression / Assessment and Plan / UC Course  I have reviewed the triage vital signs and the nursing notes.  Pertinent labs & imaging results that were available during my care of the patient were reviewed by me and considered in my medical decision making (see chart for details).     Patient is well-appearing, afebrile, nontoxic, nontachycardic.  No evidence of acute infection on physical exam that warrant initiation of antibiotics.  Strongly suspect viral etiology.  COVID and flu testing was obtained today.  Unfortunately, she is outside the window of effectiveness for Tamiflu and for COVID antivirals.  I suspect that her virus has triggered her asthma.  She was given a  DuoNeb in clinic with improvement of symptoms.  Refill of albuterol was sent to pharmacy.  Recommended she use allergy medication including Flonase and antihistamine.  Promethazine DM was sent for cough with instruction not to drive or drink alcohol while taking this medication as drowsiness is a common side effect.  Given her uncontrolled diabetes I am hesitant to begin systemic steroids.  Will try Symbicort in the hopes that inhaled corticosteroids will help manage her symptoms without causing significant hyperglycemia.  Discussed that she will need to rinse her mouth following use of this medication to prevent thrush.  Recommended close follow-up with her primary care; she was encouraged to call and schedule appointment soon as possible.  Discussed that if her symptoms are improving within a week she should return for reevaluation.  If she has any worsening or changing symptoms including increased cough, shortness of breath, nausea/vomiting or further oral intake, high fever she needs to be seen immediately.  Strict return precautions given.  Work excuse note with current CDC return to work guidelines based on COVID test result provided during visit today.  Final Clinical Impressions(s) / UC Diagnoses   Final diagnoses:  Upper respiratory tract infection, unspecified type  Mild intermittent asthma with acute exacerbation     Discharge Instructions      We will contact you if you are positive for COVID or flu.  Please monitor your MyChart for these results.  Use albuterol every 4-6 hours as needed.  Start Symbicort twice a day.  Make sure to rinse your mouth following use of this medication to prevent thrush.  Use over-the-counter antihistamine as well as prescribed Flonase for additional symptom relief.  Take Promethazine DM for cough.  This will make you sleepy so do not drive or drink alcohol while taking it.  Ensure you rest and drink plenty of fluids.  Follow-up with your primary care soon as  possible; call them to schedule an appointment.  If anything changes or worsens and you have worsening cough, shortness of breath, nausea/vomiting interfere with oral intake, weakness you need to be seen immediately.     ED Prescriptions     Medication Sig Dispense Auth. Provider   albuterol (VENTOLIN HFA) 108 (90 Base) MCG/ACT inhaler Inhale 2 puffs into the lungs every 6 (six) hours as needed for wheezing or shortness of breath. 18 g Eugen Jeansonne K, PA-C   fluticasone (FLONASE) 50 MCG/ACT nasal spray Place 1 spray into both nostrils daily. 16 g Aretta Stetzel K, PA-C   budesonide-formoterol (SYMBICORT) 80-4.5 MCG/ACT inhaler Inhale 2 puffs into the lungs in the morning and at bedtime. 1 each Myrl Lazarus K, PA-C   promethazine-dextromethorphan (PROMETHAZINE-DM) 6.25-15 MG/5ML syrup Take 5 mLs by mouth 4 (four) times daily as needed for cough. 118 mL Massai Hankerson K, PA-C      PDMP not reviewed this encounter.   Terrilee Croak, PA-C 01/14/22 1215

## 2022-01-14 NOTE — ED Triage Notes (Signed)
Pt c/o n/v/d, congestion, body aches, headaches since Friday.

## 2022-03-03 ENCOUNTER — Ambulatory Visit (HOSPITAL_COMMUNITY)
Admission: EM | Admit: 2022-03-03 | Discharge: 2022-03-03 | Disposition: A | Discharge status: Home / Self Care | Payer: Medicaid Other | Attending: Emergency Medicine | Admitting: Emergency Medicine

## 2022-03-03 ENCOUNTER — Encounter (HOSPITAL_COMMUNITY): Payer: Self-pay

## 2022-03-03 VITALS — BP 131/89 | HR 93 | Temp 98.4°F | Resp 16

## 2022-03-03 DIAGNOSIS — J069 Acute upper respiratory infection, unspecified: Secondary | ICD-10-CM

## 2022-03-03 MED ORDER — ALBUTEROL SULFATE HFA 108 (90 BASE) MCG/ACT IN AERS: 2.0000 | INHALATION_SPRAY | Freq: Four times a day (QID) | RESPIRATORY_TRACT | 2 refills | Status: DC | PRN

## 2022-03-03 MED ORDER — FLUTICASONE PROPIONATE 50 MCG/ACT NA SUSP: 1.0000 | Freq: Every day | NASAL | 2 refills | Status: AC

## 2022-03-03 MED ORDER — GUAIFENESIN ER 600 MG PO TB12: 600.0000 mg | ORAL_TABLET | Freq: Two times a day (BID) | ORAL | 0 refills | Status: AC

## 2022-03-03 NOTE — ED Provider Notes (Signed)
[place]    CSN: [id number] Arrival date & time: 03/03/22  0849     History   Chief Complaint Chief Complaint  Patient presents with   Cough   Sore Throat    HPI Sarah Goodman is a 57 y.o. female.  Presents with 5 day history of congestion and cough Some sinus pressure No fevers. No GI symptoms Has not taken any medications for symptoms  Hx of asthma, does not have inhaler. Denies wheezing or shortness of breath  Daughter is sick with similar  Past Medical History:  Diagnosis Date   Chest pain    a. 05/2012 Cath: LM nl, LAD nl, LCX nl, RCA nl, EF 65%;  b. 02/2015 Echo: EF 60-65%, no rwma, Gr 1 DD, nl LA size; c. 02/2015 CTA chest: No PE.   Endometriosis    GERD (gastroesophageal reflux disease)    Hypertension    Migraine    Morbid obesity ([place])    Type II diabetes mellitus ([place])     Patient Active Problem List   Diagnosis Date Noted   Hypokalemia 02/11/2018   HTN (hypertension) 02/11/2018   Migraine 07/24/2015   Postmenopausal bleeding 04/02/2015   Nausea    Morbid obesity ([place])    Type II diabetes mellitus ([place])    Hive 03/18/2015   Positive D dimer 03/18/2015   Chest pressure 03/17/2015   Chest pain 03/17/2015   Chronic migraine 02/27/2015   Dysplasia of cervix, low grade (CIN 1) 08/05/2012   UTI (urinary tract infection) 05/24/2012   Dehydration 05/24/2012   GERD (gastroesophageal reflux disease) 05/24/2012   Morbid obesity with body mass index of 50.0-59.9 in adult [facility]) 05/24/2012   Vertigo 05/23/2012    Class: Acute   Chest discomfort 05/23/2012    Class: Acute   Diabetes mellitus type II, uncontrolled 05/23/2012   Abnormal uterine bleeding 12/18/2011   Low grade squamous intraepithelial lesion (LGSIL) on Pap smear 12/18/2011    Past Surgical History:  Procedure Laterality Date   CHOLECYSTECTOMY N/A 10/19/2016   Procedure: LAPAROSCOPIC CHOLECYSTECTOMY;  Surgeon: Georganna Skeans, MD;  Location: [place];  Service: General;   Laterality: N/A;   COLPOSCOPY  12/18/2011   Procedure: COLPOSCOPY;  Surgeon: Lahoma Crocker, MD;  Location: [facility];  Service: Gynecology;  Laterality: N/A;   HYSTEROSCOPY WITH D & C  12/18/2011   Procedure: DILATATION AND CURETTAGE /HYSTEROSCOPY;  Surgeon: Lahoma Crocker, MD;  Location: [facility];  Service: Gynecology;  Laterality: N/A;   LEFT HEART CATHETERIZATION WITH CORONARY ANGIOGRAM N/A 05/26/2012   Procedure: LEFT HEART CATHETERIZATION WITH CORONARY ANGIOGRAM;  Surgeon: Sinclair Grooms, MD;  Location: [facility];  Service: Cardiovascular;  Laterality: N/A;    OB History     Gravida  4   Para  4   Term  0   Preterm  0   AB  0   Living  4      SAB  0   IAB  0   Ectopic  0   Multiple      Live Births  4            Home Medications    Prior to Admission medications   Medication Sig Start Date End Date Taking? Authorizing Provider  fluticasone (FLONASE) 50 MCG/ACT nasal spray Place 1 spray into both nostrils daily. 03/03/22  Yes Miliani Deike, Wells Guiles, PA-C  furosemide (LASIX) 20 MG tablet Take 20 mg by mouth daily.   Yes [provider]  ibuprofen (  ADVIL) 800 MG tablet TAKE 1 TABLET (800 MG TOTAL) BY MOUTH EVERY 8 (EIGHT) HOURS AS NEEDED. 10/20/21  Yes Shelly Bombard, MD  LEVEMIR FLEXTOUCH 100 UNIT/ML FlexTouch Pen Inject 20 Units into the skin at bedtime. 10/22/21  Yes Francene Finders, PA-C  lisinopril (ZESTRIL) 20 MG tablet Take 1 tablet (20 mg total) by mouth daily. 07/12/20  Yes Lelon Perla, MD  loratadine (CLARITIN) 10 MG tablet Take 1 tablet (10 mg total) by mouth daily. 04/03/21  Yes Shelly Bombard, MD  pantoprazole (PROTONIX) 40 MG tablet Take 40 mg by mouth daily. 03/11/18  Yes [provider]  albuterol (VENTOLIN HFA) 108 (90 Base) MCG/ACT inhaler Inhale 2 puffs into the lungs every 6 (six) hours as needed for wheezing or shortness of breath. 03/03/22  Yes Hollie Wojahn, Wells Guiles, PA-C  guaiFENesin (MUCINEX) 600 MG 12 hr tablet Take 1  tablet (600 mg total) by mouth 2 (two) times daily for 5 days. 03/03/22 03/08/22 Yes Katiana Ruland, Wells Guiles, PA-C    Family History Family History  Problem Relation Age of Onset   Diabetes Mother    Hypertension Mother    CAD Mother 44       Blockage in artery and stent in heart   Irregular heart beat Mother     Social History Social History   Tobacco Use   Smoking status: Never   Smokeless tobacco: Never  Vaping Use   Vaping Use: Never used  Substance Use Topics   Alcohol use: No    Alcohol/week: 0.0 standard drinks of alcohol   Drug use: No     Allergies   Fluconazole   Review of Systems Review of Systems As per HPI  Physical Exam  Updated Vital Signs BP 131/89 (BP Location: Left Arm)   Pulse 93   Temp 98.4 F (36.9 C) (Oral)   Resp 16   LMP 03/27/2016 Comment: negative beta HCG 03/09/18  SpO2 96%    Physical Exam Vitals and nursing note reviewed.  HENT:     Right Ear: Tympanic membrane and ear canal normal.     Left Ear: Tympanic membrane and ear canal normal.     Nose: Congestion present. No rhinorrhea.     Right Turbinates: Swollen.     Left Turbinates: Swollen.     Mouth/Throat:     Mouth: Mucous membranes are moist.     Pharynx: Oropharynx is clear. No posterior oropharyngeal erythema.  Eyes:     Conjunctiva/sclera: Conjunctivae normal.  Cardiovascular:     Rate and Rhythm: Normal rate and regular rhythm.     Pulses: Normal pulses.     Heart sounds: Normal heart sounds.  Pulmonary:     Effort: Pulmonary effort is normal.     Breath sounds: Normal breath sounds.  Musculoskeletal:     Cervical back: Normal range of motion.  Skin:    General: Skin is warm and dry.  Neurological:     Mental Status: She is alert and oriented to person, place, and time.      UC Treatments / Results  Labs (all labs ordered are listed, but only abnormal results are displayed) Labs Reviewed - No data to display  EKG  Radiology No results  found.  Procedures Procedures (including critical care time)  Medications Ordered in UC Medications - No data to display  Initial Impression / Assessment and Plan / UC Course  I have reviewed the triage vital signs and the nursing notes.  Pertinent labs & imaging  results that were available during my care of the patient were reviewed by me and considered in my medical decision making (see chart for details).  Viral URI with cough Discussed Mucinex twice daily with lots of fluids to reduce congestion and cough.  Recommend once daily Flonase.  Refilled albuterol inhaler, recommend to use every 6 hours over the next few days to help break up phlegm. Patient understands cough may linger for several weeks but should improve. Return precautions discussed. Patient agrees to plan  Final Clinical Impressions(s) / UC Diagnoses   Final diagnoses:  Viral URI with cough     Discharge Instructions      I recommend daily flonase in combination with twice daily mucinex. Take with lots of fluids to reduce congestion and cough.  Tylenol or ibuprofen as needed.  I recommend using the albuterol inhaler every 6 hours (3 times daily) for the next 3 to 4 days.  Then continue as needed.     ED Prescriptions     Medication Sig Dispense Auth. Provider   albuterol (VENTOLIN HFA) 108 (90 Base) MCG/ACT inhaler Inhale 2 puffs into the lungs every 6 (six) hours as needed for wheezing or shortness of breath. 18 g Kennia Vanvorst, PA-C   guaiFENesin (MUCINEX) 600 MG 12 hr tablet Take 1 tablet (600 mg total) by mouth 2 (two) times daily for 5 days. 10 tablet Eilis Chestnutt, PA-C   fluticasone (FLONASE) 50 MCG/ACT nasal spray Place 1 spray into both nostrils daily. 9.9 mL Santi Troung, Wells Guiles, PA-C      PDMP not reviewed this encounter.   [place], [place] 03/03/22 1014

## 2022-03-03 NOTE — Discharge Instructions (Addendum)
I recommend daily flonase in combination with twice daily mucinex. Take with lots of fluids to reduce congestion and cough.  Tylenol or ibuprofen as needed.  I recommend using the albuterol inhaler every 6 hours (3 times daily) for the next 3 to 4 days.  Then continue as needed.

## 2022-03-03 NOTE — ED Notes (Signed)
Vitals taken ata 0928 were placed on the wrong patient.

## 2022-03-03 NOTE — ED Triage Notes (Signed)
Pt is here for sore throat, nasal congestion, cough, headache x 1wk

## 2022-04-21 ENCOUNTER — Ambulatory Visit: Payer: Medicaid Other | Admitting: Obstetrics

## 2023-12-09 ENCOUNTER — Ambulatory Visit (HOSPITAL_COMMUNITY)
Admission: EM | Admit: 2023-12-09 | Discharge: 2023-12-09 | Disposition: A | Discharge status: Home / Self Care | Payer: Self-pay

## 2023-12-09 ENCOUNTER — Encounter (HOSPITAL_COMMUNITY): Payer: Self-pay | Admitting: Emergency Medicine

## 2023-12-09 ENCOUNTER — Ambulatory Visit (INDEPENDENT_AMBULATORY_CARE_PROVIDER_SITE_OTHER): Payer: Self-pay

## 2023-12-09 DIAGNOSIS — R051 Acute cough: Secondary | ICD-10-CM

## 2023-12-09 DIAGNOSIS — J4520 Mild intermittent asthma, uncomplicated: Secondary | ICD-10-CM

## 2023-12-09 DIAGNOSIS — J209 Acute bronchitis, unspecified: Secondary | ICD-10-CM

## 2023-12-09 MED ORDER — ALBUTEROL SULFATE HFA 108 (90 BASE) MCG/ACT IN AERS
1.0000 | INHALATION_SPRAY | Freq: Four times a day (QID) | RESPIRATORY_TRACT | 0 refills | Status: AC | PRN
Start: 2023-12-09 — End: ?

## 2023-12-09 MED ORDER — AZITHROMYCIN 250 MG PO TABS: ORAL_TABLET | ORAL | 0 refills | Status: DC

## 2023-12-09 MED ORDER — PROMETHAZINE-DM 6.25-15 MG/5ML PO SYRP
5.0000 mL | ORAL_SOLUTION | Freq: Every evening | ORAL | 0 refills | Status: AC | PRN
Start: 2023-12-09 — End: ?

## 2023-12-09 MED ORDER — BENZONATATE 100 MG PO CAPS: 100.0000 mg | ORAL_CAPSULE | Freq: Three times a day (TID) | ORAL | 0 refills | Status: DC

## 2023-12-09 MED ORDER — PREDNISONE 20 MG PO TABS: 40.0000 mg | ORAL_TABLET | Freq: Every day | ORAL | 0 refills | Status: AC

## 2023-12-09 NOTE — ED Triage Notes (Signed)
 Pt been coughing over 3 weeks that is productive at times. Using inhaler and drinking hot tea. Pt reports that cough has her urinating on herself.

## 2023-12-09 NOTE — Discharge Instructions (Addendum)
 Your x-ray is negative for any underlying pneumonia.  I believe your symptoms are likely related to a bronchitis.  Start taking azithromycin by taking 2 tablets today and 1 tablet on the remaining 4 days.   Take prednisone once daily for 5 days.   You can take Tessalon every 8 hours as needed for cough.  I have also prescribed promethazine  DM cough syrup that you can take at night for cough.  This can make you drowsy so do not take while driving.   I have refilled an albuterol  inhaler that you can use every 6 hours as needed for wheezing or shortness of breath. Otherwise alternate between Tylenol  and ibuprofen  as needed for pain and fever.  I also recommend taking over-the-counter Mucinex  to help with cough and congestion.   Return here if symptoms persist or worsen.

## 2023-12-09 NOTE — ED Provider Notes (Signed)
 [facility]    CSN: [id number] Arrival date & time: 12/09/23  0831      History   Chief Complaint Chief Complaint  Patient presents with   Cough    HPI Sarah Goodman is a 58 y.o. female.   Patient presents with persistent productive cough and chest congestion for about 3 weeks.  Patient states that her cough keeps her up at night and she is now starting to have chest pain with her cough.  Patient reports that at times her cough is uncontrollable and causes her to have urinary incontinence.  Patient states that she has had some intermittent chills as well.   Patient denies persistent chest pain, fevers, body aches, and weakness. Patient reports that she has a history of asthma and has been using her inhaler with minimal relief.  Patient states that she has also been drinking hot tea without relief.  Denies any known sick exposures.    The history is provided by the patient and medical records.  Cough   Past Medical History:  Diagnosis Date   Chest pain    a. 05/2012 Cath: LM nl, LAD nl, LCX nl, RCA nl, EF 65%;  b. 02/2015 Echo: EF 60-65%, no rwma, Gr 1 DD, nl LA size; c. 02/2015 CTA chest: No PE.   Endometriosis    GERD (gastroesophageal reflux disease)    Hypertension    Migraine    Morbid obesity ([facility])    Type II diabetes mellitus ([facility])     Patient Active Problem List   Diagnosis Date Noted   Hypokalemia 02/11/2018   HTN (hypertension) 02/11/2018   Migraine 07/24/2015   Postmenopausal bleeding 04/02/2015   Nausea    Morbid obesity ([facility])    Type II diabetes mellitus ([facility])    Hive 03/18/2015   Positive D dimer 03/18/2015   Chest pressure 03/17/2015   Chest pain 03/17/2015   Chronic migraine 02/27/2015   Dysplasia of cervix, low grade (CIN 1) 08/05/2012   UTI (urinary tract infection) 05/24/2012   Dehydration 05/24/2012   GERD (gastroesophageal reflux disease) 05/24/2012   Morbid obesity with body mass index of 50.0-59.9 in adult [facility]) 05/24/2012    Vertigo 05/23/2012    Class: Acute   Chest discomfort 05/23/2012    Class: Acute   Diabetes mellitus type II, uncontrolled 05/23/2012   Abnormal uterine bleeding 12/18/2011   Low grade squamous intraepithelial lesion (LGSIL) on Pap smear 12/18/2011    Past Surgical History:  Procedure Laterality Date   CHOLECYSTECTOMY N/A 10/19/2016   Procedure: LAPAROSCOPIC CHOLECYSTECTOMY;  Surgeon: Sebastian Moles, MD;  Location: [facility];  Service: General;  Laterality: N/A;   COLPOSCOPY  12/18/2011   Procedure: COLPOSCOPY;  Surgeon: Olam Mill, MD;  Location: [facility];  Service: Gynecology;  Laterality: N/A;   HYSTEROSCOPY WITH D & C  12/18/2011   Procedure: DILATATION AND CURETTAGE /HYSTEROSCOPY;  Surgeon: Olam Mill, MD;  Location: [facility];  Service: Gynecology;  Laterality: N/A;   LEFT HEART CATHETERIZATION WITH CORONARY ANGIOGRAM N/A 05/26/2012   Procedure: LEFT HEART CATHETERIZATION WITH CORONARY ANGIOGRAM;  Surgeon: Victory LELON Claudene DOUGLAS, MD;  Location: [facility] C[facility];  Service: Cardiovascular;  Laterality: N/A;    OB History     Gravida  4   Para  4   Term  0   Preterm  0   AB  0   Living  4      SAB  0   IAB  0   Ectopic  0   Multiple      Live Births  4            Home Medications    Prior to Admission medications   Medication Sig Start Date End Date Taking? Authorizing Provider  albuterol  (VENTOLIN  HFA) 108 (90 Base) MCG/ACT inhaler Inhale 1-2 puffs into the lungs every 6 (six) hours as needed for wheezing or shortness of breath. 12/09/23  Yes Orval Dortch A, NP  atorvastatin (LIPITOR) 40 MG tablet Take 40 mg by mouth daily. 11/10/23  Yes [provider]  azithromycin (ZITHROMAX Z-PAK) 250 MG tablet Take 2 pills (500mg ) first day and one pill (250mg ) the remaining 4 days. 12/09/23  Yes Johnie, Kaemon Barnett A, NP  benzonatate (TESSALON) 100 MG capsule Take 1 capsule (100 mg total) by mouth every 8 (eight) hours. 12/09/23  Yes Nizar Cutler A, NP   HUMALOG KWIKPEN 200 UNIT/ML KwikPen SMARTSIG:6 Unit(s) SUB-Q 3 Times Daily 11/10/23  Yes [provider]  lisinopril -hydrochlorothiazide (ZESTORETIC) 10-12.5 MG tablet Take 1 tablet by mouth daily. 11/10/23  Yes [provider]  predniSONE (DELTASONE) 20 MG tablet Take 2 tablets (40 mg total) by mouth daily for 5 days. 12/09/23 12/14/23 Yes Johnie, Daleyssa Loiselle A, NP  promethazine -dextromethorphan (PROMETHAZINE -DM) 6.25-15 MG/5ML syrup Take 5 mLs by mouth at bedtime as needed for cough. 12/09/23  Yes Jazz Biddy A, NP  fluticasone  (FLONASE ) 50 MCG/ACT nasal spray Place 1 spray into both nostrils daily. 03/03/22   Rising, [place], PA-C  furosemide (LASIX) 20 MG tablet Take 20 mg by mouth daily.    [provider]  ibuprofen  (ADVIL ) 800 MG tablet TAKE 1 TABLET (800 MG TOTAL) BY MOUTH EVERY 8 (EIGHT) HOURS AS NEEDED. 10/20/21   Rudy Carlin LABOR, MD  LEVEMIR  FLEXTOUCH 100 UNIT/ML FlexTouch Pen Inject 20 Units into the skin at bedtime. 10/22/21   Billy Asberry FALCON, PA-C  lisinopril  (ZESTRIL ) 20 MG tablet Take 1 tablet (20 mg total) by mouth daily. 07/12/20   Pietro Redell RAMAN, MD  loratadine  (CLARITIN ) 10 MG tablet Take 1 tablet (10 mg total) by mouth daily. 04/03/21   Rudy Carlin LABOR, MD  pantoprazole  (PROTONIX ) 40 MG tablet Take 40 mg by mouth daily. 03/11/18   [provider]    Family History Family History  Problem Relation Age of Onset   Diabetes Mother    Hypertension Mother    CAD Mother 42       Blockage in artery and stent in heart   Irregular heart beat Mother     Social History Social History   Tobacco Use   Smoking status: Never   Smokeless tobacco: Never  Vaping Use   Vaping status: Never Used  Substance Use Topics   Alcohol use: No    Alcohol/week: 0.0 standard drinks of alcohol   Drug use: No     Allergies   Fluconazole    Review of Systems Review of Systems  Respiratory:  Positive for cough.    Per HPI  Physical Exam Triage  Vital Signs ED Triage Vitals  Encounter Vitals Group     BP 12/09/23 0850 (!) 148/90     Girls Systolic BP Percentile --      Girls Diastolic BP Percentile --      Boys Systolic BP Percentile --      Boys Diastolic BP Percentile --      Pulse Rate 12/09/23 0850 84     Resp 12/09/23 0850 19     Temp 12/09/23 0850 97.9  F (36.6 C)     Temp Source 12/09/23 0850 Oral     SpO2 12/09/23 0850 93 %     Weight --      Height --      Head Circumference --      Peak Flow --      Pain Score 12/09/23 0849 0     Pain Loc --      Pain Education --      Exclude from Growth Chart --    No data found.  Updated Vital Signs BP (!) 148/90 (BP Location: Right Arm)   Pulse 84   Temp 97.9 F (36.6 C) (Oral)   Resp 19   LMP 03/27/2016 Comment: negative beta HCG 03/09/18  SpO2 93%   Visual Acuity Right Eye Distance:   Left Eye Distance:   Bilateral Distance:    Right Eye Near:   Left Eye Near:    Bilateral Near:     Physical Exam Vitals and nursing note reviewed.  Constitutional:      General: She is awake. She is not in acute distress.    Appearance: Normal appearance. She is well-developed and well-groomed. She is not ill-appearing.  HENT:     Right Ear: Tympanic membrane, ear canal and external ear normal.     Left Ear: Tympanic membrane, ear canal and external ear normal.     Nose: Congestion and rhinorrhea present.     Mouth/Throat:     Mouth: Mucous membranes are moist.     Pharynx: Posterior oropharyngeal erythema and postnasal drip present. No oropharyngeal exudate.  Cardiovascular:     Rate and Rhythm: Normal rate and regular rhythm.  Pulmonary:     Effort: Pulmonary effort is normal.     Breath sounds: Normal breath sounds.     Comments: Strong persistent productive cough noted on exam Skin:    General: Skin is warm and dry.  Neurological:     Mental Status: She is alert.  Psychiatric:        Behavior: Behavior is cooperative.      UC Treatments / Results   Labs (all labs ordered are listed, but only abnormal results are displayed) Labs Reviewed - No data to display  EKG   Radiology DG Chest 2 View Result Date: 12/09/2023 CLINICAL DATA:  Cough and chest pain 3 weeks. EXAM: CHEST - 2 VIEW COMPARISON:  08/18/2021 FINDINGS: Lungs are adequately inflated without lobar consolidation or effusion. Mild stable prominence of the central pulmonary vessels. Cardiomediastinal silhouette and remainder the exam is unchanged. IMPRESSION: No active cardiopulmonary disease. Electronically Signed   By: Toribio Agreste M.D.   On: 12/09/2023 10:13    Procedures Procedures (including critical care time)  Medications Ordered in UC Medications - No data to display  Initial Impression / Assessment and Plan / UC Course  I have reviewed the triage vital signs and the nursing notes.  Pertinent labs & imaging results that were available during my care of the patient were reviewed by me and considered in my medical decision making (see chart for details).     Patient is overall well-appearing.  Vitals are stable.  Congestion and rhinorrhea present, erythema and PND noted to posterior oropharynx.  Strong persistent productive cough noted on exam as well.  Lung clear bilaterally to auscultation.  Chest x-ray ordered to rule out underlying pneumonia.  I independently interpreted these images and there is no active cardiopulmonary disease.  Symptoms likely related to underlying bronchitis that is  exacerbated asthma.  Prescribed Z-Pak and prednisone burst.  Prescribed Tessalon and Promethazine  DM as needed for cough.  Refilled albuterol  inhaler if needed.  Discussed follow-up and return precautions. Final Clinical Impressions(s) / UC Diagnoses   Final diagnoses:  Acute cough  Acute bronchitis, unspecified organism  Mild intermittent asthma, unspecified whether complicated     Discharge Instructions      Your x-ray is negative for any underlying pneumonia.  I  believe your symptoms are likely related to a bronchitis.  Start taking azithromycin by taking 2 tablets today and 1 tablet on the remaining 4 days.   Take prednisone once daily for 5 days.   You can take Tessalon every 8 hours as needed for cough.  I have also prescribed promethazine  DM cough syrup that you can take at night for cough.  This can make you drowsy so do not take while driving.   I have refilled an albuterol  inhaler that you can use every 6 hours as needed for wheezing or shortness of breath. Otherwise alternate between Tylenol  and ibuprofen  as needed for pain and fever.  I also recommend taking over-the-counter Mucinex  to help with cough and congestion.   Return here if symptoms persist or worsen.      ED Prescriptions     Medication Sig Dispense Auth. Provider   benzonatate (TESSALON) 100 MG capsule Take 1 capsule (100 mg total) by mouth every 8 (eight) hours. 21 capsule Johnie Flaming A, NP   promethazine -dextromethorphan (PROMETHAZINE -DM) 6.25-15 MG/5ML syrup Take 5 mLs by mouth at bedtime as needed for cough. 118 mL Johnie Flaming A, NP   albuterol  (VENTOLIN  HFA) 108 (90 Base) MCG/ACT inhaler Inhale 1-2 puffs into the lungs every 6 (six) hours as needed for wheezing or shortness of breath. 18 g Johnie Flaming A, NP   predniSONE (DELTASONE) 20 MG tablet Take 2 tablets (40 mg total) by mouth daily for 5 days. 10 tablet Johnie Flaming A, NP   azithromycin (ZITHROMAX Z-PAK) 250 MG tablet Take 2 pills (500mg ) first day and one pill (250mg ) the remaining 4 days. 6 tablet Johnie Flaming A, NP      PDMP not reviewed this encounter.   Johnie Flaming A, NP 12/09/23 1024

## 2024-01-20 ENCOUNTER — Encounter (HOSPITAL_COMMUNITY): Payer: Self-pay

## 2024-01-20 ENCOUNTER — Ambulatory Visit (HOSPITAL_COMMUNITY)
Admission: EM | Admit: 2024-01-20 | Discharge: 2024-01-20 | Disposition: A | Discharge status: Home / Self Care | Payer: Self-pay | Attending: Family Medicine | Admitting: Family Medicine

## 2024-01-20 ENCOUNTER — Ambulatory Visit (HOSPITAL_COMMUNITY): Payer: Self-pay

## 2024-01-20 DIAGNOSIS — K219 Gastro-esophageal reflux disease without esophagitis: Secondary | ICD-10-CM

## 2024-01-20 DIAGNOSIS — E119 Type 2 diabetes mellitus without complications: Secondary | ICD-10-CM

## 2024-01-20 DIAGNOSIS — Z794 Long term (current) use of insulin: Secondary | ICD-10-CM

## 2024-01-20 MED ORDER — INSULIN SYRINGE 31G X 5/16" 1 ML MISC: 1 refills | Status: AC

## 2024-01-20 MED ORDER — OMEPRAZOLE 40 MG PO CPDR: 40.0000 mg | DELAYED_RELEASE_CAPSULE | Freq: Two times a day (BID) | ORAL | 2 refills | Status: AC

## 2024-01-20 MED ORDER — INSULIN GLARGINE 100 UNIT/ML SOLOSTAR PEN: 32.0000 [IU] | PEN_INJECTOR | Freq: Every day | SUBCUTANEOUS | 2 refills | Status: DC

## 2024-01-20 MED ORDER — INSULIN LISPRO 100 UNIT/ML IJ SOLN: 8.0000 [IU] | Freq: Two times a day (BID) | INTRAMUSCULAR | 2 refills | Status: AC

## 2024-01-20 MED ORDER — HUMALOG KWIKPEN 200 UNIT/ML ~~LOC~~ SOPN: 8.0000 [IU] | PEN_INJECTOR | Freq: Two times a day (BID) | SUBCUTANEOUS | 2 refills | Status: DC

## 2024-01-20 MED ORDER — BD PEN NEEDLE SHORT ULTRAFINE 31G X 8 MM MISC: 2 refills | Status: DC

## 2024-01-20 MED ORDER — BD PEN NEEDLE SHORT ULTRAFINE 31G X 8 MM MISC: 2 refills | Status: AC

## 2024-01-20 NOTE — Discharge Instructions (Signed)
 Continue Lantus  32 units once daily  Use the Humalog in the vial due to cost--8 units 2 times daily before meals  Continue omeprazole  40 mg-once daily-

## 2024-01-20 NOTE — ED Provider Notes (Signed)
 [facility]    CSN: [id number] Arrival date & time: 01/20/24  1252      History   Chief Complaint Chief Complaint  Patient presents with   Medication Refill    HPI Sarah Goodman is a 58 y.o. female.    Medication Refill  Here for diabetes and for acid reflux.  She has been taking Levemir  32 units once daily and NovoLog  8 units 2 times daily.  Sugars have been pretty good when she is taking her medicines, but she just ran out about 3 days ago.  She does not feel ill and has not had any fever or cough or shortness of breath or swelling.  No nausea or vomiting  She just moved here recently from [place]  and is working on getting [place]  Medicaid.  She also does not have a new primary care appointment yet  She takes omeprazole  twice daily for acid reflux and that usually helps very well.  She does have hypertension but does not need new prescriptions on those yet  She is allergic to fluconazole   Last menstrual cycle was about 7 years ago   Past Medical History:  Diagnosis Date   Chest pain    a. 05/2012 Cath: LM nl, LAD nl, LCX nl, RCA nl, EF 65%;  b. 02/2015 Echo: EF 60-65%, no rwma, Gr 1 DD, nl LA size; c. 02/2015 CTA chest: No PE.   Endometriosis    GERD (gastroesophageal reflux disease)    Hypertension    Migraine    Morbid obesity (HCC)    Type II diabetes mellitus (HCC)     Patient Active Problem List   Diagnosis Date Noted   Hypokalemia 02/11/2018   HTN (hypertension) 02/11/2018   Migraine 07/24/2015   Postmenopausal bleeding 04/02/2015   Nausea    Morbid obesity (HCC)    Type II diabetes mellitus (HCC)    Hive 03/18/2015   Positive D dimer 03/18/2015   Chest pressure 03/17/2015   Chest pain 03/17/2015   Chronic migraine 02/27/2015   Dysplasia of cervix, low grade (CIN 1) 08/05/2012   UTI (urinary tract infection) 05/24/2012   Dehydration 05/24/2012   GERD (gastroesophageal reflux disease) 05/24/2012   Morbid obesity with  body mass index of 50.0-59.9 in adult [facility]) 05/24/2012   Vertigo 05/23/2012    Class: Acute   Chest discomfort 05/23/2012    Class: Acute   Diabetes mellitus type II, uncontrolled 05/23/2012   Abnormal uterine bleeding 12/18/2011   Low grade squamous intraepithelial lesion (LGSIL) on Pap smear 12/18/2011    Past Surgical History:  Procedure Laterality Date   CHOLECYSTECTOMY N/A 10/19/2016   Procedure: LAPAROSCOPIC CHOLECYSTECTOMY;  Surgeon: Sebastian Moles, MD;  Location: [facility] OR;  Service: General;  Laterality: N/A;   COLPOSCOPY  12/18/2011   Procedure: COLPOSCOPY;  Surgeon: Olam Mill, MD;  Location: [facility] ORS;  Service: Gynecology;  Laterality: N/A;   HYSTEROSCOPY WITH D & C  12/18/2011   Procedure: DILATATION AND CURETTAGE /HYSTEROSCOPY;  Surgeon: Olam Mill, MD;  Location: [facility] ORS;  Service: Gynecology;  Laterality: N/A;   LEFT HEART CATHETERIZATION WITH CORONARY ANGIOGRAM N/A 05/26/2012   Procedure: LEFT HEART CATHETERIZATION WITH CORONARY ANGIOGRAM;  Surgeon: Victory LELON Claudene DOUGLAS, MD;  Location: [facility] CATH LAB;  Service: Cardiovascular;  Laterality: N/A;    OB History     Gravida  4   Para  4   Term  0   Preterm  0   AB  0   Living  4      SAB  0   IAB  0   Ectopic  0   Multiple      Live Births  4            Home Medications    Prior to Admission medications  Medication Sig Start Date End Date Taking? Authorizing Provider  insulin  glargine (LANTUS ) 100 UNIT/ML Solostar Pen Inject 32 Units into the skin daily. 01/20/24  Yes Vonna Sharlet POUR, MD  insulin  lispro (HUMALOG) 100 UNIT/ML injection Inject 0.08 mLs (8 Units total) into the skin 2 (two) times daily before a meal. 01/20/24  Yes Lucylle Foulkes, Sharlet POUR, MD  Insulin  Syringe-Needle U-100 (INSULIN  SYRINGE 1CC/31GX5/16) 31G X 5/16 1 ML MISC Injects insulin  from a vial 2 times daily. 01/20/24  Yes Vonna Sharlet POUR, MD  omeprazole  (PRILOSEC) 40 MG capsule Take 1 capsule (40 mg total) by mouth in  the morning and at bedtime. 01/20/24  Yes Vonna Sharlet POUR, MD  albuterol  (VENTOLIN  HFA) 108 (90 Base) MCG/ACT inhaler Inhale 1-2 puffs into the lungs every 6 (six) hours as needed for wheezing or shortness of breath. 12/09/23   Johnie Flaming A, NP  atorvastatin (LIPITOR) 40 MG tablet Take 40 mg by mouth daily. 11/10/23   [provider]  fluticasone  (FLONASE ) 50 MCG/ACT nasal spray Place 1 spray into both nostrils daily. 03/03/22   Rising, Asberry, PA-C  furosemide (LASIX) 20 MG tablet Take 20 mg by mouth daily.    [provider]  Insulin  Pen Needle (BD PEN NEEDLE SHORT ULTRAFINE) 31G X 8 MM MISC 1 injection only from pen; will be doing 2 injections daily from vial 01/20/24   Airyonna Franklyn K, MD  lisinopril  (ZESTRIL ) 20 MG tablet Take 1 tablet (20 mg total) by mouth daily. 07/12/20   Pietro Redell RAMAN, MD  lisinopril -hydrochlorothiazide (ZESTORETIC) 10-12.5 MG tablet Take 1 tablet by mouth daily. 11/10/23   [provider]  loratadine  (CLARITIN ) 10 MG tablet Take 1 tablet (10 mg total) by mouth daily. 04/03/21   Rudy Carlin LABOR, MD    Family History Family History  Problem Relation Age of Onset   Diabetes Mother    Hypertension Mother    CAD Mother 68       Blockage in artery and stent in heart   Irregular heart beat Mother     Social History Social History[1]   Allergies   Fluconazole    Review of Systems Review of Systems   Physical Exam Triage Vital Signs ED Triage Vitals [01/20/24 1305]  Encounter Vitals Group     BP (!) 157/92     Girls Systolic BP Percentile      Girls Diastolic BP Percentile      Boys Systolic BP Percentile      Boys Diastolic BP Percentile      Pulse Rate 92     Resp 17     Temp 98.2 F (36.8 C)     Temp Source Oral     SpO2 94 %     Weight      Height      Head Circumference      Peak Flow      Pain Score      Pain Loc      Pain Education      Exclude from Growth Chart    No data found.  Updated Vital  Signs BP (!) 157/92 (BP Location: Right Arm)   Pulse 92   Temp 98.2  F (36.8 C) (Oral)   Resp 17   LMP 03/27/2016 Comment: negative beta HCG 03/09/18  SpO2 94%   Visual Acuity Right Eye Distance:   Left Eye Distance:   Bilateral Distance:    Right Eye Near:   Left Eye Near:    Bilateral Near:     Physical Exam Vitals reviewed.  Constitutional:      General: She is not in acute distress.    Appearance: She is not ill-appearing, toxic-appearing or diaphoretic.  HENT:     Mouth/Throat:     Mouth: Mucous membranes are moist.  Eyes:     Extraocular Movements: Extraocular movements intact.     Pupils: Pupils are equal, round, and reactive to light.  Cardiovascular:     Rate and Rhythm: Normal rate and regular rhythm.     Heart sounds: No murmur heard. Pulmonary:     Effort: Pulmonary effort is normal.     Breath sounds: Normal breath sounds.  Musculoskeletal:     Cervical back: Neck supple.  Lymphadenopathy:     Cervical: No cervical adenopathy.  Skin:    Coloration: Skin is not jaundiced or pale.  Neurological:     General: No focal deficit present.     Mental Status: She is alert and oriented to person, place, and time.  Psychiatric:        Behavior: Behavior normal.      UC Treatments / Results  Labs (all labs ordered are listed, but only abnormal results are displayed) Labs Reviewed - No data to display  EKG   Radiology No results found.  Procedures Procedures (including critical care time)  Medications Ordered in UC Medications - No data to display  Initial Impression / Assessment and Plan / UC Course  I have reviewed the triage vital signs and the nursing notes.  Pertinent labs & imaging results that were available during my care of the patient were reviewed by me and considered in my medical decision making (see chart for details).     She states she just had labs about 2 months ago.  Lantus  prescription is sent and a good Rx coupon is  provided that we will make that very affordable in the pen form.  Her Humalog and pen form will not be affordable even with GoodRx, so a vial of that is sent in along with insulin  syringes and pen needles for her Lantus  prescription.  Staff will show her what her insulin  syringe looks like at discharge.  Also they will help her set up a PCP appointment  She is applying for [place]  Medicaid; prescriptions for Lantus  and Humalog were done as those are covered better under   Medicaid than the Novo Nordisk products. Final Clinical Impressions(s) / UC Diagnoses   Final diagnoses:  Type 2 diabetes mellitus without complication, with long-term current use of insulin  (HCC)  Gastroesophageal reflux disease without esophagitis     Discharge Instructions      Continue Lantus  32 units once daily  Use the Humalog in the vial due to cost--8 units 2 times daily before meals  Continue omeprazole  40 mg-once daily-    ED Prescriptions     Medication Sig Dispense Auth. Provider   HUMALOG KWIKPEN 200 UNIT/ML KwikPen  (Status: Discontinued) Inject 8 Units into the skin in the morning and at bedtime. 15 mL Vonna Sharlet POUR, MD   insulin  glargine (LANTUS ) 100 UNIT/ML Solostar Pen Inject 32 Units into the skin daily. 15 mL Vonna Sharlet POUR,  MD   Insulin  Pen Needle (BD PEN NEEDLE SHORT ULTRAFINE) 31G X 8 MM MISC  (Status: Discontinued) 3 injections total daily of her insulin  100 each Conchita Truxillo, Sharlet POUR, MD   omeprazole  (PRILOSEC) 40 MG capsule Take 1 capsule (40 mg total) by mouth in the morning and at bedtime. 60 capsule Nazeer Romney, Sharlet POUR, MD   insulin  lispro (HUMALOG) 100 UNIT/ML injection Inject 0.08 mLs (8 Units total) into the skin 2 (two) times daily before a meal. 10 mL Jakori Burkett, Sharlet POUR, MD   Insulin  Syringe-Needle U-100 (INSULIN  SYRINGE 1CC/31GX5/16) 31G X 5/16 1 ML MISC Injects insulin  from a vial 2 times daily. 100 each Vonna Sharlet POUR, MD   Insulin  Pen Needle  (BD PEN NEEDLE SHORT ULTRAFINE) 31G X 8 MM MISC 1 injection only from pen; will be doing 2 injections daily from vial 100 each Manuel Lawhead K, MD      PDMP not reviewed this encounter.    [1]  Social History Tobacco Use   Smoking status: Never   Smokeless tobacco: Never  Vaping Use   Vaping status: Never Used  Substance Use Topics   Alcohol use: No    Alcohol/week: 0.0 standard drinks of alcohol   Drug use: No     Vonna Sharlet POUR, MD 01/20/24 [phone]

## 2024-01-20 NOTE — ED Triage Notes (Signed)
 Pt coming in today for medication refill, insulin  :Humalog, and Lantus  and Omeprazole . Pt ran out of medication on Monday.

## 2024-01-23 ENCOUNTER — Other Ambulatory Visit (HOSPITAL_COMMUNITY): Payer: Self-pay

## 2024-01-23 ENCOUNTER — Other Ambulatory Visit: Payer: Self-pay

## 2024-01-23 ENCOUNTER — Emergency Department (HOSPITAL_BASED_OUTPATIENT_CLINIC_OR_DEPARTMENT_OTHER): Payer: Self-pay

## 2024-01-23 ENCOUNTER — Emergency Department (HOSPITAL_BASED_OUTPATIENT_CLINIC_OR_DEPARTMENT_OTHER)
Admission: EM | Admit: 2024-01-23 | Discharge: 2024-01-23 | Disposition: A | Discharge status: Home / Self Care | Payer: Self-pay | Attending: Emergency Medicine | Admitting: Emergency Medicine

## 2024-01-23 ENCOUNTER — Encounter (HOSPITAL_BASED_OUTPATIENT_CLINIC_OR_DEPARTMENT_OTHER): Payer: Self-pay | Admitting: Emergency Medicine

## 2024-01-23 DIAGNOSIS — R739 Hyperglycemia, unspecified: Secondary | ICD-10-CM | POA: Diagnosis present

## 2024-01-23 LAB — COMPREHENSIVE METABOLIC PANEL WITH GFR
ALT: 18 U/L (ref 0–44)
AST: 18 U/L (ref 15–41)
Albumin: 3.7 g/dL (ref 3.5–5.0)
Alkaline Phosphatase: 77 U/L (ref 38–126)
Anion gap: 12 (ref 5–15)
BUN: 12 mg/dL (ref 6–20)
CO2: 25 mmol/L (ref 22–32)
Calcium: 10 mg/dL (ref 8.9–10.3)
Chloride: 93 mmol/L — ABNORMAL LOW (ref 98–111)
Creatinine, Ser: 0.73 mg/dL (ref 0.44–1.00)
GFR, Estimated: >60 mL/min (ref >60)
Glucose, Bld: 414 mg/dL — ABNORMAL HIGH (ref 70–99)
Potassium: 4.3 mmol/L (ref 3.5–5.1)
Sodium: 129 mmol/L — ABNORMAL LOW (ref 135–145)
Total Bilirubin: 0.7 mg/dL (ref 0.0–1.2)
Total Protein: 8 g/dL (ref 6.5–8.1)

## 2024-01-23 LAB — CBC WITH DIFFERENTIAL/PLATELET
Abs Immature Granulocytes: 0.03 K/uL (ref 0.00–0.07)
Basophils Absolute: 0.1 K/uL (ref 0.0–0.1)
Basophils Relative: 1 %
Eosinophils Absolute: 0.2 K/uL (ref 0.0–0.5)
Eosinophils Relative: 3 %
HCT: 38.4 % (ref 36.0–46.0)
Hemoglobin: 13.4 g/dL (ref 12.0–15.0)
Immature Granulocytes: 1 %
Lymphocytes Relative: 38 %
Lymphs Abs: 2.5 K/uL (ref 0.7–4.0)
MCH: 29.7 pg (ref 26.0–34.0)
MCHC: 34.9 g/dL (ref 30.0–36.0)
MCV: 85.1 fL (ref 80.0–100.0)
Monocytes Absolute: 0.5 K/uL (ref 0.1–1.0)
Monocytes Relative: 8 %
Neutro Abs: 3.3 K/uL (ref 1.7–7.7)
Neutrophils Relative %: 49 %
Platelets: 324 K/uL (ref 150–400)
RBC: 4.51 MIL/uL (ref 3.87–5.11)
RDW: 12.1 % (ref 11.5–15.5)
WBC: 6.6 K/uL (ref 4.0–10.5)
nRBC: 0 % (ref 0.0–0.2)

## 2024-01-23 LAB — TROPONIN T, HIGH SENSITIVITY: Troponin T High Sensitivity: <15 ng/L (ref 0–19)

## 2024-01-23 LAB — LIPASE, BLOOD: Lipase: 11 U/L (ref 11–51)

## 2024-01-23 LAB — CBG MONITORING, ED: Glucose-Capillary: 412 mg/dL — ABNORMAL HIGH (ref 70–99)

## 2024-01-23 MED ORDER — INSULIN GLARGINE 100 UNIT/ML SOLOSTAR PEN
32.0000 [IU] | PEN_INJECTOR | Freq: Every day | SUBCUTANEOUS | 2 refills | Status: AC
  Filled 2024-01-23: qty 15, 46d supply, fill #0

## 2024-01-23 NOTE — ED Triage Notes (Signed)
 Pt caox4 ambulatory reporting her blood glucose has been high and she has been feeling more weak since she has been out of her long-acting insulin  for approx 1 wk. Also c/o indigestion x2 wks.

## 2024-01-23 NOTE — Discharge Instructions (Signed)
 Follow up with your family doc in the office when you can!

## 2024-01-23 NOTE — Care Management (Addendum)
 Transition of Care [facility]) - Emergency Department Mini Assessment   Patient Details  Name: Sarah Goodman MRN: [id number] Date of Birth: 05/10/1965  Transition of [facility]) CM/SW Contact:    [name], RN Phone Number: 01/23/2024, 10:17 AM   Clinical Narrative: Consult for medication assistance. Spoke to [name] via phone. She recently moved here from [place]  where she had medicaid. She has applied for [place] medicaid but not received it yet. Spoke to team and called [facility] pharmacy for assistance. As she has insurance we cannot do a MATCH for medication assistance. 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 [facility] pharmacy says they can put the  scripts in to [facility] outpatient and get billed later. She has confirmed this with [facility]. Messaged provider and nurse.  They are open 10-2 Called patient back, she is aware of  pharmacy hours, called [name] to make them aware, they already heard about the situation and will get started on the medication for pick up  ED Mini Assessment: What brought you to the Emergency Department? : High Sugar  Barriers to Discharge: Inadequate or no insurance  Barrier interventions: call [facility] pharmacy     Interventions which prevented an admission or readmission: Medication Review    Patient Contact and Communications        ,                 Admission diagnosis:  blood sugar high- ran out of insulin  Patient Active Problem List   Diagnosis Date Noted   Hypokalemia 02/11/2018   HTN (hypertension) 02/11/2018   Migraine 07/24/2015   Postmenopausal bleeding 04/02/2015   Nausea    Morbid obesity (HCC)    Type II diabetes mellitus (HCC)    Hive 03/18/2015   Positive D dimer 03/18/2015   Chest pressure 03/17/2015   Chest pain 03/17/2015   Chronic migraine 02/27/2015   Dysplasia of cervix, low grade (CIN 1) 08/05/2012   UTI (urinary tract infection) 05/24/2012   Dehydration  05/24/2012   GERD (gastroesophageal reflux disease) 05/24/2012   Morbid obesity with body mass index of 50.0-59.9 in adult (HCC) 05/24/2012   Vertigo 05/23/2012    Class: Acute   Chest discomfort 05/23/2012    Class: Acute   Diabetes mellitus type II, uncontrolled 05/23/2012   Abnormal uterine bleeding 12/18/2011   Low grade squamous intraepithelial lesion (LGSIL) on Pap smear 12/18/2011   PCP:  Catalina Bare, MD Pharmacy:   [facility] - [place], [place] - [address] [place] [address] Phone: [phone] Fax: [phone]  [facility] [address] [place] - [address] [place] [address] Phone: [phone] Fax: [phone]
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
## 2024-02-15 ENCOUNTER — Ambulatory Visit: Payer: Self-pay | Admitting: Family Medicine

## 2024-02-29 ENCOUNTER — Other Ambulatory Visit (HOSPITAL_COMMUNITY): Payer: Self-pay

## 2024-03-06 ENCOUNTER — Ambulatory Visit (HOSPITAL_BASED_OUTPATIENT_CLINIC_OR_DEPARTMENT_OTHER)

## 2024-03-15 ENCOUNTER — Ambulatory Visit (HOSPITAL_BASED_OUTPATIENT_CLINIC_OR_DEPARTMENT_OTHER)
